# Patient Record
Sex: Male | Born: 1950 | Race: White | Hispanic: No | Marital: Single | Smoking: Never smoker
Health system: Southern US, Community
[De-identification: ages and names within clinical notes are randomized; demographics above are authoritative.]

## PROBLEM LIST (undated history)

## (undated) DIAGNOSIS — I10 Essential (primary) hypertension: Secondary | ICD-10-CM

## (undated) DIAGNOSIS — I739 Peripheral vascular disease, unspecified: Secondary | ICD-10-CM

## (undated) DIAGNOSIS — K219 Gastro-esophageal reflux disease without esophagitis: Secondary | ICD-10-CM

## (undated) DIAGNOSIS — R9430 Abnormal result of cardiovascular function study, unspecified: Secondary | ICD-10-CM

## (undated) DIAGNOSIS — M1990 Unspecified osteoarthritis, unspecified site: Secondary | ICD-10-CM

## (undated) DIAGNOSIS — R42 Dizziness and giddiness: Secondary | ICD-10-CM

## (undated) DIAGNOSIS — T7840XA Allergy, unspecified, initial encounter: Secondary | ICD-10-CM

## (undated) DIAGNOSIS — I493 Ventricular premature depolarization: Secondary | ICD-10-CM

## (undated) DIAGNOSIS — G709 Myoneural disorder, unspecified: Secondary | ICD-10-CM

## (undated) DIAGNOSIS — E785 Hyperlipidemia, unspecified: Secondary | ICD-10-CM

## (undated) DIAGNOSIS — M791 Myalgia, unspecified site: Secondary | ICD-10-CM

## (undated) DIAGNOSIS — IMO0002 Reserved for concepts with insufficient information to code with codable children: Secondary | ICD-10-CM

## (undated) DIAGNOSIS — R21 Rash and other nonspecific skin eruption: Secondary | ICD-10-CM

## (undated) DIAGNOSIS — A059 Bacterial foodborne intoxication, unspecified: Secondary | ICD-10-CM

## (undated) DIAGNOSIS — I358 Other nonrheumatic aortic valve disorders: Secondary | ICD-10-CM

## (undated) DIAGNOSIS — Z951 Presence of aortocoronary bypass graft: Secondary | ICD-10-CM

## (undated) DIAGNOSIS — I779 Disorder of arteries and arterioles, unspecified: Secondary | ICD-10-CM

## (undated) DIAGNOSIS — I2510 Atherosclerotic heart disease of native coronary artery without angina pectoris: Secondary | ICD-10-CM

## (undated) HISTORY — DX: Gastro-esophageal reflux disease without esophagitis: K21.9

## (undated) HISTORY — PX: POLYPECTOMY: SHX149

## (undated) HISTORY — DX: Peripheral vascular disease, unspecified: I73.9

## (undated) HISTORY — DX: Myoneural disorder, unspecified: G70.9

## (undated) HISTORY — PX: BACK SURGERY: SHX140

## (undated) HISTORY — DX: Reserved for concepts with insufficient information to code with codable children: IMO0002

## (undated) HISTORY — PX: COLONOSCOPY: SHX174

## (undated) HISTORY — PX: OTHER SURGICAL HISTORY: SHX169

## (undated) HISTORY — DX: Myalgia, unspecified site: M79.10

## (undated) HISTORY — DX: Disorder of arteries and arterioles, unspecified: I77.9

## (undated) HISTORY — PX: TIBIA FRACTURE SURGERY: SHX806

## (undated) HISTORY — DX: Hyperlipidemia, unspecified: E78.5

## (undated) HISTORY — DX: Essential (primary) hypertension: I10

## (undated) HISTORY — DX: Ventricular premature depolarization: I49.3

## (undated) HISTORY — DX: Allergy, unspecified, initial encounter: T78.40XA

## (undated) HISTORY — DX: Presence of aortocoronary bypass graft: Z95.1

## (undated) HISTORY — DX: Unspecified osteoarthritis, unspecified site: M19.90

## (undated) HISTORY — DX: Abnormal result of cardiovascular function study, unspecified: R94.30

## (undated) HISTORY — DX: Other nonrheumatic aortic valve disorders: I35.8

## (undated) HISTORY — DX: Dizziness and giddiness: R42

## (undated) HISTORY — DX: Bacterial foodborne intoxication, unspecified: A05.9

## (undated) HISTORY — DX: Atherosclerotic heart disease of native coronary artery without angina pectoris: I25.10

## (undated) HISTORY — DX: Rash and other nonspecific skin eruption: R21

---

## 1999-04-01 ENCOUNTER — Ambulatory Visit (HOSPITAL_COMMUNITY): Admission: RE | Admit: 1999-04-01 | Discharge: 1999-04-01 | Payer: Self-pay | Admitting: Internal Medicine

## 1999-04-01 ENCOUNTER — Encounter: Payer: Self-pay | Admitting: Internal Medicine

## 2000-09-07 ENCOUNTER — Encounter: Admission: RE | Admit: 2000-09-07 | Discharge: 2000-12-06 | Payer: Self-pay | Admitting: Anesthesiology

## 2003-01-22 ENCOUNTER — Inpatient Hospital Stay (HOSPITAL_COMMUNITY): Admission: AD | Admit: 2003-01-22 | Discharge: 2003-01-24 | Payer: Self-pay | Admitting: *Deleted

## 2003-01-22 ENCOUNTER — Encounter: Payer: Self-pay | Admitting: *Deleted

## 2004-09-18 ENCOUNTER — Ambulatory Visit: Payer: Self-pay | Admitting: Family Medicine

## 2005-04-09 ENCOUNTER — Ambulatory Visit: Payer: Self-pay | Admitting: Internal Medicine

## 2005-04-23 ENCOUNTER — Ambulatory Visit: Payer: Self-pay | Admitting: Internal Medicine

## 2005-06-04 ENCOUNTER — Ambulatory Visit: Payer: Self-pay | Admitting: Internal Medicine

## 2005-09-15 ENCOUNTER — Ambulatory Visit: Payer: Self-pay | Admitting: Internal Medicine

## 2005-11-04 ENCOUNTER — Ambulatory Visit: Payer: Self-pay | Admitting: Internal Medicine

## 2005-11-12 ENCOUNTER — Ambulatory Visit: Payer: Self-pay | Admitting: Internal Medicine

## 2005-12-04 ENCOUNTER — Ambulatory Visit: Payer: Self-pay | Admitting: Internal Medicine

## 2005-12-07 ENCOUNTER — Ambulatory Visit: Payer: Self-pay | Admitting: *Deleted

## 2005-12-29 ENCOUNTER — Ambulatory Visit: Payer: Self-pay

## 2006-01-20 ENCOUNTER — Ambulatory Visit: Payer: Self-pay | Admitting: Internal Medicine

## 2006-06-04 ENCOUNTER — Ambulatory Visit: Payer: Self-pay | Admitting: Internal Medicine

## 2006-06-04 LAB — CONVERTED CEMR LAB
Basophils Absolute: 0 10*3/uL (ref 0.0–0.1)
Basophils Relative: 0.3 % (ref 0.0–1.0)
Calcium: 9.6 mg/dL (ref 8.4–10.5)
Creatinine,U: 183.8 mg/dL
Eosinophil percent: 2.9 % (ref 0.0–5.0)
Free T4: 0.8 ng/dL — ABNORMAL LOW (ref 0.9–1.8)
HCT: 44.4 % (ref 39.0–52.0)
Hemoglobin: 15.2 g/dL (ref 13.0–17.0)
Hgb A1c MFr Bld: 5.9 % (ref 4.6–6.0)
Lymphocytes Relative: 26 % (ref 12.0–46.0)
MCHC: 34.3 g/dL (ref 30.0–36.0)
MCV: 90.5 fL (ref 78.0–100.0)
Magnesium: 2.1 mg/dL (ref 1.5–2.5)
Microalb Creat Ratio: 5.4 mg/g (ref 0.0–30.0)
Microalb, Ur: 1 mg/dL (ref 0.0–1.9)
Monocytes Absolute: 0.5 10*3/uL (ref 0.2–0.7)
Monocytes Relative: 10.6 % (ref 3.0–11.0)
Neutro Abs: 3.1 10*3/uL (ref 1.4–7.7)
Neutrophils Relative %: 60.2 % (ref 43.0–77.0)
Platelets: 180 10*3/uL (ref 150–400)
Potassium: 4.2 meq/L (ref 3.5–5.1)
RBC: 4.91 M/uL (ref 4.22–5.81)
RDW: 12.5 % (ref 11.5–14.6)
TSH: 1.42 microintl units/mL (ref 0.35–5.50)
WBC: 5 10*3/uL (ref 4.5–10.5)

## 2006-09-15 ENCOUNTER — Ambulatory Visit: Payer: Self-pay | Admitting: *Deleted

## 2006-09-15 LAB — CONVERTED CEMR LAB
ALT: 27 U/L (ref 0–40)
AST: 22 U/L (ref 0–37)
Albumin: 4.1 g/dL (ref 3.5–5.2)
Alkaline Phosphatase: 33 U/L — ABNORMAL LOW (ref 39–117)
BUN: 12 mg/dL (ref 6–23)
Bilirubin, Direct: 0.1 mg/dL (ref 0.0–0.3)
CO2: 27 meq/L (ref 19–32)
Calcium: 9.1 mg/dL (ref 8.4–10.5)
Chloride: 104 meq/L (ref 96–112)
Cholesterol: 227 mg/dL (ref 0–200)
Creatinine, Ser: 0.7 mg/dL (ref 0.4–1.5)
Direct LDL: 158.9 mg/dL
GFR calc Af Amer: 151 mL/min
GFR calc non Af Amer: 125 mL/min
Glucose, Bld: 123 mg/dL — ABNORMAL HIGH (ref 70–99)
HDL: 32.3 mg/dL — ABNORMAL LOW (ref >39.0)
Potassium: 4 meq/L (ref 3.5–5.1)
Sodium: 140 meq/L (ref 135–145)
Total Bilirubin: 1.2 mg/dL (ref 0.3–1.2)
Total CHOL/HDL Ratio: 7
Total Protein: 6.9 g/dL (ref 6.0–8.3)
Triglycerides: 178 mg/dL — ABNORMAL HIGH (ref 0–149)
VLDL: 36 mg/dL (ref 0–40)

## 2006-11-12 ENCOUNTER — Ambulatory Visit: Payer: Self-pay | Admitting: *Deleted

## 2006-11-12 LAB — CONVERTED CEMR LAB
ALT: 31 units/L (ref 0–40)
AST: 23 units/L (ref 0–37)
Albumin: 4.1 g/dL (ref 3.5–5.2)
Alkaline Phosphatase: 33 units/L — ABNORMAL LOW (ref 39–117)
Bilirubin, Direct: 0.1 mg/dL (ref 0.0–0.3)
Cholesterol: 149 mg/dL (ref 0–200)
HDL: 39 mg/dL (ref >39.0)
LDL Cholesterol: 89 mg/dL (ref 0–99)
Total Bilirubin: 1 mg/dL (ref 0.3–1.2)
Total CHOL/HDL Ratio: 3.8
Total Protein: 7.1 g/dL (ref 6.0–8.3)
Triglycerides: 107 mg/dL (ref 0–149)
VLDL: 21 mg/dL (ref 0–40)

## 2007-01-11 ENCOUNTER — Ambulatory Visit: Payer: Self-pay | Admitting: Cardiology

## 2007-08-24 ENCOUNTER — Ambulatory Visit: Payer: Self-pay | Admitting: Internal Medicine

## 2007-11-07 ENCOUNTER — Encounter: Payer: Self-pay | Admitting: Internal Medicine

## 2007-11-28 ENCOUNTER — Encounter: Payer: Self-pay | Admitting: Internal Medicine

## 2007-12-14 ENCOUNTER — Ambulatory Visit: Payer: Self-pay | Admitting: Internal Medicine

## 2007-12-14 LAB — CONVERTED CEMR LAB
ALT: 26 units/L (ref 0–53)
AST: 20 units/L (ref 0–37)
Albumin: 4.1 g/dL (ref 3.5–5.2)
Alkaline Phosphatase: 27 units/L — ABNORMAL LOW (ref 39–117)
BUN: 8 mg/dL (ref 6–23)
Bilirubin, Direct: 0.1 mg/dL (ref 0.0–0.3)
Cholesterol: 152 mg/dL (ref 0–200)
Creatinine, Ser: 0.8 mg/dL (ref 0.4–1.5)
Creatinine,U: 111.5 mg/dL
HDL: 30.3 mg/dL — ABNORMAL LOW (ref >39.0)
Hgb A1c MFr Bld: 6.5 % — ABNORMAL HIGH (ref 4.6–6.0)
LDL Cholesterol: 96 mg/dL (ref 0–99)
Microalb Creat Ratio: 9 mg/g (ref 0.0–30.0)
Microalb, Ur: 1 mg/dL (ref 0.0–1.9)
Potassium: 4.4 meq/L (ref 3.5–5.1)
Total Bilirubin: 0.8 mg/dL (ref 0.3–1.2)
Total CHOL/HDL Ratio: 5
Total Protein: 6.8 g/dL (ref 6.0–8.3)
Triglycerides: 127 mg/dL (ref 0–149)
VLDL: 25 mg/dL (ref 0–40)

## 2007-12-20 ENCOUNTER — Ambulatory Visit: Payer: Self-pay | Admitting: Internal Medicine

## 2007-12-20 LAB — CONVERTED CEMR LAB
Cholesterol, target level: 200 mg/dL
HDL goal, serum: 40 mg/dL
LDL Goal: 130 mg/dL

## 2007-12-22 ENCOUNTER — Encounter (INDEPENDENT_AMBULATORY_CARE_PROVIDER_SITE_OTHER): Payer: Self-pay | Admitting: *Deleted

## 2007-12-22 ENCOUNTER — Encounter: Payer: Self-pay | Admitting: Internal Medicine

## 2007-12-28 ENCOUNTER — Telehealth (INDEPENDENT_AMBULATORY_CARE_PROVIDER_SITE_OTHER): Payer: Self-pay | Admitting: *Deleted

## 2007-12-29 ENCOUNTER — Ambulatory Visit: Payer: Self-pay | Admitting: Cardiology

## 2008-01-24 ENCOUNTER — Ambulatory Visit: Payer: Self-pay

## 2008-01-24 ENCOUNTER — Encounter: Payer: Self-pay | Admitting: Cardiology

## 2008-01-26 ENCOUNTER — Telehealth (INDEPENDENT_AMBULATORY_CARE_PROVIDER_SITE_OTHER): Payer: Self-pay | Admitting: *Deleted

## 2008-01-30 ENCOUNTER — Ambulatory Visit: Payer: Self-pay | Admitting: Cardiology

## 2008-03-27 ENCOUNTER — Ambulatory Visit: Payer: Self-pay | Admitting: Internal Medicine

## 2008-03-27 DIAGNOSIS — E1165 Type 2 diabetes mellitus with hyperglycemia: Secondary | ICD-10-CM | POA: Insufficient documentation

## 2008-03-27 DIAGNOSIS — E1151 Type 2 diabetes mellitus with diabetic peripheral angiopathy without gangrene: Secondary | ICD-10-CM

## 2008-03-27 DIAGNOSIS — I10 Essential (primary) hypertension: Secondary | ICD-10-CM | POA: Insufficient documentation

## 2008-03-28 LAB — CONVERTED CEMR LAB
ALT: 27 units/L (ref 0–53)
AST: 19 units/L (ref 0–37)
Albumin: 4 g/dL (ref 3.5–5.2)
Alkaline Phosphatase: 32 units/L — ABNORMAL LOW (ref 39–117)
Bilirubin, Direct: 0.1 mg/dL (ref 0.0–0.3)
Cholesterol: 153 mg/dL (ref 0–200)
HDL: 27.8 mg/dL — ABNORMAL LOW (ref >39.0)
Hgb A1c MFr Bld: 5.6 % (ref 4.6–6.0)
LDL Cholesterol: 93 mg/dL (ref 0–99)
Total Bilirubin: 0.7 mg/dL (ref 0.3–1.2)
Total CHOL/HDL Ratio: 5.5
Total Protein: 6.9 g/dL (ref 6.0–8.3)
Triglycerides: 161 mg/dL — ABNORMAL HIGH (ref 0–149)
VLDL: 32 mg/dL (ref 0–40)

## 2008-09-12 ENCOUNTER — Ambulatory Visit: Payer: Self-pay | Admitting: Cardiology

## 2008-12-20 ENCOUNTER — Ambulatory Visit: Payer: Self-pay | Admitting: Family Medicine

## 2008-12-21 ENCOUNTER — Telehealth: Payer: Self-pay | Admitting: Internal Medicine

## 2009-02-14 ENCOUNTER — Telehealth: Payer: Self-pay | Admitting: Cardiology

## 2009-02-19 ENCOUNTER — Ambulatory Visit: Payer: Self-pay | Admitting: Internal Medicine

## 2009-04-09 ENCOUNTER — Telehealth (INDEPENDENT_AMBULATORY_CARE_PROVIDER_SITE_OTHER): Payer: Self-pay | Admitting: *Deleted

## 2009-04-15 ENCOUNTER — Encounter (INDEPENDENT_AMBULATORY_CARE_PROVIDER_SITE_OTHER): Payer: Self-pay | Admitting: *Deleted

## 2009-04-30 ENCOUNTER — Telehealth: Payer: Self-pay | Admitting: Cardiology

## 2009-05-01 ENCOUNTER — Ambulatory Visit: Payer: Self-pay | Admitting: Internal Medicine

## 2009-05-05 LAB — CONVERTED CEMR LAB
ALT: 25 units/L (ref 0–53)
AST: 19 units/L (ref 0–37)
Albumin: 4.2 g/dL (ref 3.5–5.2)
Alkaline Phosphatase: 37 units/L — ABNORMAL LOW (ref 39–117)
BUN: 10 mg/dL (ref 6–23)
Bilirubin, Direct: 0 mg/dL (ref 0.0–0.3)
CO2: 28 meq/L (ref 19–32)
Calcium: 9.2 mg/dL (ref 8.4–10.5)
Chloride: 107 meq/L (ref 96–112)
Cholesterol: 207 mg/dL — ABNORMAL HIGH (ref 0–200)
Creatinine, Ser: 0.7 mg/dL (ref 0.4–1.5)
Creatinine,U: 98.6 mg/dL
Direct LDL: 156.1 mg/dL
GFR calc non Af Amer: 123.62 mL/min (ref >60)
Glucose, Bld: 139 mg/dL — ABNORMAL HIGH (ref 70–99)
HDL: 33.1 mg/dL — ABNORMAL LOW (ref >39.00)
Hgb A1c MFr Bld: 5.5 % (ref 4.6–6.5)
Microalb Creat Ratio: 4.1 mg/g (ref 0.0–30.0)
Microalb, Ur: 0.4 mg/dL (ref 0.0–1.9)
Potassium: 4.4 meq/L (ref 3.5–5.1)
Sodium: 140 meq/L (ref 135–145)
Total Bilirubin: 0.8 mg/dL (ref 0.3–1.2)
Total CHOL/HDL Ratio: 6
Total Protein: 6.9 g/dL (ref 6.0–8.3)
Triglycerides: 155 mg/dL — ABNORMAL HIGH (ref 0.0–149.0)
VLDL: 31 mg/dL (ref 0.0–40.0)

## 2009-05-06 ENCOUNTER — Encounter (INDEPENDENT_AMBULATORY_CARE_PROVIDER_SITE_OTHER): Payer: Self-pay | Admitting: *Deleted

## 2009-05-08 ENCOUNTER — Encounter (INDEPENDENT_AMBULATORY_CARE_PROVIDER_SITE_OTHER): Payer: Self-pay | Admitting: *Deleted

## 2009-06-03 ENCOUNTER — Telehealth (INDEPENDENT_AMBULATORY_CARE_PROVIDER_SITE_OTHER): Payer: Self-pay | Admitting: *Deleted

## 2009-06-03 DIAGNOSIS — I493 Ventricular premature depolarization: Secondary | ICD-10-CM | POA: Insufficient documentation

## 2009-06-03 DIAGNOSIS — R0989 Other specified symptoms and signs involving the circulatory and respiratory systems: Secondary | ICD-10-CM | POA: Insufficient documentation

## 2009-06-03 HISTORY — DX: Ventricular premature depolarization: I49.3

## 2009-06-04 ENCOUNTER — Ambulatory Visit: Payer: Self-pay | Admitting: Cardiology

## 2009-06-12 ENCOUNTER — Ambulatory Visit: Payer: Self-pay

## 2009-06-12 ENCOUNTER — Encounter: Payer: Self-pay | Admitting: Cardiology

## 2009-07-30 ENCOUNTER — Ambulatory Visit: Payer: Self-pay | Admitting: Cardiology

## 2009-08-07 LAB — CONVERTED CEMR LAB
Cholesterol: 159 mg/dL (ref 0–200)
HDL: 33.5 mg/dL — ABNORMAL LOW (ref >39.00)
LDL Cholesterol: 104 mg/dL — ABNORMAL HIGH (ref 0–99)
Total CHOL/HDL Ratio: 5
Triglycerides: 107 mg/dL (ref 0.0–149.0)
VLDL: 21.4 mg/dL (ref 0.0–40.0)

## 2009-09-02 ENCOUNTER — Encounter (INDEPENDENT_AMBULATORY_CARE_PROVIDER_SITE_OTHER): Payer: Self-pay | Admitting: *Deleted

## 2009-09-04 ENCOUNTER — Ambulatory Visit: Payer: Self-pay | Admitting: Cardiology

## 2009-09-05 LAB — CONVERTED CEMR LAB
BUN: 11 mg/dL (ref 6–23)
CO2: 27 meq/L (ref 19–32)
Calcium: 9.1 mg/dL (ref 8.4–10.5)
Chloride: 104 meq/L (ref 96–112)
Creatinine, Ser: 0.8 mg/dL (ref 0.4–1.5)
GFR calc non Af Amer: 105.83 mL/min (ref >60)
Glucose, Bld: 136 mg/dL — ABNORMAL HIGH (ref 70–99)
Potassium: 4.1 meq/L (ref 3.5–5.1)
Sodium: 139 meq/L (ref 135–145)
Total CK: 193 units/L (ref 7–232)

## 2009-09-06 ENCOUNTER — Ambulatory Visit: Payer: Self-pay | Admitting: Internal Medicine

## 2009-09-23 ENCOUNTER — Encounter: Payer: Self-pay | Admitting: Cardiology

## 2009-11-07 ENCOUNTER — Encounter: Payer: Self-pay | Admitting: Cardiology

## 2009-11-08 ENCOUNTER — Ambulatory Visit: Payer: Self-pay | Admitting: Cardiology

## 2010-01-17 ENCOUNTER — Encounter: Payer: Self-pay | Admitting: Internal Medicine

## 2010-02-20 ENCOUNTER — Ambulatory Visit: Payer: Self-pay | Admitting: Internal Medicine

## 2010-02-24 ENCOUNTER — Telehealth: Payer: Self-pay | Admitting: Internal Medicine

## 2010-02-28 LAB — CONVERTED CEMR LAB
Hgb A1c MFr Bld: 6.6 % — ABNORMAL HIGH (ref 4.6–6.5)
Microalb Creat Ratio: 1.6 mg/g (ref 0.0–30.0)

## 2010-04-15 ENCOUNTER — Telehealth (INDEPENDENT_AMBULATORY_CARE_PROVIDER_SITE_OTHER): Payer: Self-pay | Admitting: *Deleted

## 2010-04-16 ENCOUNTER — Telehealth (INDEPENDENT_AMBULATORY_CARE_PROVIDER_SITE_OTHER): Payer: Self-pay | Admitting: *Deleted

## 2010-05-21 ENCOUNTER — Ambulatory Visit: Payer: Self-pay | Admitting: Internal Medicine

## 2010-05-21 DIAGNOSIS — M25569 Pain in unspecified knee: Secondary | ICD-10-CM | POA: Insufficient documentation

## 2010-09-02 NOTE — Assessment & Plan Note (Signed)
Summary: discuss med refills//lh   Vital Signs:  Patient profile:   60 year old male Height:      66 inches Weight:      251 pounds Temp:     98.7 degrees F oral Pulse rate:   60 / minute Resp:     16 per minute BP sitting:   122 / 74  (left arm) Cuff size:   large  Vitals Entered By: Shonna Chock (September 06, 2009 4:21 PM) CC: Yearly follow-up on meds Comments REVIEWED MED LIST, PATIENT AGREED DOSE AND INSTRUCTION CORRECT    Primary Care Provider:  Marga Melnick MD  CC:  Yearly follow-up on meds.  History of Present Illness: Dr Henrietta Hoover OV 09/04/2009  reviewed ; the myalgias are better off Crestor. He was experiencing fatigue, pain in toes & R hip, & sensation of skin rash w/o visable change... "my skin was crawling". CPK  was WNL @ 193. Chemistries were WNL except ; non fasting glucose was 136. FBS 120-130; no post meal glucoses. No hypoglycemia. No diet; no significant CVE. Weight  stable.  Allergies: 1)  ! 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  and courses between the pulmonary artery and aorta anterior to the aorta EF   55%.... catheterization.... January 24, 2003. /  60%.. echo... June, 2009 Aortic valve sclerosis.[name]. echo... June, 2009 PVCs   EKG... November, 2010 Carotid bruit... Doppler,  November, 2010...zero-39% bilateral disease GERD  Past Surgical History: coccyx bone removed pilonidal cystectomy back surgery stent 2004  Family History: Family History Hypertension father MI in 88s, bypass, htn pgf MI died age 65 mgf cancer site unknown paternal aunt diabetes mgm heart failure age 65  Review of Systems Eyes:   Denies blurring, double vision, and vision loss-both eyes; Last exam 5 months ago; no retinopathy. CV:  Denies chest pain or discomfort, leg cramps with exertion, lightheadness, near fainting, shortness of breath with exertion, swelling of feet, and swelling of hands. GI:  Denies abdominal pain, bloody stools, dark tarry stools, and indigestion; No colonscopy to date ; [facility] reviewed. Derm:  Denies lesion(s) and poor wound healing. Neuro:  Denies numbness and tingling. Endo:  Denies excessive hunger, excessive thirst, and excessive urination.  Physical Exam  General:  well-nourished,in no acute distress; alert,appropriate and cooperative throughout examination Head:  Normocephalic and atraumatic without obvious abnormalities. Pattern  alopecia . Neck:  No deformities, masses, or tenderness noted. Lungs:  Normal respiratory effort, chest expands symmetrically. Lungs are clear to auscultation, no crackles or wheezes. Heart:  normal rate, regular rhythm, no gallop, no rub, no JVD, no HJR, and grade 1 /6 systolic murmur.   Abdomen:  Bowel sounds positive,abdomen soft and non-tender without masses, organomegaly or hernias noted.  Dullness RUQ Rectal:  No external abnormalities noted. Normal sphincter tone. No rectal masses or tenderness. Genitalia:  Testes bilaterally descended without nodularity, tenderness or masses. No scrotal masses or lesions. No penis lesions or urethral discharge. Prostate:  Prostate gland firm and smooth, no enlargement, nodularity, tenderness, mass, asymmetry or induration. Pulses:  R and L carotid,radial,dorsalis pedis and posterior tibial pulses are full and equal bilaterally Extremities:  No clubbing, cyanosis, edema, or deformity noted. Good nail health Neurologic:  alert & oriented X3 and sensation intact to light touch  over feet.   Skin:  Intact without suspicious lesions or rashes Cervical Nodes:  No lymphadenopathy noted Axillary Nodes:  No palpable  lymphadenopathy Psych:  memory intact for recent and remote, normally interactive, and good eye contact.     Impression & Recommendations:  Problem # 1:  DIABETES MELLITUS, TYPE II (ICD-250.00)  His updated medication list for this problem includes:    Actoplus Met 15-850 Mg Tabs (Pioglitazone hcl-metformin hcl) .[name]... Take one tablet twice daily- appt due now    Ramipril 10 Mg Caps (Ramipril) .[name]... 1 by mouth once daily-appt due now    Aspirin 81 Mg Tbec (Aspirin) .[name]... Take one tablet by mouth daily    Glimepiride 1 Mg Tabs (Glimepiride) .[name]... 1 q am-appt due now  Problem # 2:  HYPERTENSION (ICD-401.9)  His updated medication list for this problem includes:    Ramipril 10 Mg Caps (Ramipril) .[name]... 1 by mouth once daily-appt due now  Problem # 3:  MUSCLE PAIN (ICD-729.1)  normal CPK   His updated medication list for this problem includes:    Aspirin 81 Mg Tbec (Aspirin) .[name]... Take one tablet by mouth daily  Problem # 4:  HYPERLIPIDEMIA (ICD-272.4) now off statins  Complete Medication List: 1)  Actoplus Met 15-850 Mg Tabs (Pioglitazone hcl-metformin hcl) .... Take one tablet twice daily- appt due now 2)  Plavix 75 Mg Tabs (Clopidogrel bisulfate) .[name].. 1 by mouth qd 3)  Ramipril 10 Mg Caps (Ramipril) .[name].. 1 by mouth once daily-appt due now 4)  Aspirin 81 Mg Tbec (Aspirin) .... Take one tablet by mouth daily 5)  Glimepiride 1 Mg Tabs (Glimepiride) .[name].. 1 q am-appt due now 6)  Freestyle Lite Test Strp (Glucose blood) .... Use as directed 7)  Fish Oil 1000 Mg Caps (Omega-3 fatty acids) .[name].. 1 cap once daily  Patient Instructions: 1)  Eat BROWN CARBS rather white carbs. Consume LESS THAN 40 grams of sugar/ day from foods & drinks with High Fructose Corn Syrup as #1,2 ,or # 3 on label. 2)  Please schedule a follow-up appointment in 4 months. 3)  HbgA1C prior to visit, ICD-9: 4)  Urine Microalbumin prior to visit, ICD-9: 5)  Schedule a colonoscopy  to help detect colon cancer if OK  with Dr Myrtis Ser. Prescriptions: ACTOPLUS MET 15-850 MG TABS (PIOGLITAZONE HCL-METFORMIN HCL) take one tablet twice daily- APPT DUE NOW  #60 Tablet x 3   Entered and Authorized by:   Marga Melnick MD   Signed by:   Marga Melnick MD on 09/06/2009   Method used:   [facility] to ...       [facility]  [address] [id number]       [address]       [place], [place]  [address]       Ph: [phone] or [phone]       Fax: [phone]   RxID:   [id number]

## 2010-09-02 NOTE — Progress Notes (Signed)
Summary: Refill Request  Phone Note Refill Request Message from:  Pharmacy  Refills Requested: Medication #1:  ACTOPLUS MET 15-850 MG TABS take one tablet twice daily [facility]    Method Requested: Fax to Mail Away Pharmacy Initial call taken by: Shonna Chock CMA,  April 15, 2010 8:52 AM    Prescriptions: ACTOPLUS MET 15-850 MG TABS (PIOGLITAZONE HCL-METFORMIN HCL) take one tablet twice daily  #180 x 1   Entered by:   Shonna Chock CMA   Authorized by:   Marga Melnick MD   Signed by:   Shonna Chock CMA on 04/15/2010   Method used:   Faxed to ...       [facility] (mail-order)             , [place]         Ph: [phone]       Fax: [phone]   RxID:   [id number]

## 2010-09-02 NOTE — Progress Notes (Signed)
Summary: Medicine  Phone Note Call from Patient Call back at Work Phone [phone]   Caller: Patient Summary of Call: Patient called and said that he talked with his insurance and they said it was ok to call in a 7 day supply [facility] the [facility] [address] and they will cover that while he waits for his mail order. He lives 45 minutes away from [place] and this would be eaiser then coming to get the samples.  Initial call taken by: Bodi Barban,  April 16, 2010 2:14 PM  Follow-up for Phone Call        Called in a 7 days supply to the pharmacy Follow-up by: Shonna Chock CMA,  April 16, 2010 2:27 PM

## 2010-09-02 NOTE — Assessment & Plan Note (Signed)
Summary: LEFT KNEE IS SORE X6 MONTHS/KN   Vital Signs:  Patient profile:   60 year old male Weight:      244 pounds BMI:     39.52 Temp:     98.7 degrees F oral Pulse rate:   72 / minute Resp:     15 per minute BP sitting:   132 / 80  (left arm) Cuff size:   regular  Vitals Entered By: Shonna Chock CMA (May 21, 2010 8:10 AM) CC: 1.) Left knee concerns   2.) Sinus Issues, Lower Extremity Joint pain, URI symptoms   Primary Care Provider:  Marga Melnick MD  CC:  1.) Left knee concerns   2.) Sinus Issues, Lower Extremity Joint pain, and URI symptoms.  History of Present Illness:    He has had lower Extremity discomfort &  weakness, present months - a year, progressively worse over several months. The patient reports giving away, occasional  popping, and weakness, but denies swelling, redness, locking, stiffness for >1 hr, and decreased ROM.  The pain is located in the left knee in the popliteal space.  This  began gradually and with no injury.  It  is described as constant and activity related, worse getting up from seated position.It resolves within 2-3 steps. He walked 3.5  miles last week w/o issues.There has been no evaluation to date.  The patient denies the following symptoms: fever, rash, photosensitivity, eye symptoms, diarrhea, and dysuria. Rx: none. PMH of fracture LLE; it is 3/8" shorter than R. URI Symptoms      The patient also presents with URI symptoms X 3 days, onset as ST.  The patient reports nasal congestion,  scant purulent nasal discharge,  and productive cough from PNDrainage (  light green-grey), but denies earache ,fever, headache or bilateral facial pain, tooth pain, and tender adenopathy.  Rx: none.  Current Medications (verified): 1)  Actoplus Met 15-850 Mg Tabs (Pioglitazone Hcl-Metformin Hcl) .... Take One Tablet Twice Daily 2)  Plavix 75 Mg  Tabs (Clopidogrel Bisulfate) .[name].. 1 By Mouth Qd 3)  Ramipril 10 Mg Caps (Ramipril) .[name].. 1 By Mouth Once Daily 4)   Aspirin 81 Mg Tbec (Aspirin) .... Take One Tablet By Mouth Daily 5)  Glimepiride 1 Mg  Tabs (Glimepiride) .[name].. 1 By Mouth Once Daily 6)  Freestyle Lite Test   Strp (Glucose Blood) .... Use As Directed 7)  Fish Oil 1000 Mg Caps (Omega-3 Fatty Acids) .[name].. 1 Cap Once Daily 8)  Simvastatin 20 Mg Tabs (Simvastatin) .... Take 1 Tablet At Bedtime  Allergies: 1)  ! Crestor (Rosuvastatin Calcium)  Physical Exam  General:  well-nourished,in no acute distress; alert,appropriate and cooperative throughout examination Ears:  External ear exam shows no significant lesions or deformities.  Otoscopic examination reveals clear canals, tympanic membranes are intact bilaterally without bulging, retraction, inflammation or discharge. Hearing is grossly normal bilaterally. Nose:  External nasal examination shows no deformity or inflammation. Nasal mucosa are pink and moist without lesions or exudates.Slight septal dislocation Mouth:  Oral mucosa and oropharynx without lesions or exudates.  Teeth in good repair. Lungs:  Normal respiratory effort, chest expands symmetrically. Lungs are clear to auscultation, no crackles or wheezes.Dry cough Heart:  normal rate, regular rhythm, no gallop, no rub, and grade 1 /6 systolic murmur.   Extremities:  No clubbing, cyanosis, edema, or deformity noted with normal full range of motion of  knees. Crepitus L knee w/o effusion Neurologic:  strength normal in all extremities and DTRs symmetrical  and normal.   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 R/O cartilage "feather"  being wedge His updated medication list for this problem includes:    Aspirin 81 Mg Tbec (Aspirin) .[name]... Take one tablet by mouth daily    Tramadol Hcl 50 Mg Tabs (Tramadol hcl) .[name]... 1 every 6 hrs as needed for pain  Orders: Orthopedic  Referral (Ortho)  Problem # 2:  URI (ICD-465.9)  His updated medication list for this problem includes:    Aspirin 81 Mg Tbec (Aspirin) .[name]... Take one tablet by mouth daily  Complete Medication List: 1)  Actoplus Met 15-850 Mg Tabs (Pioglitazone hcl-metformin hcl) .... Take one tablet twice daily 2)  Plavix 75 Mg Tabs (Clopidogrel bisulfate) .[name].. 1 by mouth qd 3)  Ramipril 10 Mg Caps (Ramipril) .[name].. 1 by mouth once daily 4)  Aspirin 81 Mg Tbec (Aspirin) .... Take one tablet by mouth daily 5)  Glimepiride 1 Mg Tabs (Glimepiride) .[name].. 1 by mouth once daily 6)  Freestyle Lite Test Strp (Glucose blood) .... Use as directed 7)  Fish Oil 1000 Mg Caps (Omega-3 fatty acids) .[name].. 1 cap once daily 8)  Simvastatin 20 Mg Tabs (Simvastatin) .... Take 1 tablet at bedtime 9)  Amoxicillin 500 Mg Caps (Amoxicillin) .[name].. 1 three times a day 10)  Tramadol Hcl 50 Mg Tabs (Tramadol hcl) .[name].. 1 every 6 hrs as needed for pain  Patient Instructions: 1)  Neti pot once daily until head congestion  resolved.  2)  Drink as much  NON dairy fluid as you can tolerate for the next few days. Prescriptions: TRAMADOL HCL 50 MG TABS (TRAMADOL HCL) 1 every 6 hrs as needed for pain  #30 x 0   Entered and Authorized by:   Marga Melnick MD   Signed by:   Marga Melnick MD on 05/21/2010   Method used:   [facility]ed to ...       [facility]  [address] [address] [id number]       [address]       [place], [place]  [address]       Ph: [phone] or [phone]       Fax: [phone]   RxID:   [id number] AMOXICILLIN 500 MG CAPS (AMOXICILLIN) 1 three times a day  #30 x 0   Entered and Authorized by:   Marga Melnick MD   Signed by:   Marga Melnick MD on 05/21/2010   Method used:   Faxed to ...       [facility]  [address] [id number]       [address]       [place], [place]  [address]       Ph: [phone] or [phone]       Fax: [phone]   RxID:    [id number]    Orders Added: 1)  Est. Patient Level IV [[address]] 2)  Orthopedic Referral [Ortho]

## 2010-09-02 NOTE — Progress Notes (Signed)
Summary: pt out of med - do we have samples  Phone Note Call from Patient Call back at Work Phone [phone] Call back at [phone]   Caller: Patient Summary of Call: patient needs to use mail order - he is out of med 2 days - do we have samples -ACTOPLUS MET 15-850 MG TABS - told patient we can call 30 day supply in to local pharmacy - he is concerned if they will approve - he is contacting ins  Initial call taken by: Okey Regal [place],  April 16, 2010 9:48 AM  Follow-up for Phone Call        Patient aware 2 weeks worth of samples placed at the front, patient awaiting mail order Follow-up by: Shonna Chock CMA,  April 16, 2010 12:58 PM

## 2010-09-02 NOTE — Progress Notes (Signed)
Summary: Request to speak with Dr  Phone Note Call from Patient Call back at Work Phone [phone]   Caller: Patient Summary of Call: Message left on VM: Patient would like for Dr.Hopper to call him, this is a personal call. Nothing relating to [place].   Shonna Chock CMA  February 24, 2010 1:19 PM   Follow-up for Phone Call        answered ; he was relaying information about an associate Follow-up by: Marga Melnick MD,  February 25, 2010 3:57 PM

## 2010-09-02 NOTE — Letter (Signed)
Summary: Generic Letter  [facility], Main Office  [address]   [place], [place] [address]   Phone: [phone]  Fax: [phone]        September 23, 2009 MRN: [id number]    [facility] [address] [place], [place]  [address]    Dear [name] [name],  Our records indicate it is time to check your cholesterol, since you changed medications in December.  Please call our office to schedule an appt for labwork.  Please remember it is a fasting lab.     Sincerely,  Meredith Staggers, RN Willa Rough, MD  This letter has been electronically signed by your physician.

## 2010-09-02 NOTE — Assessment & Plan Note (Signed)
Summary: 3 month/dmp  Medications Added FISH OIL 1000 MG CAPS (OMEGA-3 FATTY ACIDS) 1 cap once daily      Allergies Added: ! CRESTOR (ROSUVASTATIN CALCIUM)  Visit Type:  3 mo f/u Primary Provider:  Marga Melnick MD  CC:  CAD.  History of Present Illness: The patient is seen for follow up of coronary artery disease.  Is not having chest pain.  We had him on low dose of simvastatin and his LDL remained 100.  I suggested Crestor and he started.  In the past few days he's had diffuse muscle aches and pains.  Last night he stopped the Crestor.  Is not having any classic chest pain.  There's been no syncope or presyncope.  Problems Prior to Update: 1)  Muscle Aches and Pains  () 2)  Anomalous Right Coronary Artery  () 3)  Aortic Valve Sclerosis  (ICD-424.1) 4)  Premature Ventricular Contractions  (ICD-427.69) 5)  Carotid Bruit  (ICD-785.9) 6)  Rash-nonvesicular  (ICD-782.1) 7)  Hyperplasia Prostate Uns w/o Ur Obst & Oth Luts  (ICD-600.90) 8)  Hypertension  (ICD-401.9) 9)  Hyperlipidemia  (ICD-272.4) 10)  Diabetes Mellitus, Type II  (ICD-250.00) 11)  Neoplasm, Skin, Uncertain Behavior  (ICD-238.2) 12)  Unspecified Essential Hypertension  (ICD-401.9) 13)  Other and Unspecified Hyperlipidemia  (ICD-272.4) 14)  Cad  (ICD-414.00) 15)  Diab w/o Mention Comp Type Ii/uns Type Uncntrl  (ICD-250.02) 16)  Fh of Failure, Heart Nos  (ICD-428.9) 17)  Fh of Diabetes Mellitus, Family Hx  (ICD-V18.0) 18)  Fh of AMI Nos, Unspecified  (ICD-410.90)  Current Medications (verified): 1)  Actoplus Met 15-850 Mg Tabs (Pioglitazone Hcl-Metformin Hcl) .... Take One Tablet Twice Daily- Appt Due Now 2)  Plavix 75 Mg  Tabs (Clopidogrel Bisulfate) .[name].. 1 By Mouth Qd 3)  Ramipril 10 Mg Caps (Ramipril) .[name].. 1 By Mouth Once Daily-Appt Due Now 4)  Aspirin 81 Mg Tbec (Aspirin) .... Take One Tablet By Mouth Daily 5)  Glimepiride 1 Mg  Tabs (Glimepiride) .[name].. 1 Q Am-Appt Due Now 6)  Freestyle Lite Test   Strp  (Glucose Blood) .... Use As Directed 7)  Fish Oil 1000 Mg Caps (Omega-3 Fatty Acids) .[name].. 1 Cap Once Daily  Allergies (verified): 1)  ! 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  and courses between the pulmonary artery and aorta anterior to the aorta EF   55%.... catheterization.... January 24, 2003. /  60%.. echo... June, 2009 Aortic valve sclerosis.[name]. echo... June, 2009 PVCs   EKG... November, 2010 Carotid bruit... Doppler,  November, 2010...zero 39% bilateral disease DIAB W/O MENTION COMP TYPE II/UNS TYPE UNCNTRL (ICD-250.02) Family Hx of FAILURE, HEART NOS (ICD-428.9) Family Hx of DIABETES MELLITUS, FAMILY HX (ICD-V18.0) Family Hx of AMI NOS, UNSPECIFIED (ICD-410.90) GERD  Review of Systems       Patient denies fever, chills, headache, sweats, rash, change in vision, change in hearing, chest pain, cough, nausea vomiting, urinary symptoms.  All of the systems are reviewed and are negative.  Vital Signs:  Patient profile:   60 year old male Height:      66 inches Weight:      249 pounds BMI:  40.33 Pulse rate:   72 / minute Pulse rhythm:   regular BP sitting:   132 / 70  (left arm) Cuff size:   large  Vitals Entered By: Danielle Rankin, CMA (September 04, 2009 4:14 PM)  Physical Exam  General:  patient is overweight but stable in general. Eyes:  no xanthelasma. Neck:  no jugular venous distention. Lungs:  lungs are clear.  Respiratory effort is nonlabored. Heart:  cardiac exam reveals S1-S2.  No clicks or  significant murmurs. Abdomen:  abdomen is obese but soft. Extremities:  no peripheral edema. Psych:  patient is oriented to person time and place.  Affect is normal.   Impression & Recommendations:  Problem # 1:  HYPERTENSION (ICD-401.9)  His updated medication list for this problem includes:    Ramipril 10 Mg Caps (Ramipril) .[name]... 1 by mouth once daily-appt due now    Aspirin 81 Mg Tbec (Aspirin) .[name]... Take one tablet by mouth daily Blood pressure is controlled today.  No change in therapy.  Problem # 2:  CAD (ICD-414.00)  His updated medication list for this problem includes:    Plavix 75 Mg Tabs (Clopidogrel bisulfate) .[name]... 1 by mouth qd    Ramipril 10 Mg Caps (Ramipril) .[name]... 1 by mouth once daily-appt due now    Aspirin 81 Mg Tbec (Aspirin) .[name]... Take one tablet by mouth daily Coronary disease is stable.  No change in therapy.  Problem # 3:  * MUSCLE ACHES AND PAINS  This point we will do so this is from Crestor.  He took his last dose last night.  Chemistry and CPK will be checked today.we will then arrange to call the patient back to see how he is doing over the next few days.  In addition, we will renew his prescriptions for most of his medicines.  We will make it clear that he needs to see his primary physician also.  Patient Instructions: 1)  Your physician recommends that you schedule a follow-up appointment in: 2 months 2)  Your MD recomends for you to make an appintment to see your primary Physician  in the next 6 weeks 3)  Your physician recommends that you return for lab work GM:WNUUV BMRT, CPK.

## 2010-09-02 NOTE — Miscellaneous (Signed)
  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  and courses between the pulmonary artery and aorta anterior to the aorta EF   55%.... catheterization.... January 24, 2003. /  60%.. echo... June, 2009 Aortic valve sclerosis.[name]. echo... June, 2009 PVCs   EKG... November, 2010 Carotid bruit... Doppler,  November, 2010...zero 39% bilateral disease DIAB W/O MENTION COMP TYPE II/UNS TYPE UNCNTRL (ICD-250.02) Family Hx of FAILURE, HEART NOS (ICD-428.9) Family Hx of DIABETES MELLITUS, FAMILY HX (ICD-V18.0) Family Hx of AMI NOS, UNSPECIFIED (ICD-410.90) GERD (09/04/2009 12:30) Added new observation of PRIMARY MD: Marga Melnick MD (09/04/2009 12:30)       Past History:  Past Medical History:  food poisoning age 71 RASH-NONVESICULAR (ICD-782.1) HYPERPLASIA PROSTATE UNS W/O UR OBST & OTH LUTS (ICD-600.90) HYPERTENSION (ICD-401.9) HYPERLIPIDEMIA (ICD-272.4) DIABETES MELLITUS, TYPE II (ICD-250.00) NEOPLASM, SKIN, UNCERTAIN BEHAVIOR (ICD-238.2) CAD (ICD-414.00)...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  and courses between the pulmonary artery and aorta anterior to the aorta EF   55%.... catheterization.... January 24, 2003. /  60%.. echo... June, 2009 Aortic valve sclerosis.[name]. echo... June, 2009 PVCs   EKG... November, 2010 Carotid bruit... Doppler,  November, 2010...zero 39% bilateral disease DIAB W/O MENTION COMP TYPE II/UNS TYPE UNCNTRL  (ICD-250.02) Family Hx of FAILURE, HEART NOS (ICD-428.9) Family Hx of DIABETES MELLITUS, FAMILY HX (ICD-V18.0) Family Hx of AMI NOS, UNSPECIFIED (ICD-410.90) GERD

## 2010-09-02 NOTE — Miscellaneous (Signed)
  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  and courses between the pulmonary artery and aorta anterior to the aorta EF   55%.... catheterization.... January 24, 2003. /  60%.. echo... June, 2009 Aortic valve sclerosis.[name]. echo... June, 2009 PVCs   EKG... November, 2010 Carotid bruit... 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  and courses between the pulmonary artery and aorta anterior to the aorta EF   55%.... catheterization.... January 24, 2003. /  60%.. echo... June, 2009 Aortic valve sclerosis.[name]. echo... June, 2009 PVCs   EKG... November, 2010 Carotid bruit... Doppler,  November, 2010...zero-39% bilateral disease GERD Muscle aches...on crestor..09/04/2009.Marland KitchenMarland KitchenCPK 193  (7-232)

## 2010-09-02 NOTE — Medication Information (Signed)
Summary: Nonadherence with Statin/[employer]  Nonadherence with Statin/[employer]   Imported By: Lanelle Bal 02/28/2010 10:50:39  _____________________________________________________________________  External Attachment:    Type:   Image     Comment:   External Document

## 2010-09-02 NOTE — Letter (Signed)
Summary: Primary Care Appointment Letter  [place] [facility]  [address], [place] [address]   Phone: [phone]  Fax: [phone]    09/02/2009 MRN: [id number]  [facility] [address] [place], [place]  [address]  Dear Mr. Glynda Jaeger,   Your Primary Care Physician Marga Melnick MD has indicated that:    ___X___it is time to schedule an appointment as indicated in lab done in 04/2009 by Dr. Alwyn Ren to discuss diabetes medication please contact office to schedule appointment for office visit and fasting labs before further refills will be given.    _______you need to have lab work done.    _______you need to schedule an appointment discuss lab or test results.    _______you need to call to reschedule your appointment that is                       scheduled on _________.     Please call our office as soon as possible. Our phone number is [phone]          ___[phone]___. Our office is open 8a-5p, Monday through Friday.     Thank you,    [facility]

## 2010-09-02 NOTE — Assessment & Plan Note (Signed)
Summary: per check out/sf  Medications Added SIMVASTATIN 20 MG TABS (SIMVASTATIN) Take 1 tablet at bedtime      Allergies Added:   Visit Type:  Follow-up Primary Provider:  Marga Melnick MD  CC:  hypertension / hyperlipidemia.  History of Present Illness: The patient is seen for followup of hypertension and hyperlipidemia.  His blood pressure is controlled today.  He felt poorly on Crestor and was stopped.  He felt well after this.  CPK checked at that time was not abnormal.  Therefore he may have muscle discomfort from Crestor but was not causing any significant muscle abnormalities.  He tolerated simvastatin the past.  With known coronary disease I would like for him to be on a statin he agrees.  Current Medications (verified): 1)  Actoplus Met 15-850 Mg Tabs (Pioglitazone Hcl-Metformin Hcl) .... Take One Tablet Twice Daily- Appt Due Now 2)  Plavix 75 Mg  Tabs (Clopidogrel Bisulfate) .[name].. 1 By Mouth Qd 3)  Ramipril 10 Mg Caps (Ramipril) .[name].. 1 By Mouth Once Daily 4)  Aspirin 81 Mg Tbec (Aspirin) .... Take One Tablet By Mouth Daily 5)  Glimepiride 1 Mg  Tabs (Glimepiride) .[name].. 1 Q Am-Appt Due Now 6)  Freestyle Lite Test   Strp (Glucose Blood) .... Use As Directed 7)  Fish Oil 1000 Mg Caps (Omega-3 Fatty Acids) .[name].. 1 Cap Once Daily  Allergies (verified): 1)  ! 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  and courses between the pulmonary artery and aorta anterior to the aorta EF   55%.... catheterization.... January 24, 2003. /  60%.. echo... June, 2009 Aortic valve sclerosis.[name]. echo... June, 2009 PVCs   EKG... November, 2010 Carotid bruit...  Doppler,  November, 2010...zero-39% bilateral disease GERD Muscle aches...on crestor..09/04/2009.Marland KitchenMarland KitchenCPK 193  (7-232)  ... tolerated simvastatin in the past  Review of Systems       Patient denies fever, chills, headache, sweats, rash, change in vision, change in hearing, chest pain, cough, nausea vomiting, urinary symptoms.  All of the systems are reviewed and are negative.  Vital Signs:  Patient profile:   60 year old male Height:      66 inches Weight:      248 pounds BMI:     40.17 Pulse rate:   60 / minute BP sitting:   124 / 76  (left arm) Cuff size:   regular  Vitals Entered By: Hardin Negus, RMA (November 08, 2009 1:59 PM)  Physical Exam  General:  patient is stable. Head:  it is atraumatic. Ears:  no xanthelasma. Neck:  no jugular venous distention. Lungs:  lungs are clear respiratory effort is nonlabored. Heart:  cardiac exam reveals S1-S2.  No clicks or significant murmurs. Abdomen:  abdomen is soft Extremities:  no peripheral edema. Psych:  patient is oriented to person time and place.  Affect is normal.   Impression & Recommendations:  Problem # 1:  MUSCLE PAIN (ICD-729.1) Muscle pain is improved.  We will assume it was from Crestor but we are not sure.  Problem # 2:  HYPERTENSION (ICD-401.9)  His updated medication list for this problem includes:    Ramipril 10 Mg Caps (Ramipril) .[name]... 1 by mouth once daily    Aspirin 81 Mg Tbec (Aspirin) .[name]... Take one tablet by mouth  daily Blood pressure is well-controlled.  No change in therapy.  Problem # 3:  CAD (ICD-414.00)  His updated medication list for this problem includes:    Plavix 75 Mg Tabs (Clopidogrel bisulfate) .[name]... 1 by mouth qd    Ramipril 10 Mg Caps (Ramipril) .[name]... 1 by mouth once daily    Aspirin 81 Mg Tbec (Aspirin) .[name]... Take one tablet by mouth daily Coronary disease is stable.  We will put him back on simvastatin.  Patient Instructions: 1)  Your physician recommends that you schedule a  follow-up appointment in: 6 months 2)  Your physician recommends that you return for a FASTING lipid profile: in 2 months 3)  Your physician has recommended you make the following change in your medication:  Simvastatin 20 mg by mouth at bedtime Prescriptions: SIMVASTATIN 20 MG TABS (SIMVASTATIN) Take 1 tablet at bedtime  #30 x 6   Entered by:   Lisabeth Devoid RN   Authorized by:   Talitha Givens, MD, [facility]   Signed by:   Lisabeth Devoid RN on 11/08/2009   Method used:   Electronically to        [facility]  [address] [id number] (retail)       [address]       [place], [place]  [address]       Ph: [phone] or [phone]       Fax: [phone]   RxID:   [id number]

## 2010-09-23 ENCOUNTER — Encounter: Payer: Self-pay | Admitting: Internal Medicine

## 2010-09-23 ENCOUNTER — Other Ambulatory Visit: Payer: Self-pay | Admitting: Internal Medicine

## 2010-09-23 ENCOUNTER — Ambulatory Visit (INDEPENDENT_AMBULATORY_CARE_PROVIDER_SITE_OTHER): Payer: 59 | Admitting: Internal Medicine

## 2010-09-23 DIAGNOSIS — E119 Type 2 diabetes mellitus without complications: Secondary | ICD-10-CM

## 2010-09-23 DIAGNOSIS — I10 Essential (primary) hypertension: Secondary | ICD-10-CM

## 2010-09-24 LAB — MICROALBUMIN / CREATININE URINE RATIO: Microalb Creat Ratio: 1.7 mg/g (ref 0.0–30.0)

## 2010-09-24 LAB — BUN: BUN: 13 mg/dL (ref 6–23)

## 2010-09-30 NOTE — Assessment & Plan Note (Signed)
Summary: UNCONTROLLED BLD SUGAR/RH....   Vital Signs:  Patient profile:   60 year old male Weight:      249 pounds BMI:     40.33 Temp:     98.8 degrees F oral Pulse rate:   72 / minute Resp:     14 per minute BP sitting:   130 / 70  (left arm) Cuff size:   large  Vitals Entered By: Shonna Chock CMA (September 23, 2010 1:54 PM) CC: Uncontrolled Bloodsugars , Type 2 diabetes mellitus follow-up   Primary Care Provider:  Marga Melnick MD  CC:  Uncontrolled Bloodsugars  and Type 2 diabetes mellitus follow-up.  History of Present Illness:    FBS  was 227 last week ; he checked it because of weight loss of 1/2 # / day. Since then he has aggressively changed  diet & been using treadmil 35-60 min daily. He  reports polyuria,polyphagia,  polydipsia, and weight loss, but denies blurred vision, self managed hypoglycemia, and numbness of extremities.  The patient denies the following symptoms: neuropathic pain, chest pain, vomiting, orthostatic symptoms, poor wound healing, intermittent claudication, vision loss, and foot ulcer.  Since the last visit, the patient reports having had no eye care.  Potential risks of Actos in reference to bladder cancer.   Current Medications (verified): 1)  Actoplus Met 15-850 Mg Tabs (Pioglitazone Hcl-Metformin Hcl) .... Take One Tablet Twice Daily 2)  Plavix 75 Mg  Tabs (Clopidogrel Bisulfate) .[name].. 1 By Mouth Qd 3)  Ramipril 10 Mg Caps (Ramipril) .[name].. 1 By Mouth Once Daily 4)  Aspirin 81 Mg Tbec (Aspirin) .... Take One Tablet By Mouth Daily 5)  Glimepiride 1 Mg  Tabs (Glimepiride) .[name].. 1 By Mouth Once Daily **labs Due** 6)  Freestyle Lite Test   Strp (Glucose Blood) .... Use As Directed 7)  Fish Oil 1000 Mg Caps (Omega-3 Fatty Acids) .[name].. 1 Cap Once Daily 8)  Simvastatin 20 Mg Tabs (Simvastatin) .... Take 1 Tablet At Bedtime  Allergies: 1)  ! Crestor (Rosuvastatin Calcium)  Physical Exam  General:  well-nourished,in no acute distress; alert,appropriate  and cooperative throughout examination Lungs:  Normal respiratory effort, chest expands symmetrically. Lungs are clear to auscultation, no crackles or wheezes. Heart:  Normal rate and regular rhythm. S1 and S2 normal without gallop, murmur, click, rub .S4 with slurring Pulses:  R and L carotid,radial,dorsalis pedis and posterior tibial pulses are full and equal bilaterally Extremities:  No clubbing, cyanosis, edema. Good nail health Neurologic:  alert & oriented X3 and sensation intact to light touch over feet.   Skin:  Intact without suspicious lesions or rashes Psych:  memory intact for recent and remote, normally interactive, and good eye contact.     Impression & Recommendations:  Problem # 1:  DIABETES MELLITUS, TYPE II (ICD-250.00)  uncontrolled as per home readings; A1c to verify The following medications were removed from the medication list:    Actoplus Met 15-850 Mg Tabs (Pioglitazone hcl-metformin hcl) .[name]... Take one tablet twice daily His updated medication list for this problem includes:    Ramipril 10 Mg Caps (Ramipril) .[name]... 1 by mouth once daily    Aspirin 81 Mg Tbec (Aspirin) .[name]... Take one tablet by mouth daily    Glimepiride 1 Mg Tabs (Glimepiride) .[name]... 1 by mouth once daily    Janumet 50-1000 Mg Tabs (Sitagliptin-metformin hcl) .[name]... 1 two times a day with meals  Orders: Venipuncture ([address]) TLB-A1C / Hgb A1C (Glycohemoglobin) (83036-A1C) TLB-Microalbumin/Creat Ratio, Urine (82043-MALB)  Problem #  2:  HYPERTENSION (ICD-401.9) well controlled His updated medication list for this problem includes:    Ramipril 10 Mg Caps (Ramipril) .[name]... 1 by mouth once daily  Orders: TLB-Creatinine, Blood (82565-CREA) TLB-Potassium (K+) (84132-K) TLB-BUN (Urea Nitrogen) (84520-BUN)  Complete Medication List: 1)  Plavix 75 Mg Tabs (Clopidogrel bisulfate) .[name].. 1 by mouth qd 2)  Ramipril 10 Mg Caps (Ramipril) .[name].. 1 by mouth once daily 3)  Aspirin 81 Mg Tbec (Aspirin) ....  Take one tablet by mouth daily 4)  Glimepiride 1 Mg Tabs (Glimepiride) .[name].. 1 by mouth once daily **labs due** 5)  Freestyle Lite Test Strp (Glucose blood) .... Use as directed 6)  Fish Oil 1000 Mg Caps (Omega-3 fatty acids) .[name].. 1 cap once daily 7)  Simvastatin 20 Mg Tabs (Simvastatin) .... Take 1 tablet at bedtime 8)  Janumet 50-1000 Mg Tabs (Sitagliptin-metformin hcl) .[name].. 1 two times a day with meals  Patient Instructions: 1)  Check your blood sugars regularly. If your readings are usually above : 150 or below 90 you should contact our office. 2)  See your eye doctor yearly to check for diabetic eye damage. 3)  Check your feet each night for sore areas, calluses or signs of infection. Prescriptions: JANUMET 50-1000 MG TABS (SITAGLIPTIN-METFORMIN HCL) 1 two times a day with meals  #60 x 5   Entered and Authorized by:   Marga Melnick MD   Signed by:   Marga Melnick MD on 09/23/2010   Method used:   Print then Give to Patient   RxID:   [id number]    Orders Added: 1)  Est. Patient Level III [27253] 2)  Venipuncture [66440] 3)  TLB-Creatinine, Blood [82565-CREA] 4)  TLB-Potassium (K+) [84132-K] 5)  TLB-BUN (Urea Nitrogen) [84520-BUN] 6)  TLB-A1C / Hgb A1C (Glycohemoglobin) [83036-A1C] 7)  TLB-Microalbumin/Creat Ratio, Urine [82043-MALB]  Appended Document: UNCONTROLLED BLD SUGAR/RH....

## 2010-11-07 ENCOUNTER — Other Ambulatory Visit: Payer: Self-pay | Admitting: Internal Medicine

## 2010-12-16 NOTE — Assessment & Plan Note (Signed)
[facility]                            CARDIOLOGY OFFICE NOTE   NAME:[name] [name], [name] [name]                     MRN:          [id number]  DATE:09/12/2008                            DOB:          03/13/1951    [name] [name] is here for followup.  He has good LV function.  He does  have coronary artery disease.  He is not having any significant chest  pain.  He has a recent upper respiratory infection.  We will give him  some medications for this.  He is not allergic to any medication.  He is  going about full activities.   PAST MEDICAL HISTORY:   ALLERGIES:  No known drug allergies.   MEDICATIONS:  1. Aspirin.  2. Plavix.  3. Actoplus Met.  4. Altace.  5. Glimepiride.  6. Simvastatin 40.   OTHER MEDICAL PROBLEMS:  See the list on my note of Dec 29, 2007.   REVIEW OF SYSTEMS:  He has no GI or GU symptoms.  He has had no fevers  or chills.  There are no skin rashes.  No headaches, eye problems, or  dental problems.  Otherwise, his review of system is negative.   PHYSICAL EXAMINATION:  VITAL SIGNS:  Weight is up to 250.  Blood  pressure is 150/60 with a pulse of 84.  GENERAL:  The patient is oriented to person, time, and place.  Affect is  normal.  HEENT:  No xanthelasma.  He has normal extraocular motion.  NECK:  There are no carotid bruits.  There is no jugular venous  distention.  LUNGS:  Clear.  Respiratory effort is not labored.  He does have a  cough.  CARDIAC:  Stable.  ABDOMEN:  Obese, but soft.  EXTREMITIES:  He has no peripheral edema.   EKG shows normal sinus rhythm.  There is no QRS change.  He does have  PVCs.  He has seen these before.   Problems are listed on the note of Dec 29, 2007.  #6.  Elevated cholesterol.  He is on Zocor and needs follow up with Dr.  Alwyn Ren.  #7.  Coronary artery disease, status post Taxus stent in the past.  #8.  Obesity.  Weight loss and exercise clearly what he needs.     [name] Abed, MD,  [facility]  Electronically Signed    JDK/MedQ  DD: 09/12/2008  DT: 09/12/2008  Job #: [id number]   cc:   Titus Dubin. Alwyn Ren, MD,FACP,FCCP

## 2010-12-16 NOTE — Assessment & Plan Note (Signed)
[facility]                            CARDIOLOGY OFFICE NOTE   NAME:[name], [name] MECCA                     MRN:          [id number]  DATE:01/11/2007                            DOB:          06-26-1951    [name] [name] is 60. He is a [employer]. He has coronary disease and  he is the son of [name] [name], whom I also take care of. The  patient has a history of a right coronary arising from the left cusp. It  appeared to traverse between the aorta and the pulmonary artery with  high-grade stenosis and this was treated with a TAXUS stent  approximately three years ago. He has had no recurrent symptoms. He has  been quite stable. He is overweight and we talked about this at length  and I strongly urged him to begin Weight Watchers. He has a history of  hypertension, hyperlipidemia. He agreed to start simvastatin. In fact,  he started it and he was taking it at 40 every other day. We have talked  about this. He is not having any significant side effects and I have  strongly encouraged him to take it at 40 every day.   PAST MEDICAL HISTORY:   ALLERGIES:  No known drug allergies.   CURRENT MEDICATIONS:  1. Aspirin 81.  2. Plavix 75.  3. Actoplusmet 15/850 b.i.d.  4. Simvastatin 40 and he will now start to take this daily and will      check labs in six weeks.  5. Altace 2.5.   OTHER MEDICAL PROBLEMS:  See the list below.   REVIEW OF SYSTEMS:  He is feeling well and would like to lose weight.   PHYSICAL EXAMINATION:  Weight is 243 pounds. Blood pressure is 143/78  with a pulse of 61. The patient is oriented to person, time and place.  Affect is normal.  He has no xanthelasma. He has normal extraocular motion. There are no  carotid bruits. There is no jugular venous distention.  LUNGS:  Are clear.  CARDIAC: Reveals an S1, with an S2. There is a soft systolic murmur.  ABDOMEN: Soft, but obese.  He has no significant peripheral edema.   No  labs are done today. When he did start Zocor his LDL dropped from 158  to 89 on 40 every other day and his HDL went to 32 up to 39. He will  take 40 every day and will check his labs in six weeks.   PROBLEMS:  1. History of gastroesophageal reflux disease.  2. History of ejection fraction that in 2004, thought to have been      normalized. We will reassess this over time.  3. Mild aortic sclerosis historically and will consider a followup 2D      echo at a later date.  4. Elevated cholesterol. I feel strongly that he should take Zocor      every day. He was actually seen [facility] in the past.  5. Coronary artery disease, status post TAXUS stent to his right      coronary artery that  courses from his left cusp. He is not having      any significant chest pain.   He will take simvastatin 40 mg daily. Will recheck his labs with liver  in six weeks and I will see him back in six months.     [name] Abed, MD, [facility]  Electronically Signed    JDK/MedQ  DD: 01/11/2007  DT: 01/11/2007  Job #: [id number]

## 2010-12-16 NOTE — Assessment & Plan Note (Signed)
[facility]                            CARDIOLOGY OFFICE NOTE   NAME:[name] [name], [name] [name]                     MRN:          [id number]  DATE:01/30/2008                            DOB:          [birth year]    I saw [name] [name] on Dec 29, 2007, and we did a 2-D echo.  The study  shows that he has good LV function.  He does have aortic valve sclerosis  with very mild aortic insufficiency.  This is stable.  He does not need  further evaluations.  He needs to lose weight.  We have recommended  dietary counseling for him.  He was referred to [facility], they called him multiple times but this was not able to be  scheduled.  Today, I encouraged him to follow through on this.   He is not having any chest pain.   ALLERGIES:  No known drug allergies.   MEDICATIONS:  1. Aspirin.  2. Plavix.  3. Altace.  4. Glimepiride.  5. Simvastatin.   OTHER MEDICAL PROBLEMS:  See the prior note.   REVIEW OF SYSTEMS:  His review of systems is negative.   PHYSICAL EXAMINATION:  Blood pressure is 140/78 and pulse 65.  The patient is oriented to person, time, and place.  Affect is normal.  HEENT:  No xanthelasma.  He has normal extraocular motion.  There are no carotid bruits.  No jugular venous distention.  LUNGS:  Clear.  Respiratory effort is not labored.  CARDIAC:  S1 and S2.  There is a 2/6 systolic murmur.  ABDOMEN:  Soft.  He has no peripheral edema.   [name] [name] is stable.  There will be no change in his meds at this  time.  I have strongly encouraged him to follow up with nutritional  evaluation.  I will see him back in 6 months.     [name] Abed, MD, [facility]  Electronically Signed    JDK/MedQ  DD: 01/30/2008  DT: 01/31/2008  Job #: [id number]

## 2010-12-16 NOTE — Assessment & Plan Note (Signed)
[facility]                            CARDIOLOGY OFFICE NOTE   NAME:[name], [name] LEEDY                     MRN:          [id number]  DATE:12/29/2007                            DOB:          September 12, 1950    Mr. [name] [name] is here for followup.  He is a [employer] manager.  He has  history of right coronary arising from the left cusp.  There was a high-  grade stenosis, and it was treated with Taxus stent.  He has had no  recurrent symptoms.  He is overweight.  We have talked about this.  We  will get him a nutrition consult.  He has hypertension, hyperlipidemia.  He also has now been started on medications for potential diabetes by  Dr. Alwyn [name].  He clearly needs to lose weight.  I talked with him about  this.  He is hesitant to exercise excessively.  I told him that it was  safe for him to exercise with moderate exercise level, and he can  increase his heart rate for many hours a week on a regular basis.  He is  not having any chest pain.   PAST MEDICAL HISTORY:  Allergies:  No known drug allergies.   MEDICATIONS:  1. Aspirin 81.  2. Plavix 75.  3. Actoplus Met.  4. Touma.  5. Altace 10.  6. Glimepiride.  7. Simvastatin 40 daily.   OTHER MEDICAL PROBLEMS:  See the list below.   REVIEW OF SYSTEMS:  Other than the HPI, his review of systems is  negative.   PHYSICAL EXAM:  Weight is 246 pounds, up 3 pounds since last year.  Blood pressure is 164/90 with a pulse of 82.  His Altace dose was just  increased.  The patient is oriented to person, time and place.  Affect is normal.  HEENT:  Reveals no xanthelasma.  He has normal extraocular motion.  There are no carotid bruits.  There is no jugular venous distention.  LUNGS:  Clear.  Respiratory effort is not labored.  CARDIAC:  S1-S2.  There are no clicks.  He does have a 2/6 murmur.  ABDOMEN:  Obese.  He has no significant peripheral edema.  There are good distal pulses.  He has no major musculoskeletal  deformities.   EKG is done and reveals some PVCs.  There is no significant change in  his QRS.  He has brought me a copy of labs from Dr. Alwyn [name].  His LDL is  96.  He needs tighter control in this regard.  His HDL is 30.  Using  Niaspan would be appropriate.   PROBLEMS INCLUDE:  1. Gastroesophageal reflux disease.  2. History of ejection fraction that normalized over time.  3. History of mild aortic valve sclerosis.  4. History of mild aortic sclerosis.  5. It is time for a 2-D echocardiogram over the next few months.  6. Elevated cholesterol.  He finally is on Zocor daily.  7. Coronary disease post Taxus stent to the right coronary artery that      comes from his left cusp.  He is not having  any significant      symptoms.  The patient has a multitude of high risk factors.  It is      extremely important for him to lose weight.  This can help with his      blood pressure and his lipids and his diabetes.  I have discussed      this with him at length.  We will refer him for nutritional      counseling.  I will also plan to see him back in 6 weeks to check      his progress.  We will arrange for a 2-D echocardiogram to be done      before that visit.     [name] Abed, MD, [facility]  Electronically Signed    JDK/MedQ  DD: 12/29/2007  DT: 12/29/2007  Job #: [id number]

## 2010-12-19 NOTE — Cardiovascular Report (Signed)
NAME:  [name] [name], [name] [name]                        ACCOUNT NO.:  [id number]   MEDICAL RECORD NO.:  [id number]                   PATIENT TYPE:  INP   LOCATION:  6523                                 FACILITY:  [facility]   PHYSICIAN:  Learta Codding, M.D.                 DATE OF BIRTH:  Oct 12, 1950   DATE OF PROCEDURE:  DATE OF DISCHARGE:                              CARDIAC CATHETERIZATION   PROCEDURE PERFORMED:  1. Left heart catheterization.  2. Selective coronary angiography.  3. Ventriculography.   DIAGNOSES:  1. Single vessel coronary artery disease with high grade stenosis of distal     right coronary artery.  2. 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   INDICATIONS:  The patient is a 60 year old male with no prior history of  coronary artery disease.  The patient presented for routine physical  examination, reported chest pain to Titus Dubin. Alwyn Ren, M.D. [facility].  Subsequently, was referred for a Cardiolite stress study which demonstrated  inferior ischemia.  Now being referred for a diagnostic catheterization to  assess his coronary anatomy.   DESCRIPTION OF PROCEDURE:  After informed consent was obtained the patient  was brought to the catheterization laboratory.  The right groin was  sterilely prepped and draped.  A 6-French arterial sheath was inserted using  the modified Seldinger technique in the right femoral artery.  There was  significant tortuosity in the distal iliac arterial system.  All catheters  were exchanged over a guidewire.  Left coronary system was injected with a 6-  [place] JL4 catheter.  The right coronary artery appeared to be aberrant and  was taking off from the left coronary cusp and required an Amplatz I  catheter to engage.  Coronary angiography was then performed in various  projections using manual injection of contrast.  Ventriculography was also  performed with a 6-French angled pigtail catheter as well as an aortic root  injection.  At termination of procedure all catheters were removed.  The  sheath was left in place in anticipation for percutaneous coronary  intervention to the distal right coronary artery.  No complications were  encountered and adequate hemostasis was provided throughout the entire  procedure.   FINDINGS:  HEMODYNAMICS:  1. Left ventricular pressure 150/12 mmHg.  2. Aortic pressure 150/90 mmHg.   VENTRICULOGRAPHY  Ejection fraction approximately 55%.  No segmental wall motion  abnormalities.  No mitral regurgitation.   SELECTIVE CORONARY ANGIOGRAPHY  1. The left main coronary artery was a large caliber vessel with no evidence     of flow limiting disease.  2. Left anterior descending artery was a large caliber vessel coursing     across the apex.  There was no flow limiting disease.  Diagonal branches     were also free of flow limiting disease.  3. Circumflex coronary artery was, again, large caliber vessel without     evidence  for flow-limiting lesions.  4. The right coronary artery again was an aberrantly arising vessel.  Ostium     was at the level of the left coronary cusp.  In the RAO projection it was     clear that the right coronary artery was traversing anterior to the     aorta.  The right coronary artery and distal segment had a 99% stenosis.     The PDA and posterolateral branch were free of flow-limiting disease.   RECOMMENDATIONS:  The results were reviewed with Charlies Constable, M.D.  The  plan is to proceed with percutaneous coronary intervention of the right  coronary artery which is likely the culprit lesion.  However, note that also  the patient's aberrant coronary anatomy with the RCA traversing likely  between the pulmonary artery and aorta anterior to the aorta.  A repeat  exercise Cardiolite study should be obtained four to six weeks after  coronary intervention to make sure that the patient is not ischemic based on  the anomalous location of the right  coronary artery.                                               Learta Codding, M.D.    GED/MEDQ  D:  01/23/2003  T:  01/24/2003  Job:  [id number]  Titus Dubin. Alwyn Ren, M.D. [facility]   Bea Laura Graceann Congress, M.D.   cc:   Titus Dubin. Alwyn Ren, M.D. [facility]   Bea Laura Graceann Congress, M.D.

## 2010-12-19 NOTE — Procedures (Signed)
[facility]  Patient:    [name] [name], [name] [name]                     MRN: [id number] Proc. Date: 09/08/00 Adm. Date:  [id number] Attending:  Thyra Breed CC:         Lenard Galloway. Chaney Malling, M.D.  Titus Dubin. Alwyn Ren, M.D. [facility]   Procedure Report  DATE OF BIRTH:  04-13-1951  PROCEDURE:  Lumbar epidural steroid injection.  DIAGNOSIS:  Lumbar spondylosis with some bilateral lateral recess stenosis and a central canal stenosis at L5-S1 with small herniated disk.  HISTORY OF PRESENT ILLNESS:  [name] [name] is a very pleasant 60 year old who is sent to [place] by Dr. Chaney Malling for a series of lumbar epidural steroid injections. The patient stated that he was in chronic pain for many years. He was noted to have a coccyx problem when he was 20 and underwent a coccygeal procedure. Following this, he has had intermittent low back discomfort which has gotten progressively worse occurring more frequently at shorter intervals. Beginning about two years ago, it was affecting his golf game and he began to seek medical attention. By October/November, 2001, there was severe discomfort and chiropractic treatments which had helped previously were no longer of any benefit. He went to Dr. Alwyn Ren, his primary care physician, who advised him he had arthritis. He did not improve and he eventually was seen by Dr. Chaney Malling who treated him initially with Vioxx. The patient requested an MRI which was performed on August 17, 2000 which showed degenerative disk disease, facet joint arthropathy, and what was described as marked central canal stenosis at L5-S1 and a small herniated nucleus pulposus at L5-S1. He is sent for epidurals on this. He described his pain as a discomfort that radiates from the lumbosacral junction out into the hip regions bilaterally that is knife-like and improved by remaining in a stationary position but increased by changes after he has been in that position for a  while. He denied numbness or tingling, bowel or bladder incontinence, or weakness. He does have prominent gelling phenomenon. He has been treated with Vioxx which upsets his stomach, Flexeril which sedates him and hydrocodone which is inconsistent in hits affects.  CURRENT MEDICATIONS:  Vioxx, cyclobenzaprine, and hydrocodone.  ALLERGIES:  No known drug allergies.  FAMILY HISTORY:  Positive for coronary artery disease, diabetes, hypertension, osteoarthritis.  PAST SURGICAL HISTORY:  Significant for the coccyx surgery described above.  ACTIVE MEDICAL PROBLEMS:  None.  SOCIAL HISTORY:  The patient is a nonsmoker, nondrinker. 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  REVIEW OF SYSTEMS:  GENERAL:  Negative. HEAD:  Negative.  EYES:  Negative except for glasses. NOSE/MOUTH/THROAT:  Negative. EARS:  Negative. PULMONARY: Negative. CARDIOVASCULAR:  Negative. GI: Significant for dyspepsia as described above.  GU:  Negative.  MUSCULOSKELETAL:  See HPI. NEUROLOGIC: The patient had two episodes of what sounds like transient amnesia for which he has been worked up by Dr. Alwyn Ren.  HEMATOLOGIC:  Negative. ENDOCRINE: Negative. CUTANEOUS:  Negative. PSYCHIATRIC:  Negative. ALLERGY/IMMUNOLOGIC: He does have some recurrent sinus problems at times.  PHYSICAL EXAMINATION:  VITAL SIGNS:  Blood pressure 135/81, heart rate 65, respiratory rate 18, O2 saturations 96%, pain level is 4/10.  GENERAL:  This is a pleasant male in no acute distress.  HEENT:  Head was normocephalic, atraumatic. Eyes, extraocular movements intact. Conjunctivae and sclerae clear. Nose patent nares without discharge. Oropharynx was free of lesions.  NECK:  Supple without  lymphadenopathy. Carotids are 2+ and symmetric without bruits.  LUNGS:  Clear to auscultation and percussion.  HEART:  Regular rate and rhythm without murmur, gallop or rub.  ABDOMINAL/GENITALIA/RECTAL:  Not performed.  BACK:  Revealed  increased pain on hyperextension of the back with forward flexion. Straight leg raise signs were negative.  EXTREMITIES:  No cyanosis, clubbing or edema. Radial pulses were 2+ and symmetric as well as dorsalis pedis pulses.  NEUROLOGIC:  The patient was oriented x 4. Cranial nerves II-XII are grossly intact. Deep tendon reflexes were symmetric in the upper and lower extremity with downgoing toes. Motor was 5/5 with symmetric bulk and tone. Sensory was intact to pinprick, light touch, and vibratory sense. Coordination was grossly intact.  IMPRESSION: 1. Low back pain which is multifactorial on the basis of his facet joint    arthropathy, degenerative disk disease with underlying lumbar spinal    stenosis which is fairly asymptomatic. He also has a herniated disk.    I suspect that most of this discomfort is on the basis of his facet    joint arthropathy. 2. 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 Alwyn Ren.  DISPOSITION:  I discussed the potential risks, benefits and limitations of a lumbar epidural steroid injection in detail with the patient as well as reviewed the side effects of corticosteroids. The patient wishes to proceed.  DESCRIPTION OF PROCEDURE:  After informed consent was obtained, the patient was placed in the sitting position and monitored. The patients back was prepped with Betadine x 3. A skin wheal was raised at the L4-5 interspace with 1 percent lidocaine. A 20 gauge Tuohy needle was introduced to the lumbar epidural space to loss of resistance to preservative free normal saline. The depth was 7.5 cm. 80 mg of Medrol and 8 ml of preservative free normal saline was gently injected. The needle was flushed with preservative free normal saline and removed intact.  CONDITION POST PROCEDURE:  Stable.  DISCHARGE INSTRUCTIONS:  Resume previous diet. Limitations in activities per instruction sheet. Continue on current medications. Follow-up with me in  1  week for repeat injection. The patient was advised that if he did not respond to the first two injections, he may be a candidate for facet joint injections and/or the use of opiates on a time contingent basis. DD:  09/08/00 TD:  09/09/00 Job: [id number]

## 2010-12-19 NOTE — Assessment & Plan Note (Signed)
[facility]                            CARDIOLOGY OFFICE NOTE   NAME:[name], [name] TRAHAN                     MRN:          [id number]  DATE:09/15/2006                            DOB:          October 06, 1950    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  This  was stented with a TAXUS stent approximately 3 years ago.  Patient has  had no recurrent symptoms.  He has been unable to tolerate Toprol.  He  notes that he was being started back on a statin, Caduet 540, however,  he states he has never taken this.  He is having no cardiac symptoms.  He is overweight at 241.  He works full time.  History of hypertension,  hyperlipidemia.  The patient states his blood pressure is always in the  120s systolic taken at home.   Blood pressure 152/78, initially a systolic of 140 on palpation by me.  Pulse 94.  Normal sinus rhythm, although an EKG revealed PVCs with runs  of trigemini.  GENERAL APPEARANCE:  Obese.  JVP is not elevated.  Carotid pulses bilaterally equal without bruits.  LUNGS:  Clear.  CARDIAC EXAM:  Reveals no murmur, gallop or rub.  ABDOMINAL EXAM:  Liver, spleen and kidneys not palpable.  No masses.  EXTREMITIES:  Normal.  EKG reveals normal sinus rhythm with  PVCs and runs of trigemini.   IMPRESSION:  Diagnosis above.  He would like not to increase his  medication at this time.  I have suggested to him that he should take a  statin, although, he apparently did not tolerate the Lipitor in the  past.   We are going to check a lipid, LFT and BMP today, then we will try  simvastatin.   I should note that he had a negative stress Myoview 9 months ago, mild  hypokinesis of the apex.   We will have him return in 4  months for continued Cardiology followup.   He very much wants to see Dr. Myrtis Ser, who also cares for his father,  [name], [name]     Cecil Cranker, MD, [facility]  Electronically Signed    EJL/MedQ  DD: 09/15/2006  DT: 09/15/2006  Job #: [id number]

## 2010-12-19 NOTE — Cardiovascular Report (Signed)
NAME:  [name] [name], [name] [name]                        ACCOUNT NO.:  [id number]   MEDICAL RECORD NO.:  [id number]                   PATIENT TYPE:  INP   LOCATION:  6523                                 FACILITY:  [facility]   PHYSICIAN:  Charlies Constable, M.D.                  DATE OF BIRTH:  22-Mar-1951   DATE OF PROCEDURE:  01/23/2003  DATE OF DISCHARGE:                              CARDIAC CATHETERIZATION   CLINICAL HISTORY:  Mr. Macdonnell is 60 years old and was admitted to the  hospital yesterday with symptoms of exertional chest pain and abnormal  Cardiolite.  He underwent diagnostic angiography today by Dr. Andee Lineman, who  found a tight lesion in the distal right coronary artery.  The right  coronary artery arose anomalously from the left coronary cusp in the  anterior aorta.  We made a decision to proceed with coronary intervention on  the lesion in the distal right coronary artery.   PROCEDURE:  The procedure was performed via the right femoral artery.  We  tried multiple guiding catheters, but were finally able to engage the right  coronary artery with its anomalous origin with an AL2 6-French guiding  catheter with side holes.  With the help of the wire we were able to seat  the catheter better.  We advanced first a Luge wire and then a PT2 extra  support wire down the right coronary artery across the lesion.  We pre  dilated with a 3.25 x 20 mm Maverick performing several inflations to 12  atmospheres for 30 seconds.  We then deployed a 3.0 x 24 mm TAXUS stent.  We  had difficulty navigating the stent out of the guiding catheter and down the  vessel, but were finally able to accomplish this.  We deployed this with one  inflation of 15 atmospheres for 30 seconds.  The procedure was a long  procedure because of difficulty with guiding catheter seating but the  patient tolerated the procedure well and left the laboratory in satisfactory  condition.   RESULTS:  Initially, the stenosis in the  distal right coronary artery was  estimated at 95%.  Following stenting this improved to less than 10%.   CONCLUSIONS:  Successful percutaneous coronary intervention (PCI)  of the  lesion in the distal right coronary artery with a TAXUS stent with  improvement in the __________ narrowing from 95% to less than 10%.   DISPOSITION:  The patient returned to the PACU for further observation.                                               Charlies Constable, M.D.    BB/MEDQ  D:  01/23/2003  T:  01/24/2003  Job:  [id number]  Titus Dubin. Alwyn Ren, M.D. [facility]   E.  Graceann Congress, M.D.   cc:   Titus Dubin. Alwyn Ren, M.D. [facility]   Bea Laura Graceann Congress, M.D.

## 2010-12-19 NOTE — Discharge Summary (Signed)
NAME:  [name] [name], [name] [name]                        ACCOUNT NO.:  [id number]   MEDICAL RECORD NO.:  [id number]                   [name] TYPE:  INP   LOCATION:  6523                                 FACILITY:  [facility]   PHYSICIAN:  Cecil Cranker, M.D.             DATE OF BIRTH:  November 08, 1950   DATE OF ADMISSION:  01/22/2003  DATE OF DISCHARGE:  01/24/2003                           DISCHARGE SUMMARY - REFERRING   HISTORY:  [name] [name] is a 60 year old white male who was initially referred  by Dr. Alwyn Ren for a stress Cardiolite for evaluation of intermittent chest  discomfort over [name] proceeding three months. His Cardiolite was positive for  inferior ischemia with an EF 39%. Thus he was admitted for cardiac  catheterization.   PAST MEDICAL HISTORY:  Notable for back surgery x2 and family history.   LABORATORY DATA:  Admission sodium 139, potassium 38, BUN 9, creatinine 0.8,  glucose 178. Normal LFTs. PT 13.7, INR 1.0, PTT 31. H&H 14.3 and 40.0,  normal indices. Platelets 158, WBCs 7.4. TSH 1.442. CKs and troponins were  negative for myocardial infarction. Fasting cholesterol showed a total  cholesterol of 216, triglycerides 274, HDL 35, LDL 126, VLDL 55. Chest x-ray  showed low volumes, right basilar atelectasis. EKG showed sinus bradycardia,  nonspecific ST-T wave changes.   HOSPITAL COURSE:  [name] [name] was admitted to [facility]. He is  placed on IV Heparin per pharmacy protocol. Cardiac catheterization was  performed on June 22 to evaluate his symptoms and abnormal Cardiolite. Dr.  Andee Lineman performed [name] procedure without difficulty. This revealed an EF of  55% and a 99% distal RCA lesion. It was noted that he had an abhorrent RCA  on [name] left coronary cuff. After review with Dr. Juanda Chance, Dr. Juanda Chance  performed stenting reducing [name] distal 95% lesion in [name] RCA to less than  10% utilizing a Taxus stent. She is to remain on bedrest. [name] [name] was  doing well. [name]  catheterization site was intact. For his hyperlipidemia he  was placed on a statin and recommended Plavix for at least six months. On  June 23 Dr. Corinda Gubler reviewed [name] [name] and stated that he could be  discharged home and he asked GI to evaluate because of dysphagia and Nexium  was added to his medical regimen. Dr. Yetta Barre suggested adding an ACE  inhibitor. GI saw him in consultation on June 23. [name] [name] was seen by  Brett Canales and Dr. Lina Sar. After being evaluated by GI Dr. Lina Sar felt that [name] [name] could be discharged home with outpatient follow-  up.   DISCHARGE DIAGNOSES:  1. Unstable angina status post angioplasty and stenting.  2. Distal RCA as previously described.  3. Hyperlipidemia.  4. Dysphagia/esophagitis.   DISPOSITION:  [name] [name] is discharged home. He received new prescriptions  for Nexium 40 mg daily, Plavix 75 mg daily for six months, coated aspirin  225  mg daily, Zocor 20 mg q.h.s., nitroglycerin as needed, Altace 5 mg  daily. He is advised no lifting, driving, sexual activity or heavy exertion  for two days. Maintain low-salt, fat, cholesterol diet. If he has any  problems with his catheterization site he was asked to call. He will need  fasting lipids and LFTs in approximately 6-8 weeks. He will see Dr. Glennon Hamilton on July 14 at 3 p.m. He will see Dr. Lina Sar on July 4, at 8:45  a.m.  Phone numbers were provided. At follow-up appointment with Dr. Corinda Gubler  arrangements  for his fasting lipids and LFTs should be made. If [name] [name] has not been  contacted by cardiac rehab arrangements for this should be made as [name]  [name] did not see cardiac rehab prior to discharge. It is also noted that  [name] [name] is from [place], however, he wishes to follow up in  [place].     Joellyn Rued, P.A. [facility]                    E. Graceann Congress, M.D.    EW/MEDQ  D:  01/24/2003  T:  01/25/2003  Job:  [id number]   cc:   Lina Sar, M.D. [name]   Bea Laura  Graceann Congress, M.D.   Titus Dubin. Alwyn Ren, M.D. [facility]    cc:   Lina Sar, M.D. [facility]   Bea Laura Graceann Congress, M.D.   Titus Dubin. Alwyn Ren, M.D. [facility]

## 2010-12-19 NOTE — Procedures (Signed)
[facility]  Patient:    [name] [name], [name] [name]                     MRN: [id number] Proc. Date: 09/15/00 Adm. Date:  [id number] Attending:  Thyra Breed CC:         Lenard Galloway. Chaney Malling, M.D.   Procedure Report  PROCEDURE:  Lumbar epidural steroid injection.  DIAGNOSIS:  Lumbar spondylosis with bilateral recessed stenosis and central canal stenosis at L5-S1 with underlying small herniated disk.  INTERVAL HISTORY:  The patients noted some overall improvement with the first injection. He is taking much less medication and he rates his pain at 3/10.  PHYSICAL EXAMINATION:  Blood pressure 141/73, heart rate 74, respiratory rate 20, O2 saturations 97% and pain level is 3/10. His back shows good healing from his previous injection site.  DESCRIPTION OF PROCEDURE:  After informed consent was obtained, the patient was placed in the sitting position and monitored. The patients back was prepped with Betadine x 3. A skin wheal was raised at the L4-5 interspace with 1 percent lidocaine. A 20 gauge Tuohy needle was introduced to the lumbar epidural space to loss of resistance to preservative free normal saline. There was no cerebrospinal fluid nor blood. 40 mg of Medrol and 6 ml of preservative free normal saline was gently injected. The needle was flushed with preservative free normal saline and removed intact.  CONDITION POST PROCEDURE:  Stable.  DISCHARGE INSTRUCTIONS:  Resume previous diet. Limitations in activities per instruction sheet as outlined by my assistant today. Continue on current medications. I gave him a prescription for hydrocodone 5/500 one p.o. q. 6h p.r.n. #25 with no refill. Follow-up with me in one week for a third injection. DD:  09/15/00 TD:  09/15/00 Job: [id number]

## 2010-12-19 NOTE — Letter (Signed)
June 11, 2006    E. Graceann Congress, MD, [facility]  [address]  [address]  [place], [place] [address]   RE:  [name] [name], [name] [name]  MRN:  [id number]  /  DOB:  07-Oct-1950   Dear [name],   I saw [name] [name] on November 2 with headache and jaw pain. This was also  associated with some purulent sputum production. His otolaryngologic exam  was unremarkable and he had no lymphadenopathy. I did prescribe amoxicillin  and Mucinex. The differential included a dental source of his pain versus  zoster without rash.Dental followup was recommended also. By the time I had  seen him, his headache had resolved and his neurologic exam was normal.   His EKG did reveal bigeminy without ischemic change. He  has had unifocal  PVCs in the past.   Normal and negative studies included CBC and differential, potassium,  calcium, magnesium, and thyroid function tests. His hemoglobin A1c is at 5.9  indicating an average sugar of 131 and 18% risk. This is adequate diabetic  control but diet restriction would be encouraged.   I asked him to repeat the A1c with fasting lipids, GOT and GPT in March.   At this time, no other changes made in his 81 mg aspirin, Plavix, Actos plus  met 15/850 twice a day and Altace 5 mg daily. Please continue your cardiac  evaluations on the frequency you feel is clinically indicated; thank you for  your role in the care of this gentleman.    Sincerely,      Titus Dubin. Alwyn Ren, MD,FACP,FCCP  Electronically Signed    [facility]/MedQ  DD: 06/11/2006  DT: 06/11/2006  Job #: [id number]

## 2010-12-19 NOTE — H&P (Signed)
NAME:  [name] [name], [name] [name]                        ACCOUNT NO.:  [id number]   MEDICAL RECORD NO.:  [id number]                   PATIENT TYPE:  INP   LOCATION:  6523                                 FACILITY:  [facility]   PHYSICIAN:  Tereso Newcomer, P.A.                  DATE OF BIRTH:  10/04/1950   DATE OF ADMISSION:  01/22/2003  DATE OF DISCHARGE:                                HISTORY & PHYSICAL   CHIEF COMPLAINT:  Chest pain.   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  His stress Cardiolite is positive for inferior  ischemia and his EF is 39% by gated images.  He is referred to be seen for  further evaluation.   The patient reports a three-month history of chest discomfort.  Three months  ago, he felt substernal chest heaviness with walking that lasted for  approximately 30 minutes.  Since then, he has had substernal chest heaviness  off and on.  Sometimes it is worse with walking, causing him to stop.  Sometimes he feels it at rest.  He denies any dyspnea, dyspnea on exertion,  nausea, diaphoresis, radiation to his arm or jaw.  He denies any PND,  orthopnea, or syncope.  He denies any fatigue.  The initial episode of chest  pain was accompanied by a blood pressure of 200/100.  He constantly feels  like he needs to belch.  Sometimes his pain is worse with meals.   PAST MEDICAL HISTORY:  Status post back surgery x2.  There is no history of  hypertension, diabetes, hyperlipidemia or thyroid disease.   MEDICATIONS:  None.   ALLERGIES:  No known drug allergies.   SOCIAL HISTORY:  The patient denies any cigarette use.  He denies any  alcohol abuse.  He is a Emergency planning/management officer.  He is divorced with three adult  children and three grandchildren.   FAMILY HISTORY:  Positive for CAD.  His father had an MI in his mid 60s and  is status post CABG, and his paternal  grandfather had an MI at age 42.   REVIEW OF SYSTEMS:  Please see the HPI.  He has had a recent cough with  green sputum that lasted for about a week and has resolved.  He denies any  melena, hematochezia, dysuria, hematuria, rashes, arthralgias, myalgias.  He  denies any history of peptic ulcer disease or bleeding ulcers.   PHYSICAL EXAM:  He is a well-nourished, well-developed male in no acute  distress.  Blood pressure is 136/70, pulse 78, weight 244 pounds.  HEENT:  Unremarkable.  NECK:  Positive for a right carotid bruit.  LUNGS:  Clear to auscultation bilaterally.  CARDIAC:  Normal S1, S2, regular rate and rhythm.  ABDOMEN:  Soft, nontender.  No hepatosplenomegaly noted.  No  bruits.  EXTREMITIES:  Without edema.  VASCULAR:  With 2+ femoral, dorsalis pedis, and posterior tibialis pulses  bilaterally.  No femoral artery bruits noted.  NEUROLOGIC:  Grossly intact.   Electrocardiogram from his stress test today reveals normal sinus rhythm,  heart rate of 79, frequent PVCs, no acute changes.  During exercise, he did  have ST depression in the inferior leads and lead V5 and 6.   IMPRESSION:  1. Chest pain.     a. Abnormal Cardiolite.     b. Ejection fraction of 39%.  2. Family history of coronary artery disease.  3. Status post back surgery x2.  4. Right carotid bruit.   PLAN:  The patient will be admitted today directly to [facility].  We will plan on cardiac catheterization tomorrow with Dr. Andee Lineman.  We will  check a fasting lipid panel to further stratify his risk.  He will be placed  on low-dose beta-blocker, heparin, and aspirin.  We will check a carotid  ultrasound to evaluate his right carotid bruit.                                                Tereso Newcomer, P.A.    SW/MEDQ  D:  01/22/2003  T:  01/23/2003  Job:  [id number]   cc:   Dr. Clearance Coots    cc:   Dr. Clearance Coots

## 2011-01-05 ENCOUNTER — Telehealth: Payer: Self-pay | Admitting: Internal Medicine

## 2011-01-05 MED ORDER — GLIMEPIRIDE 1 MG PO TABS: ORAL_TABLET | ORAL | Status: DC

## 2011-01-05 NOTE — Telephone Encounter (Signed)
meds sent in

## 2011-01-06 ENCOUNTER — Other Ambulatory Visit (INDEPENDENT_AMBULATORY_CARE_PROVIDER_SITE_OTHER): Payer: 59

## 2011-01-06 DIAGNOSIS — IMO0001 Reserved for inherently not codable concepts without codable children: Secondary | ICD-10-CM

## 2011-01-06 NOTE — Progress Notes (Signed)
Labs only

## 2011-01-07 ENCOUNTER — Ambulatory Visit (INDEPENDENT_AMBULATORY_CARE_PROVIDER_SITE_OTHER): Payer: 59 | Admitting: Internal Medicine

## 2011-01-07 ENCOUNTER — Encounter: Payer: Self-pay | Admitting: Internal Medicine

## 2011-01-07 DIAGNOSIS — IMO0001 Reserved for inherently not codable concepts without codable children: Secondary | ICD-10-CM

## 2011-01-07 DIAGNOSIS — E785 Hyperlipidemia, unspecified: Secondary | ICD-10-CM

## 2011-01-07 DIAGNOSIS — E119 Type 2 diabetes mellitus without complications: Secondary | ICD-10-CM

## 2011-01-07 NOTE — Progress Notes (Signed)
Subjective:    Patient ID: [name] [name], male    DOB: June 16, 1951, 60 y.o.   MRN: [id number]  HPI Diabetes status assessment: Fasting or morning glucose range:  138-190 or average :  160  . Highest glucose 2 hours after any meal:  Not checked. Hypoglycemia :  no .                                                     Excess thirst :no;  Excess hunger:  no ;  Excess urination:  no.                                  Lightheadedness with standing:  no. Chest pain:  no ; Palpitations :no ;  Pain in  calves with walking:  No.                                                                                                                                Non healing skin  ulcers or sores,especially over the feet:  no. Numbness or tingling or burning in feet : no .                                                                                                                                              Significant change in  Weight : loss 20# with TLC. Vision changes : no  .                                                                    Exercise : walking 3X/ week . Nutrition/diet:  Low carb. Medication compliance : missing Simvastatin , concerned about muscle injury. Medication adverse  Effects:  See above . Eye exam : overdue. Foot care : no.  A1c/ urine microalbumin monitor:  6.9% , down from 8.1%; risks reviewed  Review of Systems Note : ROS reveals he is describing L popliteal area, R shoulder      Objective:   Physical Exam Gen.: Healthy and well-nourished in appearance. Alert, appropriate and cooperative throughout exam. Eyes: No corneal or conjunctival inflammation noted. Vision grossly normal. Ears: External  ear exam reveals no significant lesions or deformities. Canals clear .TMs normal. Hearing is grossly normal bilaterally.  Lungs: Normal respiratory effort; chest expands symmetrically. Lungs are clear to auscultation without rales, wheezes, or increased work of  breathing. Heart: Normal rate and rhythm. Normal S1 and S2. No gallop, click, or rub. Grade 1/6 systolic  murmur. Abdomen: Bowel sounds normal; abdomen soft and nontender. No masses, organomegaly or hernias noted. No clubbing, cyanosis, edema, or deformity noted.  Nail health  good. Vascular: Carotid, radial artery, dorsalis pedis and dorsalis posterior tibial pulses are full and equal. No bruits present. Neurologic: Alert and oriented x3. Light touch over feet Skin: Intact without suspicious lesions or rashes. Psych: Mood and affect are normal. Normally interactive. Focused & motivated                                                                                        Assessment & Plan:  #1DM , significant improvement #2 myalgias #3 dyslipidemia, Statin taken "1/2 time" Plan: Ophth referral No change in meds

## 2011-01-07 NOTE — Patient Instructions (Signed)
Please  schedule fasting Labs :CK, BMET,Lipids, hepatic panel, A1c, urine microalbumin in 4 months.  Diabetes Monitor    The A1c test is used primarily to monitor the glucose control of diabetics over time. The goal of those with diabetes is to keep their blood glucose levels as close to normal as possible. This helps to minimize the complications caused by chronically elevated glucose levels, such as progressive damage to body organs like the kidneys, eyes, cardiovascular system, and nerves. The A1c test gives a picture of the average amount of glucose in the blood over the last few months ( 6-12 weeks). It can help a patient and his doctor know if the measures they are taking to control the patient's diabetes are successful or need to be adjusted.    Depending on the type of diabetes that you have, how well your diabetes is controlled, your A1c may be measured 2 to 4 times each year. When someone is first diagnosed with diabetes or if control is not good, A1c may be ordered more frequently.   NORMAL VALUES  Non diabetic adults: 5 %-6.1%  Good diabetic control: 6.2-6.4 %  Fair diabetic control: 6.5-7%  Poor diabetic control: greater than 7 % ( except with additional factors such as  advanced age; significant coronary or neurologic disease,etc). Check the A1c every 6 months if it is < 6.5%; every 4 months if  6.5% or higher. Goals for home glucose monitoring are : fasting  or morning glucose goal of  90-150. Two hours after any meal , goal = < 180, preferably < 150.       [name]

## 2011-01-13 ENCOUNTER — Other Ambulatory Visit: Payer: Self-pay | Admitting: Internal Medicine

## 2011-01-14 ENCOUNTER — Other Ambulatory Visit: Payer: Self-pay | Admitting: Internal Medicine

## 2011-01-14 ENCOUNTER — Telehealth: Payer: Self-pay | Admitting: Internal Medicine

## 2011-01-14 DIAGNOSIS — M25519 Pain in unspecified shoulder: Secondary | ICD-10-CM

## 2011-01-15 NOTE — Telephone Encounter (Signed)
See under orders 6/13

## 2011-01-19 ENCOUNTER — Telehealth: Payer: Self-pay | Admitting: Cardiology

## 2011-01-19 DIAGNOSIS — E7800 Pure hypercholesterolemia, unspecified: Secondary | ICD-10-CM

## 2011-01-19 DIAGNOSIS — I2510 Atherosclerotic heart disease of native coronary artery without angina pectoris: Secondary | ICD-10-CM

## 2011-01-19 NOTE — Telephone Encounter (Signed)
Pt having mri for his knee, is there a health concern due to his stents

## 2011-01-20 NOTE — Telephone Encounter (Signed)
Spoke w/pt he is aware ok for MRI he is overdue to see Dr Myrtis Ser, appt sch for 8/14, he ask about lipids, sch for labs on 8/10

## 2011-01-31 ENCOUNTER — Other Ambulatory Visit: Payer: Self-pay | Admitting: Cardiology

## 2011-02-06 ENCOUNTER — Other Ambulatory Visit: Payer: Self-pay | Admitting: Cardiology

## 2011-02-16 ENCOUNTER — Encounter: Payer: Self-pay | Admitting: Cardiology

## 2011-03-13 ENCOUNTER — Other Ambulatory Visit: Payer: Self-pay | Admitting: Cardiology

## 2011-03-13 ENCOUNTER — Other Ambulatory Visit (INDEPENDENT_AMBULATORY_CARE_PROVIDER_SITE_OTHER): Payer: 59 | Admitting: *Deleted

## 2011-03-13 DIAGNOSIS — I2510 Atherosclerotic heart disease of native coronary artery without angina pectoris: Secondary | ICD-10-CM

## 2011-03-13 DIAGNOSIS — E7800 Pure hypercholesterolemia, unspecified: Secondary | ICD-10-CM

## 2011-03-13 LAB — LIPID PANEL
Cholesterol: 188 mg/dL (ref 0–200)
HDL: 35.4 mg/dL — ABNORMAL LOW (ref >39.00)
Total CHOL/HDL Ratio: 5
VLDL: 45.6 mg/dL — ABNORMAL HIGH (ref 0.0–40.0)

## 2011-03-13 LAB — HEPATIC FUNCTION PANEL
Albumin: 4.5 g/dL (ref 3.5–5.2)
Total Bilirubin: 1 mg/dL (ref 0.3–1.2)

## 2011-03-16 ENCOUNTER — Encounter: Payer: Self-pay | Admitting: Cardiology

## 2011-03-16 ENCOUNTER — Other Ambulatory Visit: Payer: Self-pay | Admitting: Internal Medicine

## 2011-03-16 DIAGNOSIS — E785 Hyperlipidemia, unspecified: Secondary | ICD-10-CM | POA: Insufficient documentation

## 2011-03-16 DIAGNOSIS — I2510 Atherosclerotic heart disease of native coronary artery without angina pectoris: Secondary | ICD-10-CM | POA: Insufficient documentation

## 2011-03-16 DIAGNOSIS — I358 Other nonrheumatic aortic valve disorders: Secondary | ICD-10-CM | POA: Insufficient documentation

## 2011-03-16 DIAGNOSIS — M791 Myalgia, unspecified site: Secondary | ICD-10-CM | POA: Insufficient documentation

## 2011-03-16 DIAGNOSIS — R9430 Abnormal result of cardiovascular function study, unspecified: Secondary | ICD-10-CM | POA: Insufficient documentation

## 2011-03-16 DIAGNOSIS — IMO0002 Reserved for concepts with insufficient information to code with codable children: Secondary | ICD-10-CM | POA: Insufficient documentation

## 2011-03-16 DIAGNOSIS — K219 Gastro-esophageal reflux disease without esophagitis: Secondary | ICD-10-CM | POA: Insufficient documentation

## 2011-03-16 MED ORDER — GLIMEPIRIDE 1 MG PO TABS: ORAL_TABLET | ORAL | Status: DC

## 2011-03-16 NOTE — Telephone Encounter (Signed)
RX sent to pharmacy  

## 2011-03-17 ENCOUNTER — Ambulatory Visit (INDEPENDENT_AMBULATORY_CARE_PROVIDER_SITE_OTHER): Payer: 59 | Admitting: Cardiology

## 2011-03-17 ENCOUNTER — Encounter: Payer: Self-pay | Admitting: Cardiology

## 2011-03-17 DIAGNOSIS — I2510 Atherosclerotic heart disease of native coronary artery without angina pectoris: Secondary | ICD-10-CM

## 2011-03-17 DIAGNOSIS — E785 Hyperlipidemia, unspecified: Secondary | ICD-10-CM

## 2011-03-17 DIAGNOSIS — R0989 Other specified symptoms and signs involving the circulatory and respiratory systems: Secondary | ICD-10-CM

## 2011-03-17 DIAGNOSIS — I10 Essential (primary) hypertension: Secondary | ICD-10-CM

## 2011-03-17 MED ORDER — SIMVASTATIN 40 MG PO TABS: 40.0000 mg | ORAL_TABLET | Freq: Every evening | ORAL | Status: DC

## 2011-03-17 NOTE — Assessment & Plan Note (Signed)
Coronary disease is stable.  The patient underwent cardiac catheterization in 2004.  It is time to proceed now with a followup stress study.  We know his LV function was good by echo in June, 2009.  I will arrange the study and be in touch with him.

## 2011-03-17 NOTE — Patient Instructions (Signed)
Your physician recommends that you schedule a follow-up appointment in: 1 year  Your physician has recommended you make the following change in your medication: INCREASE Simvastatin to 40 mg daily  Your physician recommends that you return for lab work in: 8 weeks  Your physician has requested that you have en exercise stress myoview. For further information please visit [email or web address] Please follow instruction sheet, as given.  Your physician has requested that you have a carotid duplex. This test is an ultrasound of the carotid arteries in your neck. It looks at blood flow through these arteries that supply the brain with blood. Allow one hour for this exam. There are no restrictions or special instructions. THIS SHOULD BE SCHEDULED IN [place]

## 2011-03-17 NOTE — Assessment & Plan Note (Signed)
Patient does have mild carotid disease for the past.  It has now been 2 years.  We will plan a followup Doppler in November of 2012.

## 2011-03-17 NOTE — Assessment & Plan Note (Signed)
Blood pressure is controlled. No change in therapy. 

## 2011-03-17 NOTE — Progress Notes (Signed)
HPI Patient is seen for followup of coronary artery disease.  He is actually doing well.  He is not having any chest pain.  He has lost approximately 24 pounds.  This has been done by eating smaller portions and by increasing his walking.  He just had a minor procedure done to his knee.  Hopefully this will improve rapidly and he will get back to walking.  The patient does have dyslipidemia.  He had problems with Crestor in the past.  I cannot document any problems with Lipitor.  He is on simvastatin 20 mg.  As part of today's evaluation I have reviewed the old records completely and updated current electrolytes medical record. Allergies  Allergen Reactions  . Rosuvastatin     REACTION: myalgias    Current Outpatient Prescriptions  Medication Sig Dispense Refill  . aspirin 81 MG tablet Take 81 mg by mouth daily.        . fish oil-omega-3 fatty acids 1000 MG capsule Take 2 g by mouth daily.        [name] glimepiride (AMARYL) 1 MG tablet TAKE 1 TABLET BY MOUTH DAILY.LABS ARE DUE PER MD  30 tablet  1  . PLAVIX 75 MG tablet TAKE 1 TABLET BY MOUTH EVERY DAY  90 tablet  0  . ramipril (ALTACE) 10 MG capsule TAKE ONE CAPSULE BY MOUTH EVERY DAY  30 capsule  11  . simvastatin (ZOCOR) 20 MG tablet TAKE 1 TABLET AT BEDTIME  30 tablet  2  . sitaGLIPtan-metformin (JANUMET) 50-1000 MG per tablet Take 1 tablet by mouth 2 (two) times daily with a meal.        . ramipril (ALTACE) 10 MG tablet Take 10 mg by mouth daily.          History   Social History  . Marital Status: Single    Spouse Name: N/A    Number of Children: N/A  . Years of Education: N/A   Occupational History  . Emergency planning/management officer     Social History Main Topics  . Smoking status: Never Smoker   . Smokeless tobacco: Not on file  . Alcohol Use: No  . Drug Use: No  . Sexually Active: Not on file   Other Topics Concern  . Not on file   Social History Narrative  . No narrative on file    Family History  Problem Relation Age of Onset    . Hypertension Father   . Heart attack Father     bypass  . Heart attack Paternal Grandfather   . Cancer Maternal Grandfather     site unknown  . Diabetes Paternal Aunt   . Heart failure Maternal Grandmother     Past Medical History  Diagnosis Date  . Food poisoning     age 47  . Rash   . Hypertension   . Hyperlipidemia   . Diabetes mellitus   . 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 and coursed between the pulmonary artery and aorta anterior to the aorta. EF 55% cath-01/24/03 60% echo June 2009  . Aortic valve sclerosis     echo june 2009  . PVC (premature ventricular contraction) 06/2009    November, 2010  . Carotid bruit 06/2009    Doppler, November, 2010, 0-39% bilateral  . GERD (gastroesophageal reflux disease)   . Muscle ache     on crestor  09/04/09 CPK  409(8-119) tolerated simvastin in the past  . Ejection fraction     Ejection fraction 60%, echo, June, 2009    Past Surgical History  Procedure Date  . Coccyx bone removed   . Pilonidal cystectomy   . Back surgery   . Carotid stent 2004    ROS  Patient denies fever, chills, headache, sweats, rash, change in vision, change in hearing, chest pain, cough, nausea vomiting, urinary symptoms.  He has some shoulder discomfort.  He had some problems with the mild abnormality of his left knee which has been improved.  All of the systems are reviewed and are negative  PHYSICAL EXAM Patient is overweight but definitely looks better than the past.  Head is atraumatic.  There is no xanthelasma.  Jugular venous distention.  Lungs are clear.  Respiratory effort is unlabored.  Cardiac exam reveals S1 and S2.  There are no clicks or significant murmurs.  The abdomen is soft.  There is no peripheral edema.  There are no musculoskeletal deformities.  There are no skin rashes. Filed Vitals:    03/17/11 1149  BP: 137/81  Pulse: 79  Height: 5\' 8"  (1.727 m)  Weight: 231 lb (104.781 kg)    EKG EKG is done today and reviewed by me.  EKG is normal.  ASSESSMENT & PLAN

## 2011-03-19 ENCOUNTER — Encounter: Payer: Self-pay | Admitting: *Deleted

## 2011-03-23 ENCOUNTER — Other Ambulatory Visit: Payer: Self-pay | Admitting: Internal Medicine

## 2011-04-01 ENCOUNTER — Ambulatory Visit (INDEPENDENT_AMBULATORY_CARE_PROVIDER_SITE_OTHER): Payer: 59 | Admitting: Cardiology

## 2011-04-01 ENCOUNTER — Encounter: Payer: Self-pay | Admitting: *Deleted

## 2011-04-01 ENCOUNTER — Ambulatory Visit (HOSPITAL_COMMUNITY): Payer: 59 | Attending: Cardiology | Admitting: Radiology

## 2011-04-01 VITALS — Ht 69.0 in | Wt 227.0 lb

## 2011-04-01 VITALS — BP 148/80 | HR 92 | Ht 69.0 in | Wt 227.4 lb

## 2011-04-01 DIAGNOSIS — E119 Type 2 diabetes mellitus without complications: Secondary | ICD-10-CM

## 2011-04-01 DIAGNOSIS — I739 Peripheral vascular disease, unspecified: Secondary | ICD-10-CM

## 2011-04-01 DIAGNOSIS — I2510 Atherosclerotic heart disease of native coronary artery without angina pectoris: Secondary | ICD-10-CM | POA: Insufficient documentation

## 2011-04-01 DIAGNOSIS — E785 Hyperlipidemia, unspecified: Secondary | ICD-10-CM

## 2011-04-01 DIAGNOSIS — Z01810 Encounter for preprocedural cardiovascular examination: Secondary | ICD-10-CM

## 2011-04-01 LAB — CBC WITH DIFFERENTIAL/PLATELET
Basophils Absolute: 0 10*3/uL (ref 0.0–0.1)
Basophils Relative: 0.3 % (ref 0.0–3.0)
Eosinophils Absolute: 0 10*3/uL (ref 0.0–0.7)
Eosinophils Relative: 0.5 % (ref 0.0–5.0)
HCT: 40.6 % (ref 39.0–52.0)
Hemoglobin: 13.9 g/dL (ref 13.0–17.0)
Lymphocytes Relative: 10.4 % — ABNORMAL LOW (ref 12.0–46.0)
Lymphs Abs: 0.7 10*3/uL (ref 0.7–4.0)
MCHC: 34.2 g/dL (ref 30.0–36.0)
MCV: 92.3 fl (ref 78.0–100.0)
Monocytes Absolute: 0.4 10*3/uL (ref 0.1–1.0)
Monocytes Relative: 5.9 % (ref 3.0–12.0)
Neutro Abs: 5.9 10*3/uL (ref 1.4–7.7)
Neutrophils Relative %: 82.9 % — ABNORMAL HIGH (ref 43.0–77.0)
Platelets: 162 10*3/uL (ref 150.0–400.0)
RBC: 4.4 Mil/uL (ref 4.22–5.81)
RDW: 13.7 % (ref 11.5–14.6)
WBC: 7.1 10*3/uL (ref 4.5–10.5)

## 2011-04-01 LAB — BASIC METABOLIC PANEL
BUN: 13 mg/dL (ref 6–23)
Chloride: 102 mEq/L (ref 96–112)
Glucose, Bld: 264 mg/dL — ABNORMAL HIGH (ref 70–99)
Potassium: 3.9 mEq/L (ref 3.5–5.1)

## 2011-04-01 LAB — APTT: aPTT: 27.7 s (ref 21.7–28.8)

## 2011-04-01 MED ORDER — TECHNETIUM TC 99M TETROFOSMIN IV KIT
33.0000 | PACK | Freq: Once | INTRAVENOUS | Status: AC | PRN
  Administered 2011-04-01: 33 via INTRAVENOUS

## 2011-04-01 MED ORDER — TECHNETIUM TC 99M TETROFOSMIN IV KIT
11.0000 | PACK | Freq: Once | INTRAVENOUS | Status: AC | PRN
  Administered 2011-04-01: 11 via INTRAVENOUS

## 2011-04-01 NOTE — Progress Notes (Signed)
[facility] SITE 3 NUCLEAR MED [address] [place] [address] [phone]  Cardiology Nuclear Med Study  [name] [name] is a 60 y.o. male [id number] [birth year]   Nuclear Med Background Indication for Stress Test:  Evaluation for Ischemia and Stent Patency History:  '04 Stent-RCA; '07 [facility]:No definite ischemia, EF=52%; '09 Echo:EF=55-60%, mild AR Cardiac Risk Factors: Carotid Disease, Family History - CAD, Hypertension, Lipids, NIDDM and Obesity  Symptoms:  Palpitations   Nuclear Pre-Procedure Caffeine/Decaff Intake:  None NPO After: 9:00pm   Lungs:  Clear. IV 0.9% NS with Angio Cath:  20g  IV Site: R Antecubital x 1, tolerated well IV Started by:  Irean Hong, RN  Chest Size (in):  44 Cup Size: n/a  Height: 5\' 9"  (1.753 m)  Weight:  227 lb (102.967 kg)  BMI:  Body mass index is 33.52 kg/(m^2). Tech Comments:  Held diabetic po medication today    Nuclear Med Study 1 or 2 day study: 1 day  Stress Test Type:  Stress  Reading MD: Charlton Haws, MD  Order Authorizing Raidon Swanner:  Willa Rough, MD  Resting Radionuclide: Technetium 3m Tetrofosmin  Resting Radionuclide Dose: 11.0 mCi   Stress Radionuclide:  Technetium 6m Tetrofosmin  Stress Radionuclide Dose: 33.0 mCi           Stress Protocol Rest HR: 68 Stress HR: 153  Rest BP: 176/87 Stress BP: 225/97  Exercise Time (min): 7:45 METS: 9.9   Predicted Max HR: 162 bpm % Max HR: 94.44 bpm Rate Pressure Product: 29528   Dose of Adenosine (mg):  n/a Dose of Lexiscan: n/a mg  Dose of Atropine (mg): n/a Dose of Dobutamine: n/a mcg/kg/min (at max HR)  Stress Test Technologist: Smiley Houseman, CMA-N  Nuclear Technologist:  Doyne Keel, CNMT     Rest Procedure:  Myocardial perfusion imaging was performed at rest 45 minutes following the intravenous administration of Technetium 34m Tetrofosmin.  Rest ECG: Minor nonspecific ST-T wave change in lead III.  Stress Procedure:  The patient exercised for  7:45 on the treadmill utilizing the Bruce protocol.  The patient stopped due to fatigue and marked ST-T wave changes, but he denied any chest pain.  There were occasional PVC's and PAC's.  He had a hypertensive response to exercise, >213/75 and then dropped immediately post exercise to 116/62 and then returned to a hypertensive response of >225/97.  Technetium 68m Tetrofosmin was injected at peak exercise and myocardial perfusion imaging was performed after a brief delay.  Stress ECG: Marked 2 mm of ST segment depression in inferolateral leads  QPS Raw Data Images:  Normal; no motion artifact; normal heart/lung ratio. Stress Images:  There is decreased uptake in the inferior wall. Rest Images:  Normal homogeneous uptake in all areas of the myocardium. Subtraction (SDS):  These findings are consistent with ischemia. Transient Ischemic Dilatation (Normal <1.22):  1.04 Lung/Heart Ratio (Normal <0.45):  0.33  Quantitative Gated Spect Images QGS EDV:  145 ml QGS ESV:  67 ml QGS cine images:  NL LV Function; NL Wall Motion QGS EF: 54%  Impression Exercise Capacity:  Fair exercise capacity. BP Response:  Hypertensive blood pressure response. Clinical Symptoms:  There is dyspnea. ECG Impression:  Significant ST abnormalities consistent with ischemia. Comparison with Prior Nuclear Study: No images to compare  Overall Impression:  Nuclear images suggest mild inferior wall ischemia but ECG response is strongly positive       Charlton Haws

## 2011-04-01 NOTE — Patient Instructions (Signed)
Your physician recommends that you schedule a follow-up appointment in: AFTER CATH WITH DR Myrtis Ser Your physician recommends that you continue on your current medications as directed. Please refer to the Current Medication list given to you todaY Your physician recommends that you return for lab work in: TODAY BMET CBC PT PTT DX V72.81  Your physician has requested that you have a cardiac catheterization. Cardiac catheterization is used to diagnose and/or treat various heart conditions. Doctors may recommend this procedure for a number of different reasons. The most common reason is to evaluate chest pain. Chest pain can be a symptom of coronary artery disease (CAD), and cardiac catheterization can show whether plaque is narrowing or blocking your heart's arteries. This procedure is also used to evaluate the valves, as well as measure the blood flow and oxygen levels in different parts of your heart. For further information please visit [email or web address] Please follow instruction sheet, as given. 04-07-11 WITH DR Riley Kill

## 2011-04-02 NOTE — Progress Notes (Signed)
HPI:  Mr. Rodeheaver is seen as an add on from the nuclear lab.  The study was done by Dr. Myrtis Ser, although the patient was doing well.  However, he had marked exercise induced ST segment depression, and even at three minutes there was downsloping inferolateral ST depression.  This is new from th prior tracing, and the patient and I compared the comparable exercise tracings from 2007.  There were no ST segment changes.  The current nuclear scan also suggested inferior ischemia.  He clearly states he is not having any symptoms per se.  He does not, in reviewing his medications, that he has not been able to take metoprolol because of mood changes.  He said it was miserable.  Based on the above findings, cath has been recommended.    Current Outpatient Prescriptions  Medication Sig Dispense Refill  . aspirin 81 MG tablet Take 81 mg by mouth daily.        . fish oil-omega-3 fatty acids 1000 MG capsule Take 2 g by mouth daily.        [name] glimepiride (AMARYL) 1 MG tablet TAKE 1 TABLET BY MOUTH DAILY.LABS ARE DUE PER MD  30 tablet  1  . JANUMET 50-1000 MG per tablet TAKE 1 TABLET BY MOUTH TWICE A DAY WITH MEALS  60 tablet  5  . PLAVIX 75 MG tablet TAKE 1 TABLET BY MOUTH EVERY DAY  90 tablet  0  . ramipril (ALTACE) 10 MG capsule TAKE ONE CAPSULE BY MOUTH EVERY DAY  30 capsule  11  . simvastatin (ZOCOR) 40 MG tablet Take 1 tablet (40 mg total) by mouth every evening.  30 tablet  11    Allergies  Allergen Reactions  . Rosuvastatin     REACTION: myalgias    Past Medical History  Diagnosis Date  . Food poisoning     age 60  . Rash   . Hypertension   . Hyperlipidemia   . Diabetes mellitus   . 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 and coursed between the pulmonary artery and aorta anterior to the aorta. EF 55% cath-01/24/03 60% echo June 2009  . Aortic valve sclerosis    echo june 2009  . PVC (premature ventricular contraction) 06/2009    November, 2010  . Carotid bruit 06/2009    Doppler, November, 2010, 0-39% bilateral  . GERD (gastroesophageal reflux disease)   . Muscle ache     on crestor  09/04/09 CPK 193(7-232) tolerated simvastin in the past  . Ejection fraction     Ejection fraction 60%, echo, June, 2009    Past Surgical History  Procedure Date  . Coccyx bone removed   . Pilonidal cystectomy   . Back surgery   . Carotid stent 2004    Family History  Problem Relation Age of Onset  . Hypertension Father   . Heart attack Father     bypass  . Heart attack Paternal Grandfather   . Cancer Maternal Grandfather     site unknown  . Diabetes Paternal Aunt   . Heart failure Maternal Grandmother     History   Social History  . Marital Status: Single    Spouse Name: N/A    Number of Children: N/A  . Years of Education: N/A   Occupational History  . Emergency planning/management officer     Social History Main Topics  . Smoking status:  Never Smoker   . Smokeless tobacco: Not on file  . Alcohol Use: No  . Drug Use: No  . Sexually Active: Not on file   Other Topics Concern  . Not on file   Social History Narrative  . No narrative on file    ROS: Please see the HPI.  All other systems reviewed and negative.  PHYSICAL EXAM:  BP 148/80  Pulse 92  Ht 5\' 9"  (1.753 m)  Wt 227 lb 6.4 oz (103.148 kg)  BMI 33.58 kg/m2  General: Well developed, well nourished, in no acute distress. Head:  Normocephalic and atraumatic. Neck: no JVD Lungs: Clear to auscultation and percussion. Heart: Normal S1 and S2.  No murmur, rubs or gallops.  Abdomen:  Normal bowel sounds; soft; non tender; no organomegaly Pulses: Pulses normal in all 4 extremities. Extremities: No clubbing or cyanosis. No edema. Neurologic: Alert and oriented x 3.  EKG:  Nuclear 03/17/2011: Rest Procedure: Myocardial perfusion imaging was performed at rest 45 minutes following the intravenous  administration of Technetium 53m Tetrofosmin.  Rest ECG: Minor nonspecific ST-T wave change in lead III.  Stress Procedure: The patient exercised for 7:45 on the treadmill utilizing the Bruce protocol. The patient stopped due to fatigue and marked ST-T wave changes, but he denied any chest pain. There were occasional PVC's and PAC's. He had a hypertensive response to exercise, >213/75 and then dropped immediately post exercise to 116/62 and then returned to a hypertensive response of >225/97. Technetium 73m Tetrofosmin was injected at peak exercise and myocardial perfusion imaging was performed after a brief delay.  Stress ECG: Marked 2 mm of ST segment depression in inferolateral leads  QPS  Raw Data Images: Normal; no motion artifact; normal heart/lung ratio.  Stress Images: There is decreased uptake in the inferior wall.  Rest Images: Normal homogeneous uptake in all areas of the myocardium.  Subtraction (SDS): These findings are consistent with ischemia.  Transient Ischemic Dilatation (Normal <1.22): 1.04  Lung/Heart Ratio (Normal <0.45): 0.33  Quantitative Gated Spect Images  QGS EDV: 145 ml  QGS ESV: 67 ml  QGS cine images: NL LV Function; NL Wall Motion  QGS EF: 54%  Impression  Exercise Capacity: Fair exercise capacity.  BP Response: Hypertensive blood pressure response.  Clinical Symptoms: There is dyspnea.  ECG Impression: Significant ST abnormalities consistent with ischemia.  Comparison with Prior Nuclear Study: No images to compare  Overall Impression: Nuclear images suggest mild inferior wall ischemia but ECG response is strongly positive  Cath:  01/23/2003  SELECTIVE CORONARY ANGIOGRAPHY  1. The left main coronary artery was a large caliber vessel with no evidence  of flow limiting disease.  2. Left anterior descending artery was a large caliber vessel coursing  across the apex. There was no flow limiting disease. Diagonal branches  were also free of flow limiting  disease.  3. Circumflex coronary artery was, again, large caliber vessel without  evidence for flow-limiting lesions.  4. The right coronary artery again was an aberrantly arising vessel. Ostium  was at the level of the left coronary cusp. In the RAO projection it was  clear that the right coronary artery was traversing anterior to the  aorta. The right coronary artery and distal segment had a 99% stenosis.  The PDA and posterolateral branch were free of flow-limiting disease.  RECOMMENDATIONS: The results were reviewed with Charlies Constable, M.D. The  plan is to proceed with percutaneous coronary intervention of the right  coronary artery which is likely  the culprit lesion. However, note that also  the patient's aberrant coronary anatomy with the RCA traversing likely  between the pulmonary artery and aorta anterior to the aorta. A repeat  exercise Cardiolite study should be obtained four to six weeks after  coronary intervention to make sure that the patient is not ischemic based on  the anomalous location of the right coronary artery.   PCI report:  01/23/2003 PROCEDURE: The procedure was performed via the right femoral artery. We  tried multiple guiding catheters, but were finally able to engage the right  coronary artery with its anomalous origin with an AL2 6-French guiding  catheter with side holes. With the help of the wire we were able to seat  the catheter better. We advanced first a Luge wire and then a PT2 extra  support wire down the right coronary artery across the lesion. We pre  dilated with a 3.25 x 20 mm Maverick performing several inflations to 12  atmospheres for 30 seconds. We then deployed a 3.0 x 24 mm TAXUS stent. We  had difficulty navigating the stent out of the guiding catheter and down the  vessel, but were finally able to accomplish this. We deployed this with one  inflation of 15 atmospheres for 30 seconds. The procedure was a long  procedure because of  difficulty with guiding catheter seating but the  patient tolerated the procedure well and left the laboratory in satisfactory  condition.  RESULTS: Initially, the stenosis in the distal right coronary artery was  estimated at 95%. Following stenting this improved to less than 10%.  CONCLUSIONS: Successful percutaneous coronary intervention (PCI) of the  lesion in the distal right coronary artery with a TAXUS stent with  improvement in the __________ narrowing from 95% to less than 10%.  DISPOSITION: The patient returned to the PACU for further observation.             ASSESSMENT AND PLAN:

## 2011-04-07 ENCOUNTER — Inpatient Hospital Stay (HOSPITAL_BASED_OUTPATIENT_CLINIC_OR_DEPARTMENT_OTHER)
Admission: RE | Admit: 2011-04-07 | Discharge: 2011-04-07 | Disposition: A | Discharge status: Home / Self Care | Payer: 59 | Source: Ambulatory Visit | Attending: Cardiology | Admitting: Cardiology

## 2011-04-07 DIAGNOSIS — I2510 Atherosclerotic heart disease of native coronary artery without angina pectoris: Secondary | ICD-10-CM | POA: Insufficient documentation

## 2011-04-07 DIAGNOSIS — R9439 Abnormal result of other cardiovascular function study: Secondary | ICD-10-CM | POA: Insufficient documentation

## 2011-04-07 DIAGNOSIS — E119 Type 2 diabetes mellitus without complications: Secondary | ICD-10-CM | POA: Insufficient documentation

## 2011-04-07 DIAGNOSIS — Z9861 Coronary angioplasty status: Secondary | ICD-10-CM | POA: Insufficient documentation

## 2011-04-07 NOTE — Assessment & Plan Note (Signed)
Lipids at best under modest control.  Will review with Dr. Myrtis Ser.

## 2011-04-07 NOTE — Assessment & Plan Note (Signed)
Importantly, patient has been asymptomatic.  However, he has a high risk stress test recommended by Dr. Myrtis Ser.  He had early onset of marked ST depression which was new from the last exercise study.  He had BP drop.  Nuclear scan suggest an inferior defect.  We have talked about all of the options and reviewed them in detail.  Would favor diagnostic cath since he is diabetic.  He could be treated medically.  From a technical standpoint, the RCA had a first generation DES with long Taxus that was difficult to deliver.  An AL2 guide was required in part due to the anterior takeoff of the RCA.  That would impact the approach to this.  An AL1 was used for the diagnostic study.  Given his asymptomatic status, the best option would be to do a diagnostic study, then consider the options.

## 2011-04-08 ENCOUNTER — Encounter: Payer: Self-pay | Admitting: *Deleted

## 2011-04-14 ENCOUNTER — Other Ambulatory Visit: Payer: Self-pay | Admitting: Cardiology

## 2011-04-15 NOTE — Cardiovascular Report (Signed)
NAMEDEVERICK, PRUSS NO.:  [id number]  MEDICAL RECORD NO.:  [id number]  LOCATION:  [id number]                     FACILITY:  [facility]  PHYSICIAN:  Arturo Morton. Riley Kill, MD, FACCDATE OF BIRTH:  04-18-1951  DATE OF PROCEDURE:  04/07/2011 DATE OF DISCHARGE:  04/01/2011                           CARDIAC CATHETERIZATION   INDICATIONS:  Mr. Coggeshall is a very delightful 60 year old gentleman who has previously undergone stenting of his distal right coronary artery. He now presents with no symptoms.  He did however have a stress test ordered by Dr. Myrtis Ser, the patient is a diabetic.  The study demonstrated a very high-risk stress test with marked ST elevation which was new from a previous study.  There was a small inferior defect.  The current study was done to assess coronary anatomy.  PROCEDURES: 1. Left heart catheterization. 2. Selective coronary arteriography. 3. Selective left ventriculography. 4. Femoral closure.  DESCRIPTION OF PROCEDURE:  The procedure was performed from the right femoral artery.  There was a mild degree of iliac tortuosity as previously noted.  We were able to cannulate the left coronary artery fairly easily.  However, the right proved to be a challenge.  We started out the catheters that were previously used.  An AL-1 and AL-2 were utilized.  Our best image was actually with the left bypass although it would not reach the origin of the right coronary takeoff which is in left coronary cusp.  Following this, we upgraded to a 6-French sheath. We used a longer 23-cm sheath, and gave him 1000 units of heparin.  We then used an AL-1, AL-2, as well as a left bypass guide.  The left bypass guide also again demonstrated the best visualization although we were able to nearly get the diagnostic AL-2 to the origin.  Following this, ventriculography was performed in the RAO projection.  There were no major complications.  We reprepped the groin.  I exchanged  the 23-cm sheath for a shorter femoral sheath and an angiogram was performed in the femoral artery.  When then used an Angio-Seal closure device to close the femoral artery.  There was good hemostasis noted.  He was taken to the holding area in satisfactory clinical condition and Dr. Myrtis Ser and I carefully reviewed the films.  HEMODYNAMIC DATA:  The central aortic pressure was 158/84, LV pressure was 154/12.  There was no gradient on pullback across the aortic valve.  ANGIOGRAPHIC DATA: 1. The left main is free of critical disease. 2. The LAD courses to the apex.  There is a major diagonal that comes     off and bifurcates distally, has a typical diabetic appearance and     has about an 80% stenosis.  The LAD proper is small in caliber     compatible with diffuse disease changed from the previous study.     There is 70-80 and 60% lesions noted focally, but is noted the     vessel was smaller in caliber and somewhat diffusely diseased.  The     apical portion of the vessel, however, was somewhat larger in     caliber similar to its original caliber. 3. The circumflex provides a 30% proximal area of disease,  then a new     80% lesion distally, it bifurcates. 4. The right coronary artery is somewhat under opacified, but there is     probably an eccentric stenosis of 60% prior to the stent.  The     stent is widely patent and there is a 70-80% lesion in the proximal     PDA.  Both PDA and PLA appear to be graftable.  CONCLUSIONS: 1. Preserved left ventricular function. 2. Compared to the previous study, the right coronary stent continued     to remain patent. 3. Since the last study, there is progression of disease involving the     posterior descending branch, a large obtuse marginal, the diagonal     and the LAD.  DISPOSITION:  The patient is virtually asymptomatic.  However, he has a very high-risk stress test and is diabetic.  Dr. Myrtis Ser and I have reviewed the films.  We will likely  recommend revascularization surgery or referral to a surgeon for discussion.     Arturo Morton. Riley Kill, MD, [facility]     TDS/MEDQ  D:  04/07/2011  T:  04/07/2011  Job:  [id number]  cc:   Luis Abed, MD, [facility]  Electronically Signed by Shawnie Pons MD [facility] on 04/15/2011 06:09:20 PM

## 2011-04-20 ENCOUNTER — Encounter: Payer: Self-pay | Admitting: Thoracic Surgery (Cardiothoracic Vascular Surgery)

## 2011-04-20 ENCOUNTER — Ambulatory Visit: Payer: 59 | Admitting: Cardiology

## 2011-04-20 ENCOUNTER — Encounter: Payer: 59 | Admitting: Thoracic Surgery (Cardiothoracic Vascular Surgery)

## 2011-04-20 ENCOUNTER — Institutional Professional Consult (permissible substitution) (INDEPENDENT_AMBULATORY_CARE_PROVIDER_SITE_OTHER): Payer: 59 | Admitting: Thoracic Surgery (Cardiothoracic Vascular Surgery)

## 2011-04-20 DIAGNOSIS — I2510 Atherosclerotic heart disease of native coronary artery without angina pectoris: Secondary | ICD-10-CM

## 2011-04-20 NOTE — Patient Instructions (Signed)
Stopped taking Plavix in anticipation of surgery. The patient will present to the hospital on Monday, September 24 for preoperative screening.

## 2011-04-20 NOTE — Progress Notes (Signed)
PCP is Marga Melnick, MD, MD Referring Provider is Pecola Lawless, MD  Chief Complaint  Patient presents with  . Coronary Artery Disease    consultation for poss CABG, Abnormal Cath eterization 9/4/212    HPI:  Patient with known history of coronary artery disease, hypertension, hyperlipidemia, and type 2 diabetes mellitus who had routine followup nuclear stress test performed on 04/01/2011. Stress test was notable for the development of ST segment depression in the inferior and lateral leads during stress. Nuclear images also confirmed findings consistent with inferior wall ischemia. This led to elective cardiac catheterization which was performed 04/07/2011. The patient was found to have severe three-vessel coronary artery disease with normal left ventricular function. He has been referred for elective surgical revascularization.  The patient denies any history of chest pain chest pressure chest tightness either with activity or at rest. Since his heart catheterization he has had some vague transient episodes of twinges of chest discomfort, but he thinks this may be related to anxiety. He denies exertional shortness of breath, resting shortness of breath, PND, orthopnea, or lower extremity edema. He has not had palpitations no syncope.  Past Medical History  Diagnosis Date  . Food poisoning     age 20  . Rash   . Hypertension   . Hyperlipidemia   . Diabetes mellitus   . 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 and coursed between the pulmonary artery and aorta anterior to the aorta. EF 55% cath-01/24/03 60% echo June 2009  . Aortic valve sclerosis     echo june 2009  . PVC (premature ventricular contraction) 06/2009    November, 2010  . Carotid bruit 06/2009    Doppler, November, 2010, 0-39% bilateral  . GERD (gastroesophageal reflux disease)   .  Muscle ache     on crestor  09/04/09 CPK 193(7-232) tolerated simvastin in the past  . Ejection fraction     Ejection fraction 60%, echo, June, 2009    Past Surgical History  Procedure Date  . Coccyx bone removed   . Pilonidal cystectomy   . Back surgery     Family History  Problem Relation Age of Onset  . Hypertension Father   . Heart attack Father     bypass  . Heart disease Father   . Coronary artery disease Father   . Heart attack Paternal Grandfather   . Cancer Maternal Grandfather     site unknown  . Diabetes Paternal Aunt   . Heart failure Maternal Grandmother     Social History History  Substance Use Topics  . Smoking status: Never Smoker   . Smokeless tobacco: Not on file  . Alcohol Use: No    Current Outpatient Prescriptions  Medication Sig Dispense Refill  . aspirin 81 MG tablet Take 81 mg by mouth daily.        . clopidogrel (PLAVIX) 75 MG tablet TAKE 1 TABLET BY MOUTH EVERY DAY  90 tablet  3  . fish oil-omega-3 fatty acids 1000 MG capsule Take 2 g by mouth daily.        [name] glimepiride (AMARYL) 1 MG tablet TAKE 1 TABLET BY MOUTH DAILY.LABS ARE DUE PER MD  30 tablet  1  . HYDROcodone-acetaminophen (NORCO) 5-325 MG per tablet 1 tablet every 6 (six) hours as needed.       [name] JANUMET 50-1000 MG per tablet TAKE 1 TABLET  BY MOUTH TWICE A DAY WITH MEALS  60 tablet  5  . ramipril (ALTACE) 10 MG capsule TAKE ONE CAPSULE BY MOUTH EVERY DAY  30 capsule  11  . simvastatin (ZOCOR) 20 MG tablet Take 20 mg by mouth at bedtime.          Allergies  Allergen Reactions  . Rosuvastatin     REACTION: myalgias    Review of Systems  Constitutional: Negative.        He has intentionally lost 32 pounds over the past year on a diet and exercise program.  HENT: Negative.   Eyes: Negative.   Respiratory: Negative.   Cardiovascular: Negative.   Gastrointestinal: Negative.        Brief episode of diarrhea last week which has resolved  Genitourinary: Negative.     Musculoskeletal: Positive for arthralgias.       Left knee, improved after drainage of Baker's cyst  Neurological: Negative.   Hematological: Negative.   Psychiatric/Behavioral: Negative.     BP 144/82  Pulse 96  Resp 18  Ht 5\' 8"  (1.727 m)  Wt 218 lb (98.884 kg)  BMI 33.15 kg/m2  SpO2 98% Physical Exam  Nursing note and vitals reviewed. Constitutional: He is oriented to person, place, and time. He appears well-developed and well-nourished.  HENT:  Head: Normocephalic.  Eyes: Conjunctivae and EOM are normal. Pupils are equal, round, and reactive to light.  Neck: Normal range of motion. Neck supple. No thyromegaly present.  Cardiovascular: Normal rate, regular rhythm, normal heart sounds and intact distal pulses.        Normal Allen's test on left side  Pulmonary/Chest: Effort normal and breath sounds normal. He has no wheezes. He has no rales.  Abdominal: Soft. Bowel sounds are normal. He exhibits no distension and no mass. There is no tenderness.  Musculoskeletal: Normal range of motion.  Lymphadenopathy:    He has no cervical adenopathy.  Neurological: He is oriented to person, place, and time.  Skin: Skin is warm and dry.  Psychiatric:       Tendency to get emotional about his recent diagnosis of CAD     Diagnostic Tests: Cardiac catheterization performed 04/07/2011 was reviewed. His demonstrates severe three-vessel coronary artery disease with normal left ventricular function. Specifically, there are 70 and 80% lesions in the mid and distal left anterior descending coronary artery. This vessel was somewhat small and appears diffusely diseased. There is a large diagonal branch has 80% proximal stenosis. The left circumflex coronary artery is large and gives rise to a single dominant bifurcating obtuse marginal branch. There is 80-90% discrete stenosis in this vessel. There is right dominant coronary circulation. The previously placed stent is widely patent. There is at least  60% stenosis proximal to the stent. There was 70-80% stenosis in the proximal posterior ascending coronary artery. Left ventricular function appears normal.  Impression: Severe three-vessel coronary artery disease with normal left ventricular function. The patient is essentially asymptomatic, but he had a recent stress test that was positive with high risk for inferior wall ischemia. The patient is diabetic and mildly overweight. He otherwise appears appears to be in fairly good health, and overall his risks of surgery will be low. I agree that coronary artery bypass grafting is probably the best course of treatment  Plan: I've discussed the indications, risks, and potential benefits of coronary artery bypass grafting at length with the patient and his son here in the office today. Alternative treatment strategies have been discussed. All  of their questions have been addressed. They understand and accept all potential associated risks of surgery including but not limited to risk of death, stroke, myocardial infarction, congestive heart failure, respiratory failure, pneumonia, bleeding requiring blood transfusion, arrhythmia, infection, and recurrent coronary artery disease. All of their questions been addressed. We tentatively plan to proceed with surgery on Wednesday, September 26. The patient has been instructed to discontinue taking Plavix in anticipation of surgery at that time.

## 2011-04-23 ENCOUNTER — Telehealth: Payer: Self-pay | Admitting: Internal Medicine

## 2011-04-23 NOTE — Telephone Encounter (Signed)
Dr.Hopper please advise if you would like for patient to switch, if not forward to [facility] (Triage for Prior Auth)

## 2011-04-24 ENCOUNTER — Encounter: Payer: Self-pay | Admitting: Cardiology

## 2011-04-24 ENCOUNTER — Telehealth: Payer: Self-pay | Admitting: Cardiology

## 2011-04-24 NOTE — Telephone Encounter (Signed)
DR Myrtis Ser CALLED PT./CY

## 2011-04-24 NOTE — Telephone Encounter (Signed)
Pt needs to know if he needs to keep Monday's appt

## 2011-04-27 ENCOUNTER — Ambulatory Visit (HOSPITAL_COMMUNITY)
Admission: RE | Admit: 2011-04-27 | Discharge: 2011-04-27 | Disposition: A | Discharge status: Home / Self Care | Payer: 59 | Source: Ambulatory Visit | Attending: Thoracic Surgery (Cardiothoracic Vascular Surgery) | Admitting: Thoracic Surgery (Cardiothoracic Vascular Surgery)

## 2011-04-27 ENCOUNTER — Encounter (HOSPITAL_COMMUNITY)
Admission: RE | Admit: 2011-04-27 | Discharge: 2011-04-27 | Disposition: A | Discharge status: Home / Self Care | Payer: 59 | Source: Ambulatory Visit | Attending: Thoracic Surgery (Cardiothoracic Vascular Surgery) | Admitting: Thoracic Surgery (Cardiothoracic Vascular Surgery)

## 2011-04-27 ENCOUNTER — Other Ambulatory Visit (HOSPITAL_COMMUNITY): Payer: 59

## 2011-04-27 ENCOUNTER — Encounter: Payer: 59 | Admitting: Cardiology

## 2011-04-27 ENCOUNTER — Other Ambulatory Visit: Payer: Self-pay | Admitting: Thoracic Surgery (Cardiothoracic Vascular Surgery)

## 2011-04-27 DIAGNOSIS — Z01812 Encounter for preprocedural laboratory examination: Secondary | ICD-10-CM | POA: Insufficient documentation

## 2011-04-27 DIAGNOSIS — I2510 Atherosclerotic heart disease of native coronary artery without angina pectoris: Secondary | ICD-10-CM

## 2011-04-27 DIAGNOSIS — Z01810 Encounter for preprocedural cardiovascular examination: Secondary | ICD-10-CM

## 2011-04-27 DIAGNOSIS — Z01818 Encounter for other preprocedural examination: Secondary | ICD-10-CM | POA: Insufficient documentation

## 2011-04-27 LAB — COMPREHENSIVE METABOLIC PANEL
Albumin: 4.1 g/dL (ref 3.5–5.2)
BUN: 10 mg/dL (ref 6–23)
Calcium: 9.1 mg/dL (ref 8.4–10.5)
GFR calc Af Amer: >60 mL/min (ref >60)
Glucose, Bld: 105 mg/dL — ABNORMAL HIGH (ref 70–99)
Sodium: 142 mEq/L (ref 135–145)
Total Protein: 6.7 g/dL (ref 6.0–8.3)

## 2011-04-27 LAB — BLOOD GAS, ARTERIAL
Drawn by: 344381
FIO2: 0.21 %
Patient temperature: 98.6
TCO2: 27 mmol/L (ref 0–100)
pCO2 arterial: 42.8 mmHg (ref 35.0–45.0)
pH, Arterial: 7.396 (ref 7.350–7.450)

## 2011-04-27 LAB — ABO/RH: ABO/RH(D): A POS

## 2011-04-27 LAB — PULMONARY FUNCTION TEST

## 2011-04-27 LAB — APTT: aPTT: 29 seconds (ref 24–37)

## 2011-04-27 LAB — CBC
HCT: 35.5 % — ABNORMAL LOW (ref 39.0–52.0)
MCHC: 35.5 g/dL (ref 30.0–36.0)
Platelets: 166 10*3/uL (ref 150–400)
RDW: 13.1 % (ref 11.5–15.5)
WBC: 6.9 10*3/uL (ref 4.0–10.5)

## 2011-04-27 LAB — URINALYSIS, ROUTINE W REFLEX MICROSCOPIC
Bilirubin Urine: NEGATIVE
Hgb urine dipstick: NEGATIVE
Nitrite: NEGATIVE
Protein, ur: NEGATIVE mg/dL
Urobilinogen, UA: 0.2 mg/dL (ref 0.0–1.0)

## 2011-04-27 LAB — PROTIME-INR: Prothrombin Time: 13.4 seconds (ref 11.6–15.2)

## 2011-04-28 ENCOUNTER — Encounter: Payer: Self-pay | Admitting: Thoracic Surgery (Cardiothoracic Vascular Surgery)

## 2011-04-28 ENCOUNTER — Other Ambulatory Visit: Payer: Self-pay | Admitting: Internal Medicine

## 2011-04-28 DIAGNOSIS — E119 Type 2 diabetes mellitus without complications: Secondary | ICD-10-CM

## 2011-04-28 MED ORDER — SAXAGLIPTIN-METFORMIN ER 5-1000 MG PO TB24: ORAL_TABLET | ORAL | Status: DC

## 2011-05-01 ENCOUNTER — Inpatient Hospital Stay (HOSPITAL_COMMUNITY)
Admission: RE | Admit: 2011-05-01 | Discharge: 2011-05-05 | DRG: 236 | Disposition: A | Discharge status: Home / Self Care | Payer: 59 | Source: Ambulatory Visit | Attending: Thoracic Surgery (Cardiothoracic Vascular Surgery) | Admitting: Thoracic Surgery (Cardiothoracic Vascular Surgery)

## 2011-05-01 ENCOUNTER — Inpatient Hospital Stay (HOSPITAL_COMMUNITY): Payer: 59

## 2011-05-01 DIAGNOSIS — E8779 Other fluid overload: Secondary | ICD-10-CM | POA: Diagnosis not present

## 2011-05-01 DIAGNOSIS — D62 Acute posthemorrhagic anemia: Secondary | ICD-10-CM | POA: Diagnosis not present

## 2011-05-01 DIAGNOSIS — Z01810 Encounter for preprocedural cardiovascular examination: Secondary | ICD-10-CM

## 2011-05-01 DIAGNOSIS — E785 Hyperlipidemia, unspecified: Secondary | ICD-10-CM | POA: Diagnosis present

## 2011-05-01 DIAGNOSIS — I2510 Atherosclerotic heart disease of native coronary artery without angina pectoris: Secondary | ICD-10-CM

## 2011-05-01 DIAGNOSIS — Z9861 Coronary angioplasty status: Secondary | ICD-10-CM

## 2011-05-01 DIAGNOSIS — K219 Gastro-esophageal reflux disease without esophagitis: Secondary | ICD-10-CM | POA: Diagnosis present

## 2011-05-01 DIAGNOSIS — Z87891 Personal history of nicotine dependence: Secondary | ICD-10-CM

## 2011-05-01 DIAGNOSIS — Z01812 Encounter for preprocedural laboratory examination: Secondary | ICD-10-CM

## 2011-05-01 DIAGNOSIS — Z01818 Encounter for other preprocedural examination: Secondary | ICD-10-CM

## 2011-05-01 DIAGNOSIS — E119 Type 2 diabetes mellitus without complications: Secondary | ICD-10-CM | POA: Diagnosis present

## 2011-05-01 DIAGNOSIS — Z79899 Other long term (current) drug therapy: Secondary | ICD-10-CM

## 2011-05-01 DIAGNOSIS — Z833 Family history of diabetes mellitus: Secondary | ICD-10-CM

## 2011-05-01 DIAGNOSIS — Z7982 Long term (current) use of aspirin: Secondary | ICD-10-CM

## 2011-05-01 DIAGNOSIS — I10 Essential (primary) hypertension: Secondary | ICD-10-CM | POA: Diagnosis present

## 2011-05-01 DIAGNOSIS — Z8249 Family history of ischemic heart disease and other diseases of the circulatory system: Secondary | ICD-10-CM

## 2011-05-01 HISTORY — PX: OTHER SURGICAL HISTORY: SHX169

## 2011-05-01 HISTORY — PX: CORONARY ARTERY BYPASS GRAFT: SHX141

## 2011-05-01 LAB — POCT I-STAT 4, (NA,K, GLUC, HGB,HCT)
Glucose, Bld: 164 mg/dL — ABNORMAL HIGH (ref 70–99)
Glucose, Bld: 158 mg/dL — ABNORMAL HIGH (ref 70–99)
Glucose, Bld: 190 mg/dL — ABNORMAL HIGH (ref 70–99)
HCT: 26 % — ABNORMAL LOW (ref 39.0–52.0)
HCT: 26 % — ABNORMAL LOW (ref 39.0–52.0)
Hemoglobin: 10.5 g/dL — ABNORMAL LOW (ref 13.0–17.0)
Hemoglobin: 8.8 g/dL — ABNORMAL LOW (ref 13.0–17.0)
Hemoglobin: 9.5 g/dL — ABNORMAL LOW (ref 13.0–17.0)
Potassium: 3.9 mEq/L (ref 3.5–5.1)
Potassium: 4.7 mEq/L (ref 3.5–5.1)
Potassium: 3.6 mEq/L (ref 3.5–5.1)
Sodium: 136 mEq/L (ref 135–145)
Sodium: 137 mEq/L (ref 135–145)
Sodium: 142 mEq/L (ref 135–145)

## 2011-05-01 LAB — POCT I-STAT 3, ART BLOOD GAS (G3+)
Acid-Base Excess: 2 mmol/L (ref 0.0–2.0)
Acid-base deficit: 1 mmol/L (ref 0.0–2.0)
Acid-base deficit: 1 mmol/L (ref 0.0–2.0)
Bicarbonate: 28.6 mEq/L — ABNORMAL HIGH (ref 20.0–24.0)
Bicarbonate: 24.2 mEq/L — ABNORMAL HIGH (ref 20.0–24.0)
Bicarbonate: 25.6 mEq/L — ABNORMAL HIGH (ref 20.0–24.0)
O2 Saturation: 100 %
O2 Saturation: 100 %
O2 Saturation: 97 %
Patient temperature: 35.7
Patient temperature: 36.6
TCO2: 30 mmol/L (ref 0–100)
TCO2: 27 mmol/L (ref 0–100)
pCO2 arterial: 49.6 mmHg — ABNORMAL HIGH (ref 35.0–45.0)
pO2, Arterial: 317 mmHg — ABNORMAL HIGH (ref 80.0–100.0)

## 2011-05-01 LAB — POCT I-STAT, CHEM 8
BUN: 9 mg/dL (ref 6–23)
Calcium, Ion: 1.16 mmol/L (ref 1.12–1.32)
Creatinine, Ser: 0.6 mg/dL (ref 0.50–1.35)
TCO2: 23 mmol/L (ref 0–100)

## 2011-05-01 LAB — PLATELET COUNT: Platelets: 149 10*3/uL — ABNORMAL LOW (ref 150–400)

## 2011-05-01 LAB — BLOOD GAS, ARTERIAL
TCO2: 26.6 mmol/L (ref 0–100)
pCO2 arterial: 37.9 mmHg (ref 35.0–45.0)
pH, Arterial: 7.441 (ref 7.350–7.450)

## 2011-05-01 LAB — APTT: aPTT: 34 seconds (ref 24–37)

## 2011-05-01 LAB — HEMOGLOBIN AND HEMATOCRIT, BLOOD: HCT: 26.4 % — ABNORMAL LOW (ref 39.0–52.0)

## 2011-05-01 LAB — CBC
Hemoglobin: 9.4 g/dL — ABNORMAL LOW (ref 13.0–17.0)
MCH: 31.2 pg (ref 26.0–34.0)
MCHC: 35.1 g/dL (ref 30.0–36.0)
MCHC: 36 g/dL (ref 30.0–36.0)
MCV: 86.6 fL (ref 78.0–100.0)
Platelets: 104 10*3/uL — ABNORMAL LOW (ref 150–400)
Platelets: 146 10*3/uL — ABNORMAL LOW (ref 150–400)
RBC: 3.06 MIL/uL — ABNORMAL LOW (ref 4.22–5.81)
RDW: 13.4 % (ref 11.5–15.5)

## 2011-05-02 ENCOUNTER — Inpatient Hospital Stay (HOSPITAL_COMMUNITY): Payer: 59

## 2011-05-02 LAB — BASIC METABOLIC PANEL
CO2: 26 mEq/L (ref 19–32)
CO2: 27 mEq/L (ref 19–32)
Calcium: 7.9 mg/dL — ABNORMAL LOW (ref 8.4–10.5)
Calcium: 8.5 mg/dL (ref 8.4–10.5)
Creatinine, Ser: 0.58 mg/dL (ref 0.50–1.35)
GFR calc Af Amer: >60 mL/min (ref >60)
GFR calc non Af Amer: >60 mL/min (ref >60)
Glucose, Bld: 110 mg/dL — ABNORMAL HIGH (ref 70–99)
Sodium: 139 mEq/L (ref 135–145)
Sodium: 135 mEq/L (ref 135–145)

## 2011-05-02 LAB — CBC
Hemoglobin: 9.9 g/dL — ABNORMAL LOW (ref 13.0–17.0)
MCH: 30.8 pg (ref 26.0–34.0)
MCH: 30.7 pg (ref 26.0–34.0)
MCHC: 34.3 g/dL (ref 30.0–36.0)
MCV: 88.5 fL (ref 78.0–100.0)
Platelets: 122 10*3/uL — ABNORMAL LOW (ref 150–400)
RBC: 3.21 MIL/uL — ABNORMAL LOW (ref 4.22–5.81)
RBC: 3.35 MIL/uL — ABNORMAL LOW (ref 4.22–5.81)
RDW: 14 % (ref 11.5–15.5)

## 2011-05-02 LAB — GLUCOSE, CAPILLARY
Glucose-Capillary: 113 mg/dL — ABNORMAL HIGH (ref 70–99)
Glucose-Capillary: 125 mg/dL — ABNORMAL HIGH (ref 70–99)
Glucose-Capillary: 119 mg/dL — ABNORMAL HIGH (ref 70–99)
Glucose-Capillary: 116 mg/dL — ABNORMAL HIGH (ref 70–99)
Glucose-Capillary: 123 mg/dL — ABNORMAL HIGH (ref 70–99)
Glucose-Capillary: 174 mg/dL — ABNORMAL HIGH (ref 70–99)
Glucose-Capillary: 160 mg/dL — ABNORMAL HIGH (ref 70–99)
Glucose-Capillary: 169 mg/dL — ABNORMAL HIGH (ref 70–99)

## 2011-05-02 LAB — MAGNESIUM: Magnesium: 2.2 mg/dL (ref 1.5–2.5)

## 2011-05-02 NOTE — Op Note (Signed)
[name] [name], [name] [name] NO.:  [id number]  MEDICAL RECORD NO.:  [id number]  LOCATION:  2301                         FACILITY:  [facility]  PHYSICIAN:  Salvatore Decent. [name], M.D. DATE OF BIRTH:  26-Jun-1951  DATE OF PROCEDURE:  05/01/2011 DATE OF DISCHARGE:                              OPERATIVE REPORT   PREOPERATIVE DIAGNOSIS:  Severe three-vessel coronary artery disease.  POSTOPERATIVE DIAGNOSIS:  Severe three-vessel coronary artery disease.  PROCEDURE:  Median sternotomy for coronary artery bypass grafting x4 using left internal mammary artery to distal left anterior descending coronary artery, left radial artery to obtuse marginal branch of the left circumflex coronary artery, saphenous vein graft to first diagonal branch of the left anterior descending coronary artery, saphenous vein graft to posterior descending coronary artery, endoscopic saphenous vein harvest from left thigh.  SURGEON:  Salvatore Decent. [name], MD  ASSISTANT:  Doree Fudge, PA  SECOND ASSISTANT:  Sol Blazing, PA  ANESTHESIA:  Burna Forts, MD  BRIEF CLINICAL NOTE:  The patient is a 60 year old male with history of coronary artery disease, hypertension, hyperlipidemia, and type 2 diabetes mellitus.  The patient underwent routine followup stress test in late August that was abnormal, notable for development of ST-segment depression in the inferior and lateral leads.  Nuclear images confirmed evidence suggesting ischemia.  The elective catheterization was performed demonstrating severe three-vessel coronary artery disease with normal left ventricular function.  A full consultation note has been dictated previously.  The patient has been counseled regarding the indications, risks, and potential benefits of surgery.  Alternative treatment strategies have been discussed.  The patient understands and accepts all associated risks of surgery and desires to proceed  as described.  OPERATIVE FINDINGS: 1. Normal left ventricular systolic function. 2. Mild aortic insufficiency. 3. Good-quality left internal mammary artery and left radial artery     conduit for grafting. 4. Good-quality saphenous vein conduit for grafting. 5. Good-quality target vessels for grafting with the exception of the     posterior descending coronary artery that was somewhat diffusely     diseased.  OPERATIVE PROCEDURE IN DETAIL:  The patient was brought to the operating room on the above-mentioned date and central monitoring was established by the Anesthesia team under the care and direction of Dr. Sharee Holster.  Specifically, a Swan-Ganz catheter was placed through the right internal jugular approach.  A radial arterial line was placed. Intravenous antibiotics were administered.  The patient was placed in the supine position on the operating table.  General endotracheal anesthesia was induced uneventfully.  The patient's chest, left upper extremity, abdomen, both groins, and both lower extremities were prepared and draped in a sterile manner.  Baseline transesophageal echocardiogram was performed by Dr. Jacklynn Bue.  This demonstrates normal left ventricular function.  There was mild aortic insufficiency.  The jet of aortic insufficiency was eccentric.  The aortic valve is tricuspid.  No other significant abnormalities are noted.  Left radial artery is removed from the volar aspect of the left forearm using direct open harvest technique and the harmonic scalpel for division of all side branches of the artery and its associated veins. Simultaneously, saphenous vein was obtained from the patient's left  thigh using endoscopic vein harvest technique through a small incision made just of the left knee.  An incision was made initially on the right side, but the vein on that side was felt to be somewhat small and was not removed.  The saphenous vein that was removed from the  left thigh was notably good-quality conduit.  The left radial artery was notably good-quality conduit.  Simultaneously, a median sternotomy incision was performed and the left internal mammary artery was dissected from the chest wall and prepared for bypass grafting.  The left internal mammary artery was also good-quality conduit.  The left forearm incision was closed with running absorbable suture in routine fashion.  Both small incisions and both lower extremities were closed with absorbable suture. Following systemic heparinization, the left internal mammary artery was transected distally and noted to have excellent flow.  Pericardium was opened.  The ascending aorta was normal in appearance. The ascending aorta and the right atrium were cannulated for cardiopulmonary bypass.  Cardiopulmonary bypass was begun and the surface of heart was inspected.  Distal target vessels were selected for coronary artery bypass grafting.  A temperature probe was placed in the left ventricular septum and a cardioplegic cannula was placed directly in the ascending aorta.  The patient was allowed to cool passively to 32 degrees systemic temperature.  The aortic crossclamp was applied and cold blood cardioplegia was delivered in an antegrade fashion through the aortic root.  Iced saline slush was applied for topical hypothermia. The initial cardioplegic arrest was rapid with early diastolic arrest. Repeat doses of cardioplegia were administered throughout the crossclamp portion of the operation.  Through the aortic root and down the subsequently, we placed vein grafts to maintain completely flat electrocardiogram and the left ventricular septal myocardial temperature below 15 degrees centigrade.  The following distal coronary anastomoses were performed: 1. The posterior descending coronary artery was grafted with a     saphenous vein graft in an end-to-side fashion.  This vessel was     somewhat  diffusely diseased.  It measured 1.5 mm at the site of     distal grafting. 2. The diagonal branch of the left anterior descending coronary artery     was grafted with saphenous vein graft in an end-to-side fashion.     This vessel measured 1.5 mm at the site of distal grafting as a     fair-to-good quality target vessel.  It was diffusely diseased     proximally. 3. The obtuse marginal branch off the left circumflex coronary artery     was grafted with left radial artery in an end-to-side fashion.     This vessel measured 2.0 mm in diameter and was a good-quality     target vessel for grafting. 4. The distal left anterior descending coronary artery was grafted     with left internal mammary artery in an end-to-side fashion.  This     vessel measured 1.8 mm in diameter and was a good-quality target     vessel for grafting.  All 3 proximal anastomoses were performed directly to the ascending aorta prior to removal of the aortic crossclamp.  The left ventricular septal temperature rises rapidly with reperfusion of the left internal mammary artery graft.  The aortic crossclamp was removed after a total crossclamp time of 74 minutes.  The heart began to beat spontaneously without need for cardioversion.  Epicardial pacing wires were fixed to the right ventricular free wall and to the right atrial appendage.  All proximal and distal coronary anastomoses were inspected for hemostasis and appropriate graft orientation.  The patient was rewarmed to 37 degrees centigrade temperature.  The patient was weaned from cardiopulmonary bypass without difficulty.  The patient's rhythm at separation from bypass was sinus bradycardia.  Atrial pacing was employed to increase the heart rate.  No inotropic support was required.  The venous and arterial cannulae were both removed uneventfully. Protamine was administered to reverse the anticoagulation.  The mediastinum in the left pleural space were irrigated  with saline solution.  Meticulous surgical hemostasis was ascertained.  The mediastinum and left pleural space were drained with 3 chest tubes placed through separate stab incisions inferiorly.  The pericardium and soft tissues anterior to the aorta were reapproximated loosely.  The sternum was closed using double-strength sternal wire.  The soft tissues anterior to the sternum were closed in multiple layers and the skin is closed using a running subcuticular skin closure.  The patient tolerated the procedure well and was transported to surgical intensive care unit in stable condition.  There were no intraoperative complications.  All sponge, instrument, and needle counts were verified correct at the completion of the operation.  No blood products were administered.     Salvatore Decent. [name], M.D.     CHO/MEDQ  D:  05/01/2011  T:  05/01/2011  Job:  [id number]  cc:   Luis Abed, MD, [facility] Arturo Morton. Riley Kill, MD, [facility] Titus Dubin. Alwyn Ren, MD,FACP,FCCP  Electronically Signed by Tressie Stalker M.D. on 05/02/2011 04:54:09 PM

## 2011-05-03 ENCOUNTER — Inpatient Hospital Stay (HOSPITAL_COMMUNITY): Payer: 59

## 2011-05-03 LAB — CBC
MCH: 31 pg (ref 26.0–34.0)
MCHC: 34.4 g/dL (ref 30.0–36.0)
MCV: 90.1 fL (ref 78.0–100.0)
Platelets: 138 10*3/uL — ABNORMAL LOW (ref 150–400)
RBC: 3.23 MIL/uL — ABNORMAL LOW (ref 4.22–5.81)

## 2011-05-03 LAB — BASIC METABOLIC PANEL
BUN: 12 mg/dL (ref 6–23)
CO2: 30 mEq/L (ref 19–32)
Calcium: 8.5 mg/dL (ref 8.4–10.5)
GFR calc non Af Amer: >60 mL/min (ref >60)
Glucose, Bld: 164 mg/dL — ABNORMAL HIGH (ref 70–99)
Sodium: 135 mEq/L (ref 135–145)

## 2011-05-03 LAB — GLUCOSE, CAPILLARY
Glucose-Capillary: 168 mg/dL — ABNORMAL HIGH (ref 70–99)
Glucose-Capillary: 175 mg/dL — ABNORMAL HIGH (ref 70–99)
Glucose-Capillary: 204 mg/dL — ABNORMAL HIGH (ref 70–99)

## 2011-05-04 ENCOUNTER — Inpatient Hospital Stay (HOSPITAL_COMMUNITY): Payer: 59

## 2011-05-04 LAB — BASIC METABOLIC PANEL
CO2: 29 mEq/L (ref 19–32)
Chloride: 96 mEq/L (ref 96–112)
GFR calc Af Amer: >90 mL/min (ref >90)
Potassium: 4.2 mEq/L (ref 3.5–5.1)

## 2011-05-04 LAB — CBC
HCT: 29.7 % — ABNORMAL LOW (ref 39.0–52.0)
Platelets: 151 10*3/uL (ref 150–400)
RBC: 3.31 MIL/uL — ABNORMAL LOW (ref 4.22–5.81)
RDW: 13.8 % (ref 11.5–15.5)
WBC: 10 10*3/uL (ref 4.0–10.5)

## 2011-05-04 LAB — GLUCOSE, CAPILLARY
Glucose-Capillary: 145 mg/dL — ABNORMAL HIGH (ref 70–99)
Glucose-Capillary: 220 mg/dL — ABNORMAL HIGH (ref 70–99)
Glucose-Capillary: 190 mg/dL — ABNORMAL HIGH (ref 70–99)
Glucose-Capillary: 288 mg/dL — ABNORMAL HIGH (ref 70–99)
Glucose-Capillary: 155 mg/dL — ABNORMAL HIGH (ref 70–99)

## 2011-05-05 ENCOUNTER — Inpatient Hospital Stay (HOSPITAL_COMMUNITY): Payer: 59

## 2011-05-05 LAB — BASIC METABOLIC PANEL
CO2: 29 mEq/L (ref 19–32)
Chloride: 97 mEq/L (ref 96–112)
GFR calc non Af Amer: >90 mL/min (ref >90)
Glucose, Bld: 147 mg/dL — ABNORMAL HIGH (ref 70–99)
Potassium: 3.6 mEq/L (ref 3.5–5.1)
Sodium: 136 mEq/L (ref 135–145)

## 2011-05-05 LAB — CBC
HCT: 28.5 % — ABNORMAL LOW (ref 39.0–52.0)
Hemoglobin: 9.9 g/dL — ABNORMAL LOW (ref 13.0–17.0)
RBC: 3.18 MIL/uL — ABNORMAL LOW (ref 4.22–5.81)
RDW: 13.8 % (ref 11.5–15.5)
WBC: 7.6 10*3/uL (ref 4.0–10.5)

## 2011-05-05 LAB — GLUCOSE, CAPILLARY
Glucose-Capillary: 117 mg/dL — ABNORMAL HIGH (ref 70–99)
Glucose-Capillary: 115 mg/dL — ABNORMAL HIGH (ref 70–99)
Glucose-Capillary: 123 mg/dL — ABNORMAL HIGH (ref 70–99)
Glucose-Capillary: 104 mg/dL — ABNORMAL HIGH (ref 70–99)

## 2011-05-07 LAB — CROSSMATCH
ABO/RH(D): A POS
Unit division: 0
Unit division: 0

## 2011-05-08 NOTE — Discharge Summary (Signed)
[name] [name], LOCKLIN NO.:  [id number]  MEDICAL RECORD NO.:  [id number]  LOCATION:  2013                         FACILITY:  [facility]  PHYSICIAN:  Salvatore Decent. [name], M.D. DATE OF BIRTH:  01/26/1951  DATE OF ADMISSION:  05/01/2011 DATE OF DISCHARGE:  05/05/2011                              DISCHARGE SUMMARY   HISTORY:  The patient is a 60 year old gentleman with history of coronary artery disease, hypertension, hyperlipidemia, and type 2 diabetes mellitus who had routine followup with nuclear stress test performed on April 01, 2011.  Stress test was notable for the development of ST-segment depression in the inferior and lateral leads during the stress portion.  Nuclear images confirmed findings consistent with inferior wall ischemia.  This then led to elective cardiac catheterization which was performed on April 07, 2011.  The patient was found to have severe 3-vessel coronary artery disease with normal left ventricular function.  He was referred to Tressie Stalker, MD for surgical consideration of coronary revascularization.  The patient denies any history of chest pain, chest pressure, chest tightness either with activity or at rest.  Since his catheterization, he has had some vague transient episodes of twinges in his chest of discomfort but he feels as though those may be related to anxiety.  He denied exertional shortness of breath, resting shortness of breath, paroxysmal nocturnal dyspnea, orthopnea, or lower extremity edema.  He has not had palpitations or syncope.  Dr. [name] has reviewed the patient's studies and evaluated the patient agreeing with recommendations for surgical coronary artery revascularization.  He was admitted this hospitalization for the procedure.  PAST MEDICAL HISTORY: 1. Episode of food poisoning, age 80. 2. Hypertension. 3. Hyperlipidemia. 4. Diabetes mellitus. 5. Coronary artery disease with previous drug-eluting stent of the    distal right coronary artery with history of anomalous right     coronary artery. 6. Aortic valve sclerosis by echocardiogram done in 2009. 7. History of premature ventricular contractions first diagnosed     November 2010. 8. History of carotid bruits with a 0-39% carotid artery stenosis by     Doppler on November 2010. 9. History of gastroesophageal reflux disease. 10.History of muscle aches related to Crestor with normal CPK.  PAST SURGICAL HISTORY: 1. Removal of the coccyx bone. 2. History of pilonidal cystectomy. 3. History of back surgery.  FAMILY HISTORY:  Coronary artery disease, hypertension, cancer in his maternal grandfather, site unknown, diabetes in his paternal aunt.  SOCIAL HISTORY:  He has never smoked.  He has never used smokeless tobacco, alcohol use is none.  MEDICATIONS PRIOR TO ADMISSION: 1. Aspirin 81 mg tablet daily. 2. Plavix 75 mg daily. 3. Omega-3 fish oil 1000 mg, 2 grams daily. 4. Amaryl 1 mg daily. 5. Hydrocodone/acetaminophen 5/325 mg, 1 tablet every 6 hours p.r.n. 6. Janumet 50/1000 mg 1 tablet twice daily with meals. 7. Altace 10 mg every day. 8. Zocor 20 mg at bedtime.  ALLERGIES:  CRESTOR which causes myalgias.  REVIEW OF SYMPTOMS AND PHYSICAL EXAMINATION:  Please see the previously dictated history and physical done prior to admission.  HOSPITAL COURSE:  The patient was admitted electively and on May 01, 2011, he was taken to  the operating room where he underwent the following procedure:  Coronary artery bypass grafting x4.  The following grafts were placed. 1. Left internal mammary artery to the distal left anterior descending     coronary artery. 2. Left radial artery to the obtuse marginal branch of the left     circumflex coronary artery. 3. Saphenous vein graft to the first diagonal branch of the left     anterior descending coronary artery. 4. Fourth graft was a saphenous vein graft to the posterior descending      coronary artery.  OPERATIVE FINDINGS: 1. Normal left ventricular systolic function. 2. Mild aortic insufficiency. 3. Good quality left internal mammary artery and left radial artery     conduit for grafting. 4. Good quality saphenous vein conduit for grafting. 5. Good quality target vessels for grafting with the exception of the     posterior descending coronary artery that was somewhat diffusely     diseased.  He tolerated the procedure well, was taken to the     Surgical Intensive Care Unit in stable condition.  POSTOPERATIVE HOSPITAL COURSE:  The patient has done quite well.  He has remained neurologically intact.  He was extubated uneventfully.  All inotropic support was discontinued in the routine manner without difficulty.  All routine lines, monitors, and drainage devices have been discontinued in the standard fashion.  He does have an acute expected blood loss anemia.  His values have stabilized.  His most recent hemoglobin and hematocrit dated May 05, 2011 are 9.9 and 28.5 respectively.  Electrolytes, BUN, and creatinine are within normal limits, although he has required some potassium supplementation.  He has a moderate volume overload but has responded well to diuretics.  His diabetes has been under good control using initially intensive IV therapy with transition to his oral home regimen.  Incisions are healing well without evidence of infection.  He is tolerating routine cardiac rehabilitation phase I modalities using standard protocols.  Oxygen has been weaned and he maintains good saturations on room air.  He is tolerating routine pulmonary toilet.  His overall status is felt to be stable for discharge on today's date.  CONDITION ON DISCHARGE:  Stable and improving.  MEDICATIONS AT DISCHARGE: 1. Lasix 40 mg daily for 4 additional days. 2. Mucinex 600 mg b.i.d. p.r.n. for cough. 3. Lopressor 50 mg twice daily. 4. Oxycodone 5 mg 1-2 every 4-6 hours p.r.n. for  pain. 5. Potassium chloride 20 mEq daily for 4 days. 6. Enteric-coated aspirin 325 mg daily. 7. Fish oil 1200 mg tablet daily. 8. Amaryl 1 mg daily. 9. Janumet 12/998 mg tablet twice daily. 10.Simvastatin 20 mg daily at bedtime.  INSTRUCTIONS:  The patient will receive written instructions regarding medications, activity, diet, wound care, and followup to include Dr. [name] on May 25, 2011 at 12:00 p.m.  Additionally, he is instructed to follow up with Dr. Myrtis Ser in 2 weeks post discharge.  FINAL DIAGNOSES:  Severe multivessel coronary artery disease as described above, now status post surgical revascularization.  OTHER DIAGNOSES:  Postoperative acute blood loss anemia, postoperative volume overload, history of hypertension, history of hyperlipidemia, history of type 2 diabetes mellitus, history of aortic valve sclerosis with mild aortic insufficiency, history of premature ventricular contraction, history of extracranial cerebrovascular occlusive disease with 0-39% carotid stenoses in November 2010, history of gastroesophageal reflux disease, history of Crestor intolerance.     Rowe Clack, P.A.-C.   ______________________________ Salvatore Decent [name], M.D.    Sherryll Burger  D:  05/05/2011  T:  05/05/2011  Job:  [id number]  cc:   Salvatore Decent. [name], M.D. Titus Dubin. Alwyn Ren, MD,FACP,FCCP Luis Abed, MD, [facility]  Electronically Signed by Gershon Crane P.A.-C. on 05/07/2011 10:30:55 AM Electronically Signed by Tressie Stalker M.D. on 05/08/2011 09:01:53 AM

## 2011-05-13 ENCOUNTER — Other Ambulatory Visit: Payer: 59 | Admitting: *Deleted

## 2011-05-13 ENCOUNTER — Other Ambulatory Visit: Payer: Self-pay | Admitting: Internal Medicine

## 2011-05-18 NOTE — Op Note (Signed)
[name] [name], [name] [name] NO.:  [id number]  MEDICAL RECORD NO.:  [id number]  LOCATION:  2301                         FACILITY:  [facility]  PHYSICIAN:  Burna Forts, M.D.DATE OF BIRTH:  1950/10/23  DATE OF PROCEDURE:  05/01/2011 DATE OF DISCHARGE:                              OPERATIVE REPORT   PROCEDURE:  Intraoperative transesophageal echocardiography.  INDICATIONS FOR PROCEDURE:  [name] is 60 year old gentleman who presents today for coronary artery bypass grafting by Dr. Tressie Stalker. He is brought to the holding area the morning of surgery where under local anesthesia with sedation, pulmonary artery catheter, and radial arterial lines were placed.  He was then transferred to the operating room for routine induction of general anesthesia, after which, the TEE probe is prepared and inserted oropharyngeally to the stomach, then slightly withdrawn for imaging of the cardiac structures.  PRECARDIOPULMONARY BYPASS TEE EXAMINATION:  Left ventricle.  The left ventricular chamber is seen initially in the short axis view.  It appears to be a normal left ventricular chamber in size wall thickness and function with an estimated ejection fraction of 50%.  Long-axis view of the left ventricular chamber again shows a normal inferior wall contractility and normal anterior wall contractility.  Overall, this is a normal left ventricular chamber.  Mitral valve:  The mitral valve is seen in several views.  It is normal in function, form and coaptation.  Color Doppler revealed a very slight mitral regurgitation jet.  Left atrium:  Normal left atrial chambers appreciated.  Right atrium:  Right atrial chamber is normal in appearance and size.  Aortic valve:  The aortic valve is seen initially in three cusps.  These are thin, compliant, and mobile.  There appears to be a mobile, small, nodular attachment to the aortic surface of one of the cusps that is seen especially  in the long-axis view.  This is a normal variant.  Color Doppler in a long-axis view, we are also able to see a small eccentric jet of aortic insufficiency.  It appears to the very periphery of the annular area during this long-axis view.  More close scrutiny of this reveals that in the area of the left and right cusps where they meet the aortic annulus, there is a slight defect such that these two cusps do not approximate.  This results in a small nonsignificant aortic insufficiency jet seen in this long-axis view.  The right ventricle and right atrium were normal structures.  Tricuspid valve is normal structure.  The patient was placed on cardiopulmonary bypass.  Coronary artery bypass grafting is carried out.  The patient is then rewarmed and separated from cardiopulmonary bypass with initial attempt.  POSTCARDIOPULMONARY BYPASS TEE EXAMINATION, LIMITED EXAM:  Left ventricle:  Left ventricular chamber is seen initially in short axis view.  It has good volume, good contractility, and normal in its function.  The rest of the cardiac examination was essentially unchanged from the prebypass period and the patient was returned to the Cardiac Intensive Care Unit in stable condition.          ______________________________ Burna Forts, M.D.     JTM/MEDQ  D:  05/01/2011  T:  05/01/2011  Job:  [id number]  Electronically Signed by Ester Rink M.D. on 05/18/2011 08:02:55 AM

## 2011-05-20 ENCOUNTER — Other Ambulatory Visit: Payer: Self-pay | Admitting: Thoracic Surgery (Cardiothoracic Vascular Surgery)

## 2011-05-20 DIAGNOSIS — I2510 Atherosclerotic heart disease of native coronary artery without angina pectoris: Secondary | ICD-10-CM

## 2011-05-21 ENCOUNTER — Ambulatory Visit (INDEPENDENT_AMBULATORY_CARE_PROVIDER_SITE_OTHER): Payer: Self-pay | Admitting: Internal Medicine

## 2011-05-21 ENCOUNTER — Encounter: Payer: Self-pay | Admitting: Internal Medicine

## 2011-05-21 ENCOUNTER — Encounter: Payer: Self-pay | Admitting: Thoracic Surgery (Cardiothoracic Vascular Surgery)

## 2011-05-21 VITALS — BP 122/78 | HR 74 | Temp 98.1°F | Wt 209.0 lb

## 2011-05-21 DIAGNOSIS — E119 Type 2 diabetes mellitus without complications: Secondary | ICD-10-CM

## 2011-05-21 NOTE — Patient Instructions (Signed)
Eat a low-fat diet with lots of fruits and vegetables, up to 7-9 servings per day. Avoid obesity; your goal is waist measurement < 40 inches.Consume less than 40 grams of sugar per day from foods & drinks with High Fructose Corn Sugar as #1,2,3 or # 4 on label. Follow the low carb nutrition program in [employer] as closely as possible to prevent Diabetes progression & complications. White carbohydrates (potatoes, rice, bread, and pasta) have a high spike of sugar and a high load of sugar. For example a  baked potato has a cup of sugar and a  french fry  2 teaspoons of sugar. Yams, wild  rice, whole grained bread &  wheat pasta have been much lower spike and load of  sugar. Portions should be the size of a deck of cards or your palm.  

## 2011-05-21 NOTE — Progress Notes (Signed)
Subjective:    Patient ID: [name] [name], male    DOB: 08/13/1950, 60 y.o.   MRN: [id number]  HPI Diabetes status assessment: Fasting or morning glucose range:  Not checked   . Highest glucose 2 hours after any meal:  Not checked. Hypoglycemia :  no .                                                     Excess thirst :no;  Excess hunger:  no ;  Excess urination:  no.                                  Lightheadedness with standing:  no. Chest pain:  no ; Palpitations :no ;  Pain in  calves with walking:  no .                                                                                                                                 Non healing skin  ulcers or sores,especially over the feet:  No ; sutures @ abdomen . Numbness or tingling or burning in feet : no .                                                                                                                                              Significant change in  Weight : loss of 43 since 9/11. Vision changes : no  .                                                                    Exercise : walking 15 min tice a day . Nutrition/diet:  No plan. Medication compliance : Janumet was changed to Kombiglyze < 3 weeks ago. Medication adverse  Effects:  no . Eye exam : Ophth 8/12; no retinopathy.  A1c/ urine microalbumin monitor:  >  3 mos ago       Review of Systems     Objective:   Physical Exam  He is in no acute distress; he appears somewhat weak and tired. He states he feels well.  He has a regular rhythm with an S4 and slight slurring.  Chest is clear with no increased work of breathing, rales, rhonchi or wheezes.  All pulses are intact without deficit or bruit  The surgical wounds of the abdomen show no signs of cellulitis. There is slight dehesence inferiorly  the most lateral right surgical wound  No pedal edema is present        Assessment & Plan:  #1 diabetes, questionable control. A1c will be  checked  #2 slight the essence of 1 surgical wound without signs of cellulitis or abscess. Surgical followup is scheduled 10/22

## 2011-05-22 LAB — MICROALBUMIN / CREATININE URINE RATIO
Creatinine,U: 154.2 mg/dL
Microalb, Ur: 0.8 mg/dL (ref 0.0–1.9)

## 2011-05-22 LAB — HEMOGLOBIN A1C: Hgb A1c MFr Bld: 5.8 % (ref 4.6–6.5)

## 2011-05-25 ENCOUNTER — Ambulatory Visit
Admission: RE | Admit: 2011-05-25 | Discharge: 2011-05-25 | Disposition: A | Discharge status: Home / Self Care | Payer: BC Managed Care – PPO | Source: Ambulatory Visit | Attending: Thoracic Surgery (Cardiothoracic Vascular Surgery) | Admitting: Thoracic Surgery (Cardiothoracic Vascular Surgery)

## 2011-05-25 ENCOUNTER — Ambulatory Visit (INDEPENDENT_AMBULATORY_CARE_PROVIDER_SITE_OTHER): Payer: Self-pay | Admitting: Thoracic Surgery (Cardiothoracic Vascular Surgery)

## 2011-05-25 ENCOUNTER — Encounter: Payer: Self-pay | Admitting: Thoracic Surgery (Cardiothoracic Vascular Surgery)

## 2011-05-25 VITALS — BP 119/74 | HR 74 | Resp 20 | Ht 68.75 in | Wt 211.0 lb

## 2011-05-25 DIAGNOSIS — I2510 Atherosclerotic heart disease of native coronary artery without angina pectoris: Secondary | ICD-10-CM

## 2011-05-25 DIAGNOSIS — Z951 Presence of aortocoronary bypass graft: Secondary | ICD-10-CM

## 2011-05-25 NOTE — Progress Notes (Signed)
  HPI: Patient returns for routine postoperative follow-up having undergone coronary artery bypass grafting x4 on 05/01/2011. The patient's early postoperative recovery while in the hospital was remarkably uncomplicated. Since hospital discharge the patient reports continued gradual improvement. The patient reports overall he is doing quite well. He has minimal residual soreness in his chest. The left anterior chest wall does feel sensitive and somewhat numb as is typical for left internal mammary artery harvested in grafting. His appetite has come around nicely. He is sleeping well at night. He has no shortness of breath. He reports that his blood sugars have not been under perfect control, but he has been to see his primary care physician and is making adjustments to get this under tighter control. Overall he is doing well and he has no complaints.   Current Outpatient Prescriptions  Medication Sig Dispense Refill  . aspirin 81 MG tablet Take 81 mg by mouth daily.        . fish oil-omega-3 fatty acids 1000 MG capsule Take 2 g by mouth daily.        [name] glimepiride (AMARYL) 1 MG tablet TAKE 1 TABLET BY MOUTH DAILY  30 tablet  0  . HYDROcodone-acetaminophen (NORCO) 5-325 MG per tablet 1 tablet every 6 (six) hours as needed.       . metoprolol (LOPRESSOR) 50 MG tablet Take 50 mg by mouth 2 (two) times daily.        . Saxagliptin-Metformin (KOMBIGLYZE XR) 12-998 MG TB24 12-998 mg daily with largest meal (replaces Janumet as per insurance). Labs needed after 10 weeks ( A1c, urine microalbumin, BUN, creat; 250.00,995.20)  30 tablet  2  . simvastatin (ZOCOR) 20 MG tablet Take 20 mg by mouth at bedtime.          Physical Exam: On physical exam the patient looks quite good. His sternal incision is healing nicely and the sternum is stable on palpation. Breath sounds are clear to auscultation and symmetrical bilaterally. Cardiovascular exam is notable for regular rate and rhythm. The abdomen is soft and  nontender. The extremities are warm and well-perfused. The small incision from endoscopic vein harvest on the left side is healing nicely. The incision on the left forearm from radial artery harvest is also healing reasonably well although it does have some dry eschar. No other abnormalities are noted.  Diagnostic Tests: Chest x-ray performed today is reviewed. This reveals clear lung fields bilaterally. There are no significant pleural effusions. All of the sternal wires appear intact.  Impression: The patient is recovering quite nicely following recent coronary artery bypass grafting.  Plan: I've strongly encouraged the patient to get enrolled in the cardiac rehabilitation program. I've encouraged him to continue to gradually increase his physical activity as tolerated with his only limitations at this point remaining that he refrain from any heavy lifting or strenuous use of his onto shoulders. We have not made any changes to his current medications. All of his questions have been addressed. He has been reminded to schedule an appointment for routine followup with his cardiologist. He has also been reminded how important it will remain for him to continue to maintain very tight control of both diabetes and hyperlipidemia.

## 2011-05-25 NOTE — Patient Instructions (Signed)
The patient has been instructed that they may return driving an automobile as long as they are no longer requiring oral narcotic pain relievers during the daytime.  They have been advised to start driving short distances during the daylight and gradually increase from there as they feel comfortable.The patient has been reminded to continue to avoid any heavy lifting or strenuous use of arms or shoulders for at least a total of three months from the time of surgery. The patient has been reminded to schedule an appointment with his regular cardiologist for followup.

## 2011-06-01 ENCOUNTER — Telehealth: Payer: Self-pay | Admitting: Internal Medicine

## 2011-06-01 NOTE — Telephone Encounter (Signed)
Pt advise to stop all DM med per Dr Alwyn Ren instruction on labs and monitor BS fasting or morning glucose goal of 90-150. Two hours after any meal , goal = < 180, preferably < 150. Pt ok info and verbalized understanding.

## 2011-06-08 ENCOUNTER — Other Ambulatory Visit: Payer: Self-pay | Admitting: Cardiology

## 2011-06-08 DIAGNOSIS — I6529 Occlusion and stenosis of unspecified carotid artery: Secondary | ICD-10-CM

## 2011-06-10 ENCOUNTER — Encounter: Payer: 59 | Admitting: Cardiology

## 2011-06-22 ENCOUNTER — Telehealth (HOSPITAL_COMMUNITY): Payer: Self-pay | Admitting: *Deleted

## 2011-06-24 ENCOUNTER — Telehealth: Payer: Self-pay

## 2011-06-24 DIAGNOSIS — R7309 Other abnormal glucose: Secondary | ICD-10-CM

## 2011-06-24 NOTE — Telephone Encounter (Signed)
Have A1c and urine microalbumin collected & then see me with the glucose records (790.29)

## 2011-06-24 NOTE — Telephone Encounter (Signed)
Pt states that his DM meds were d/c last month and he was told call if fasting sugars began to rise. Pt advises that his sugars have been increasing, ranging between 177-243. He wants to know if he should restart DM medication. Please advise

## 2011-06-24 NOTE — Telephone Encounter (Signed)
Per Dr. Alwyn Ren, pt should take Janumet once daily with largest meal until he can get labs done and schedule appointment to see Dr. Alwyn Ren.   Pt aware of MD's note. Lab appointment scheduled and future orders placed

## 2011-06-26 ENCOUNTER — Other Ambulatory Visit: Payer: Self-pay | Admitting: Internal Medicine

## 2011-06-26 DIAGNOSIS — R7309 Other abnormal glucose: Secondary | ICD-10-CM

## 2011-06-29 ENCOUNTER — Other Ambulatory Visit: Payer: BC Managed Care – PPO

## 2011-07-01 ENCOUNTER — Other Ambulatory Visit: Payer: Self-pay | Admitting: Cardiology

## 2011-07-01 ENCOUNTER — Encounter: Payer: Self-pay | Admitting: *Deleted

## 2011-07-01 ENCOUNTER — Other Ambulatory Visit: Payer: Self-pay | Admitting: Physician Assistant

## 2011-07-02 ENCOUNTER — Other Ambulatory Visit (INDEPENDENT_AMBULATORY_CARE_PROVIDER_SITE_OTHER): Payer: BC Managed Care – PPO

## 2011-07-02 DIAGNOSIS — R7309 Other abnormal glucose: Secondary | ICD-10-CM

## 2011-07-02 LAB — MICROALBUMIN / CREATININE URINE RATIO: Microalb, Ur: 0.9 mg/dL (ref 0.0–1.9)

## 2011-07-02 NOTE — Progress Notes (Signed)
12  

## 2011-07-03 ENCOUNTER — Other Ambulatory Visit: Payer: Self-pay | Admitting: *Deleted

## 2011-07-03 MED ORDER — METOPROLOL TARTRATE 50 MG PO TABS: 50.0000 mg | ORAL_TABLET | Freq: Two times a day (BID) | ORAL | Status: DC

## 2011-08-05 ENCOUNTER — Other Ambulatory Visit: Payer: Self-pay | Admitting: Internal Medicine

## 2011-08-05 DIAGNOSIS — E119 Type 2 diabetes mellitus without complications: Secondary | ICD-10-CM

## 2011-08-05 MED ORDER — SAXAGLIPTIN-METFORMIN ER 5-1000 MG PO TB24: ORAL_TABLET | ORAL | Status: DC

## 2011-08-05 NOTE — Telephone Encounter (Signed)
Left message on voicemail for patient informing him appointment due per lab note (see 07/02/2011) labs, A1c 7.2 (previously 5.8). Per Dr.Hopper patient to be seen prior to filling meds.   30 day supply will be provided to allow patient time to schedule a f/u

## 2011-08-20 ENCOUNTER — Encounter: Payer: Self-pay | Admitting: Family Medicine

## 2011-08-20 ENCOUNTER — Ambulatory Visit (INDEPENDENT_AMBULATORY_CARE_PROVIDER_SITE_OTHER): Payer: BC Managed Care – PPO | Admitting: Family Medicine

## 2011-08-20 VITALS — BP 120/70 | HR 75 | Temp 98.9°F | Wt 208.0 lb

## 2011-08-20 DIAGNOSIS — R1031 Right lower quadrant pain: Secondary | ICD-10-CM

## 2011-08-20 DIAGNOSIS — R319 Hematuria, unspecified: Secondary | ICD-10-CM

## 2011-08-20 DIAGNOSIS — R81 Glycosuria: Secondary | ICD-10-CM

## 2011-08-20 DIAGNOSIS — IMO0001 Reserved for inherently not codable concepts without codable children: Secondary | ICD-10-CM

## 2011-08-20 LAB — POCT URINALYSIS DIPSTICK
Ketones, UA: NEGATIVE
Protein, UA: 300
Spec Grav, UA: >=1.03

## 2011-08-20 LAB — GLUCOSE, POCT (MANUAL RESULT ENTRY): POC Glucose: 285

## 2011-08-20 MED ORDER — CIPROFLOXACIN HCL 500 MG PO TABS: 500.0000 mg | ORAL_TABLET | Freq: Two times a day (BID) | ORAL | Status: AC

## 2011-08-20 NOTE — Patient Instructions (Signed)
Testicular Torsion In testicular torsion, the spermatic cord, artery and vein which go to the testicle are twisted. This cuts off the blood supply to everything in the scrotum. The scrotum is the pouch (sac) that contains the testes, blood vessels, and part of the spermatic cord. The main symptom of testicular torsion is pain in the testicle. This condition is an emergency. If the torsion lasts too long, it will result in the death of the testicle and surrounding tissues.  The most common type of testicular torsion (accounting for 19 of 20 cases) peaks in the early teen years. The left testis is more frequently affected. Torsion is the most common cause of scrotal or testicular pain in non-sexually active adolescents. The less common type (only 1 in 20 of those affected by testicular torsion) occurs before birth. It is linked to high birth weight. CAUSES  Some men may be likely to get testicular torsion because they have less connective tissue in the scrotum. The condition can also result from injury to the scrotum, more commonly if swelling is present. It may also occur after strenuous exercise or have no obvious cause.  SYMPTOMS   Scrotal swelling, one sided.   Light headedness or fainting.   Pain with urination.   Testicular lump or swelling.   Sudden onset of testicle pain (in one or both testicles) with or without a predisposing event.   Pain with intercourse or painful ejaculation.   Blood in the semen.   Nausea or vomiting.   Extreme tenderness with pressure or pull on the testis.   In a newborn or infant male, there is a firm, hard, scrotal mass; but the baby seems fine otherwise. The skin of the scrotum also seems stuck to the testicle.  DIAGNOSIS   Your caregiver can often diagnose testicular torsion by just examining you.   An ultrasound scan of the scrotum, if available, may be done to confirm the diagnosis.   Other testing is sometimes required. This may include a special  imaging test using a very low power radioactive material that is safe and rapidly cleared from the body.  TREATMENT   Surgery is usually necessary and should be performed as soon as possible after symptoms begin. If surgery is performed within 6 hours, most testicles can be saved. If surgery is delayed more than 6 hours, the testicle will often need to be removed. Even with less than 6 hours of torsion, the testicle may lose it's ability to function.   During surgery, the testicle on the other (non-affected) side is usually also anchored as a preventive measure. This is because the non-affected testicle is at risk of testicular torsion in the future.  PREVENTION  Most cases are not preventable. If the problem has occurred and affected only one testicle, then there is significant risk for the same happening to the other testicle. Operations can be done to protect the other side and help prevent the same thing from happening again. PROGNOSIS With proper rapid diagnosis and good treatment, normal function of the testicle is usually preserved. If testicular torsion is not surgically corrected promptly, infertility from loss of function and testicular shrinkage (atrophy) may result. If the blood supply to the testicle has been cut off for a prolonged period of time, it may cause the testicle to shrink (atrophy). Atrophy of the testicle may occur days to months after the torsion has been corrected. Severe infection of the testicle and scrotum is another potential complication if the blood flow is  restricted for a prolonged period. Document Released: 07/20/2005 Document Revised: 04/01/2011 Document Reviewed: 10/04/2006 [facility] Patient Information 2012 [place], [place].

## 2011-08-20 NOTE — Progress Notes (Signed)
  Subjective:    Patient ID: [name] [name], male    DOB: 11-03-1950, 61 y.o.   MRN: [id number]  HPI Pt here c/o R groin pain x 2 days ---pt can't sit or walk etc without pain.   Blood sugars have been high as well.  No back pain or fevers.    Review of Systems    as above Objective:   Physical Exam  Constitutional: He is oriented to person, place, and time. He appears well-developed and well-nourished. He appears distressed.  Genitourinary:       + severe pain with palpation of R testicle + swelling   Neurological: He is alert and oriented to person, place, and time.  Psychiatric: He has a normal mood and affect. His behavior is normal. Judgment and thought content normal.          Assessment & Plan:  R testicular pain--- r/t torsion--pt to see urology now                              cipro sent to pharmacy                             Urine culture sent

## 2011-08-23 LAB — URINE CULTURE: Colony Count: >=100000

## 2011-08-31 ENCOUNTER — Encounter: Payer: Self-pay | Admitting: Thoracic Surgery (Cardiothoracic Vascular Surgery)

## 2011-08-31 ENCOUNTER — Ambulatory Visit (INDEPENDENT_AMBULATORY_CARE_PROVIDER_SITE_OTHER): Payer: BC Managed Care – PPO | Admitting: Thoracic Surgery (Cardiothoracic Vascular Surgery)

## 2011-08-31 VITALS — BP 116/73 | HR 58 | Resp 16 | Ht 68.75 in | Wt 205.0 lb

## 2011-08-31 DIAGNOSIS — I2510 Atherosclerotic heart disease of native coronary artery without angina pectoris: Secondary | ICD-10-CM

## 2011-08-31 DIAGNOSIS — Z951 Presence of aortocoronary bypass graft: Secondary | ICD-10-CM

## 2011-08-31 NOTE — Progress Notes (Signed)
[address]            [place] [address]          [phone]     CARDIOTHORACIC SURGERY CONSULTATION REPORT  PCP is Marga Melnick, MD, MD Referring Provider is Jerral Bonito, MD  Chief Complaint  Patient presents with  . Routine Post Op    3 month f/u with CXR, S/P CABG x 4    HPI:  Patient returns for followup 4 months status post coronary artery bypass grafting. He has done very well although he was treated recently for a urinary tract infection. From a cardiovascular standpoint he has had no problems whatsoever. He did not participate in the cardiac rehabilitation program because of the excessive costs. However, he reports his exercise tolerance is improving nicely and overall was doing fine. He hopes to increase his physical activity but he is been waiting for clearance from [place] with regards to his previous sternotomy. He denies any exertional chest discomfort or shortness of breath worrisome for recurrent angina. He reports that his blood sugars have been under reasonably good control. Overall he feels well other than the setback he experienced recently with his urinary tract infection.  Past Medical History  Diagnosis Date  . Food poisoning     age 58  . Rash   . Hypertension   . Hyperlipidemia   . Diabetes mellitus   . 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 and coursed between the pulmonary artery and aorta anterior to the aorta. EF 55% cath-01/24/03 60% echo June 2009  . Aortic valve sclerosis     echo june 2009  . PVC (premature ventricular contraction) 06/2009    November, 2010  . Carotid bruit 06/2009    Doppler, November, 2010, 0-39% bilateral  . GERD (gastroesophageal reflux disease)   . Muscle ache     on crestor  09/04/09 CPK 193(7-232) tolerated simvastin in the past  . Ejection fraction    Ejection fraction 60%, echo, June, 2009    Past Surgical History  Procedure Date  . Coccyx bone removed   . Pilonidal cystectomy   . Back surgery   . Intraoperative transesophageal echocardiography. 05/01/2011  . Coronary artery bypass graft 05/01/2011    CABG x4 with LIMA to LAD, LRA to OM, SVG to D1, SVG to PDA, EVH via left thigh    Family History  Problem Relation Age of Onset  . Hypertension Father   . Heart attack Father     bypass  . Heart disease Father   . Coronary artery disease Father   . Heart attack Paternal Grandfather   . Cancer Maternal Grandfather     site unknown  . Diabetes Paternal Aunt   . Heart failure Maternal Grandmother     Social History History  Substance Use Topics  . Smoking status: Never Smoker   . Smokeless tobacco: Not on file  . Alcohol Use: No    Current Outpatient Prescriptions  Medication Sig Dispense Refill  . aspirin 81 MG tablet Take 81 mg by mouth daily.        . fish oil-omega-3 fatty acids 1000 MG capsule Take 500 g by mouth daily.       [name] HYDROcodone-acetaminophen (NORCO) 5-325 MG per tablet 1 tablet every 6 (  six) hours as needed.       . metoprolol (LOPRESSOR) 50 MG tablet Take 1 tablet (50 mg total) by mouth 2 (two) times daily.  60 tablet  6  . Saxagliptin-Metformin (KOMBIGLYZE XR) 12-998 MG TB24 12-998 mg daily with largest meal (replaces Janumet as per insurance). Labs needed after 10 weeks ( A1c, urine microalbumin, BUN, creat; 250.00,995.20)  30 tablet  0  . simvastatin (ZOCOR) 20 MG tablet Take 20 mg by mouth at bedtime.        . ciprofloxacin (CIPRO) 500 MG tablet Take 1 tablet (500 mg total) by mouth 2 (two) times daily.  20 tablet  0    Allergies  Allergen Reactions  . Rosuvastatin     REACTION: myalgias    Review of Systems:  General:  normal appetite, normal energy   Respiratory:  no cough, no wheezing, no hemoptysis, no pain with inspiration or cough, no shortness of breath   Cardiac:   no chest pain or  tightness, no exertional SOB, no resting SOB, no PND, no orthopnea, no LE edema, no palpitations, no syncope  GI:   no difficulty swallowing, no hematochezia, no hematemesis, no melena, no constipation, no diarrhea   GU:   + dysuria which has almost completely resolved  Musculoskeletal: no arthritis, no arthralgia   Vascular:  no pain suggestive of claudication   Neuro:   no symptoms suggestive of TIA's, no seizures, no headaches, no peripheral neuropathy   Endocrine:  Negative   HEENT:  no loose teeth or painful teeth,  no recent vision changes  Psych:   no anxiety, no depression    Physical Exam:   BP 116/73  Pulse 58  Resp 16  Ht 5' 8.75" (1.746 m)  Wt 205 lb (92.987 kg)  BMI 30.49 kg/m2  SpO2 96%  General:    well-appearing  HEENT:  Unremarkable   Neck:   no JVD, no bruits, no adenopathy   Chest:   clear to auscultation, symmetrical breath sounds, no wheezes, no rhonchi - his sternotomy incision has healed completely  CV:   RRR, no  murmur   Abdomen:  soft, non-tender, no masses   Extremities:  warm, well-perfused, incisions from left radial artery harvest and bilateral EVH have all healed   Rectal/GU  Deferred  Neuro:   Grossly non-focal and symmetrical throughout  Skin:   Clean and dry, no rashes, no breakdown  Diagnostic Tests:  n/a  Impression:  Patient is doing well 4 months status post coronary artery bypass grafting. All the surgical incisions are healed nicely.  Plan:  In the future the patient will call and return to see [place] as needed. He is been cleared to increase his physical activity as tolerated without any particular restrictions. All of his questions been answered.    Salvatore Decent. [name], MD 08/31/2011 10:59 AM

## 2011-09-02 ENCOUNTER — Encounter: Payer: Self-pay | Admitting: Cardiology

## 2011-09-02 DIAGNOSIS — Z951 Presence of aortocoronary bypass graft: Secondary | ICD-10-CM | POA: Insufficient documentation

## 2011-09-04 ENCOUNTER — Ambulatory Visit: Payer: BC Managed Care – PPO | Admitting: Cardiology

## 2011-09-30 ENCOUNTER — Other Ambulatory Visit: Payer: Self-pay | Admitting: Internal Medicine

## 2011-10-07 ENCOUNTER — Encounter: Payer: Self-pay | Admitting: Cardiology

## 2011-10-08 ENCOUNTER — Encounter: Payer: Self-pay | Admitting: Cardiology

## 2011-10-08 ENCOUNTER — Ambulatory Visit (INDEPENDENT_AMBULATORY_CARE_PROVIDER_SITE_OTHER): Payer: BC Managed Care – PPO | Admitting: Cardiology

## 2011-10-08 VITALS — BP 122/68 | HR 56 | Ht 69.0 in | Wt 216.0 lb

## 2011-10-08 DIAGNOSIS — I493 Ventricular premature depolarization: Secondary | ICD-10-CM

## 2011-10-08 DIAGNOSIS — I10 Essential (primary) hypertension: Secondary | ICD-10-CM

## 2011-10-08 DIAGNOSIS — Z951 Presence of aortocoronary bypass graft: Secondary | ICD-10-CM

## 2011-10-08 DIAGNOSIS — R0989 Other specified symptoms and signs involving the circulatory and respiratory systems: Secondary | ICD-10-CM

## 2011-10-08 DIAGNOSIS — E785 Hyperlipidemia, unspecified: Secondary | ICD-10-CM

## 2011-10-08 DIAGNOSIS — I2510 Atherosclerotic heart disease of native coronary artery without angina pectoris: Secondary | ICD-10-CM

## 2011-10-08 DIAGNOSIS — I4949 Other premature depolarization: Secondary | ICD-10-CM

## 2011-10-08 NOTE — Assessment & Plan Note (Signed)
The patient has mild carotid disease. I do not have the most recent Doppler done as a presurgical study. This will be obtained with the data put in the chart.

## 2011-10-08 NOTE — Progress Notes (Signed)
HPI Patient is seen today to followup coronary disease. He underwent CABG in May 01, 2011. He's done very well. He is now here for followup. He has some residual discomfort in his chest wall. His activity has increased. He's not having any ischemic symptoms. It is of note that he did not have any ischemic symptoms beforehis surgery. He had a markedly abnormal stress test. He has been seen by cardiac surgery. However he has not been here for followup. He is stable. I have reviewed all of his hospital records including the catheterization and the CABG report. I've also updated my recent records.  Allergies  Allergen Reactions  . Rosuvastatin     REACTION: myalgias    Current Outpatient Prescriptions  Medication Sig Dispense Refill  . aspirin 81 MG tablet Take 81 mg by mouth daily.        . fish oil-omega-3 fatty acids 1000 MG capsule Take 500 g by mouth daily.       [name] HYDROcodone-acetaminophen (NORCO) 5-325 MG per tablet 1 tablet every 6 (six) hours as needed.       [name] KOMBIGLYZE XR 12-998 MG TB24 TAKE 1 TABLET WITH LARGEST MEAL (NEEDS LABS AFTER 10 WEEKS)  30 tablet  0  . metoprolol (LOPRESSOR) 50 MG tablet Take 1 tablet (50 mg total) by mouth 2 (two) times daily.  60 tablet  6  . simvastatin (ZOCOR) 20 MG tablet Take 20 mg by mouth at bedtime.         History   Social History  . Marital Status: Single    Spouse Name: N/A    Number of Children: N/A  . Years of Education: N/A   Occupational History  . Emergency planning/management officer     Social History Main Topics  . Smoking status: Never Smoker   . Smokeless tobacco: Not on file  . Alcohol Use: No  . Drug Use: No  . Sexually Active: Not on file   Other Topics Concern  . Not on file   Social History Narrative  . No narrative on file    Family History  Problem Relation Age of Onset  . Hypertension Father   . Heart attack Father     bypass  . Heart disease Father   . Coronary artery disease Father   . Heart attack Paternal  Grandfather   . Cancer Maternal Grandfather     site unknown  . Diabetes Paternal Aunt   . Heart failure Maternal Grandmother     Past Medical History  Diagnosis Date  . Food poisoning     age 50  . Rash   . Hypertension   . Hyperlipidemia   . Diabetes mellitus   . 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 and coursed between the pulmonary artery and aorta anterior to the aorta. EF 55% cath-01/24/03 60% echo June 2009  . Aortic valve sclerosis     echo june 2009  . PVC (premature ventricular contraction) 06/2009    November, 2010  . Carotid bruit 06/2009    Doppler, November, 2010, 0-39% bilateral  . GERD (gastroesophageal reflux disease)   . Muscle ache     on crestor  09/04/09 CPK 193(7-232) tolerated simvastin in the past  . Ejection fraction     Ejection fraction 60%, echo, June, 2009  . Hx of CABG     [name],  x4  May 01, 2011 limited to the LAD, left radial artery to the OM, SVG to diagonal, SVG to posterior descending    Past Surgical History  Procedure Date  . Coccyx bone removed   . Pilonidal cystectomy   . Back surgery   . Intraoperative transesophageal echocardiography. 05/01/2011  . Coronary artery bypass graft 05/01/2011    CABG x4 with LIMA to LAD, LRA to OM, SVG to D1, SVG to PDA, EVH via left thigh    ROS  Patient denies fever, chills, headache, sweats, rash, change in vision, change in hearing, cough, nausea vomiting, urinary symptoms. All other systems are reviewed and are negative.  PHYSICAL EXAM Patient's oriented to person time and place. Affect is normal. There is no jugulovenous distention. Lungs are clear. Respiratory effort is nonlabored. Cardiac exam reveals S1 and S2. There no clicks or significant murmurs. He has some numbness in the left anterior chest. He also spasm soreness in the left upper ribs of his  anterior chest. There is no peripheral edema 2. There no musculoskeletal deformities.There no skin rashes. The radial surgical site in his left arm is healing nicely. We had a very careful discussion about this.  Filed Vitals:   10/08/11 1545  BP: 122/68  Pulse: 56  Height: 5\' 9"  (1.753 m)  Weight: 216 lb (97.977 kg)   EKG is done today and reviewed by me. He has sinus rhythm. There are nonspecific ST-T wave abnormalities. ASSESSMENT & PLAN

## 2011-10-08 NOTE — Assessment & Plan Note (Signed)
The patient's lipids are being treated. We are talking to him about possibly being part of a research study for rate HDL lowering drug. He is deciding.

## 2011-10-08 NOTE — Patient Instructions (Signed)
Your physician recommends that you schedule a follow-up appointment in: 3 months.  

## 2011-10-08 NOTE — Assessment & Plan Note (Signed)
The patient had a very advanced operation by Dr. [name] including LIMA to the LAD and left radial artery to the OM. He also received a vein graft to the diagonal and vein graft to the posterior descending. He is doing well. He has some persistent soreness in the left anterior chest. This will be followed over time.

## 2011-10-08 NOTE — Assessment & Plan Note (Signed)
Blood pressures control. No change in therapy. 

## 2011-10-08 NOTE — Assessment & Plan Note (Signed)
He has not been bothered by any significant ectopy. No further workup.

## 2011-10-12 ENCOUNTER — Other Ambulatory Visit: Payer: Self-pay

## 2011-10-12 MED ORDER — SIMVASTATIN 20 MG PO TABS: 20.0000 mg | ORAL_TABLET | Freq: Every day | ORAL | Status: DC

## 2011-10-12 NOTE — Telephone Encounter (Signed)
..   Requested Prescriptions   Signed Prescriptions Disp Refills  . simvastatin (ZOCOR) 20 MG tablet 30 tablet 11    Sig: Take 1 tablet (20 mg total) by mouth at bedtime.    Authorizing Provider: Willa Rough D    Ordering User: Christella Hartigan, Josclyn Rosales Judie Petit

## 2011-10-19 ENCOUNTER — Other Ambulatory Visit: Payer: Self-pay | Admitting: Cardiology

## 2011-10-26 ENCOUNTER — Other Ambulatory Visit: Payer: Self-pay | Admitting: Internal Medicine

## 2011-10-26 NOTE — Telephone Encounter (Signed)
Patient needs to schedule a lab appointment A1c 250.00, appointment to f/u in 3-5 days following lab appointment

## 2011-10-28 ENCOUNTER — Encounter: Payer: Self-pay | Admitting: Internal Medicine

## 2011-10-28 ENCOUNTER — Ambulatory Visit (INDEPENDENT_AMBULATORY_CARE_PROVIDER_SITE_OTHER): Payer: BC Managed Care – PPO | Admitting: Internal Medicine

## 2011-10-28 ENCOUNTER — Telehealth: Payer: Self-pay | Admitting: Internal Medicine

## 2011-10-28 VITALS — BP 120/74 | HR 64 | Temp 98.2°F | Wt 217.6 lb

## 2011-10-28 DIAGNOSIS — R5383 Other fatigue: Secondary | ICD-10-CM

## 2011-10-28 DIAGNOSIS — R5381 Other malaise: Secondary | ICD-10-CM

## 2011-10-28 DIAGNOSIS — Z951 Presence of aortocoronary bypass graft: Secondary | ICD-10-CM

## 2011-10-28 DIAGNOSIS — E785 Hyperlipidemia, unspecified: Secondary | ICD-10-CM

## 2011-10-28 DIAGNOSIS — E119 Type 2 diabetes mellitus without complications: Secondary | ICD-10-CM

## 2011-10-28 DIAGNOSIS — I10 Essential (primary) hypertension: Secondary | ICD-10-CM

## 2011-10-28 MED ORDER — SAXAGLIPTIN-METFORMIN ER 2.5-1000 MG PO TB24: 2.5000 mg | ORAL_TABLET | Freq: Two times a day (BID) | ORAL | Status: DC

## 2011-10-28 MED ORDER — SIMVASTATIN 20 MG PO TABS: 20.0000 mg | ORAL_TABLET | Freq: Every day | ORAL | Status: DC

## 2011-10-28 NOTE — Assessment & Plan Note (Signed)
Blood pressure is well controlled; no change in medicines indicated. 

## 2011-10-28 NOTE — Telephone Encounter (Signed)
Caller: [name] [name]/Patient; PCP: Marga Melnick ; CB#: [phone]; Call regarding Having Issues With His Sugar and Has Questions.; On Kombiglyze XR -1000; States he has been taking one in am and one after largest meal of the day; keeps watch on FSBG.  Was hospitalized Sept 2012 and was on Insulin. He was advised that he should stop this medication. A month later his blood sugar started climbing and he resumed taking one tablet per day.  Now his FSBG stays around 180 before breakfast; he states he is only able to take one per day as otherwise insurance will not cover him taking more.  Appointment w provider per Diabetes:  Control Problems protocol.

## 2011-10-28 NOTE — Patient Instructions (Signed)
Please  schedule fasting Labs : BMET,Lipids, hepatic panel, CBC & dif, TSH, A1c , urine microalbumin. PLEASE BRING THESE INSTRUCTIONS TO FOLLOW UP  LAB APPOINTMENT.This will guarantee correct labs are drawn, eliminating need for repeat blood sampling ( needle sticks ! ). Diagnoses /Codes: CAD, 272.4,250.02, 780.79

## 2011-10-28 NOTE — Assessment & Plan Note (Signed)
There has been apparent loss of blood sugar control on a single dose of the combination oral agent. A1c will be checked and the agent increased to twice a day.

## 2011-10-28 NOTE — Telephone Encounter (Signed)
Pt seen in  Office today

## 2011-10-28 NOTE — Progress Notes (Signed)
Subjective:    Patient ID: [name] [name], male    DOB: [birth year], 61 y.o.   MRN: [id number]  HPI Diabetes status assessment: Fasting or morning glucose range: 160-218 or average : 180+  . Highest glucose 2 hours after any meal: rarely checked. Hypoglycemia :  no .                                                     Excess thirst;   Hunger; urination:  no.                                  Lightheadedness with standing:  no. Chest pain: no but CBAG 9/28 ; Palpitations no ;  Pain in  calves with walking: no .                                                                                                                                 Non healing skin  ulcers or sores,especially over the feet:  no. Numbness or tingling or burning in feet : no .                                                                                                                                              Significant change in  Weight : stable. Vision changes : no .                                                                    Exercise : walking 3X/ week for 30 min or > . Nutrition/diet:  Low carb, no sugar. Medication compliance : on insulin in hospital 9/12; at discharge his oral diabetic medicines were stopped. In November 2012 his A1c was 7.2%. Kombiglyze XR was added once daily @ that time. Since November/12 his fasting sugars have been gradually increasing Medication adverse  Effects: no . Eye exam : Dr Coralie Common every 6 mos. Foot care : no.  A1c/ urine microalbumin monitor:  : 05/21/11 his A1c was 5.8% on diabetic. There was the month after his bypass surgery.    Review of Systems he describes fatigue at night but not with his exercise program. As noted weight is stable w/o abdominal pain, melena, rectal bleeding.  He denies any new changes in his hair, skin, nails. He denies hoarseness or constipation.  His last TSH was 1.42 on 06/2006.     Objective:   Physical Exam Gen.: Healthy  &  well-nourished, appropriate and alert, weight Eyes: No lid/conjunctival changes, extraocular motion intact Neck: Normal range of motion, thyroid normal Respiratory: No increased work of breathing or abnormal breath sounds Cardiac : regular rhythm, no extra heart sounds, gallop, Grade 1 / 6 murmur Abdomen: No organomegaly ,masses. Aorta bruit w/o aortic enlargement Lymph: No lymphadenopathy of the neck or axilla Skin: No rashes, lesions, ulcers or ischemic changes Muscle skeletal: no nail changes; joints Vasc:All pulses intact, no bruits present except aortic Neuro: Normal deep tendon reflexes, alert & oriented, sensation over feet normal to light touch Psych: judgment and insight, mood and affect normal         Assessment & Plan:  See problem list for assessment and recommendations.  Because of fatigue CBC and TSH will be checked

## 2011-10-28 NOTE — Assessment & Plan Note (Signed)
Last LDL was 104 and December 2010

## 2011-11-02 ENCOUNTER — Other Ambulatory Visit (INDEPENDENT_AMBULATORY_CARE_PROVIDER_SITE_OTHER): Payer: BC Managed Care – PPO

## 2011-11-02 DIAGNOSIS — IMO0001 Reserved for inherently not codable concepts without codable children: Secondary | ICD-10-CM

## 2011-11-02 DIAGNOSIS — R5383 Other fatigue: Secondary | ICD-10-CM

## 2011-11-02 DIAGNOSIS — R5381 Other malaise: Secondary | ICD-10-CM

## 2011-11-02 DIAGNOSIS — E785 Hyperlipidemia, unspecified: Secondary | ICD-10-CM

## 2011-11-02 LAB — BASIC METABOLIC PANEL
CO2: 27 mEq/L (ref 19–32)
Calcium: 9 mg/dL (ref 8.4–10.5)
Chloride: 102 mEq/L (ref 96–112)
Sodium: 137 mEq/L (ref 135–145)

## 2011-11-02 LAB — MICROALBUMIN / CREATININE URINE RATIO
Creatinine,U: 202.8 mg/dL
Microalb Creat Ratio: 0.6 mg/g (ref 0.0–30.0)

## 2011-11-02 LAB — CBC WITH DIFFERENTIAL/PLATELET
Basophils Absolute: 0 10*3/uL (ref 0.0–0.1)
Eosinophils Absolute: 0.1 10*3/uL (ref 0.0–0.7)
Hemoglobin: 13.7 g/dL (ref 13.0–17.0)
Lymphocytes Relative: 21.3 % (ref 12.0–46.0)
MCHC: 34.1 g/dL (ref 30.0–36.0)
Monocytes Absolute: 0.6 10*3/uL (ref 0.1–1.0)
Neutro Abs: 4.1 10*3/uL (ref 1.4–7.7)
RDW: 14.6 % (ref 11.5–14.6)

## 2011-11-02 LAB — HEPATIC FUNCTION PANEL
ALT: 19 U/L (ref 0–53)
Alkaline Phosphatase: 41 U/L (ref 39–117)
Bilirubin, Direct: 0.1 mg/dL (ref 0.0–0.3)
Total Protein: 7.2 g/dL (ref 6.0–8.3)

## 2011-11-02 LAB — LIPID PANEL: Total CHOL/HDL Ratio: 3

## 2011-11-12 ENCOUNTER — Telehealth: Payer: Self-pay

## 2011-11-12 MED ORDER — GLIMEPIRIDE 2 MG PO TABS: 2.0000 mg | ORAL_TABLET | Freq: Every day | ORAL | Status: DC

## 2011-11-12 NOTE — Telephone Encounter (Signed)
RX mailed to patient as per MD Request

## 2011-11-12 NOTE — Telephone Encounter (Signed)
Message copied by Maurice Small on Thu Nov 12, 2011  9:25 AM ------      Message from: Pecola Lawless      Created: Sun Nov 08, 2011  8:16 AM       Please send new  Rx for Glimiperide 2 mg  1 qd with b'fast #90

## 2012-01-13 ENCOUNTER — Ambulatory Visit: Payer: BC Managed Care – PPO | Admitting: Cardiology

## 2012-01-15 ENCOUNTER — Encounter: Payer: Self-pay | Admitting: Cardiology

## 2012-01-15 DIAGNOSIS — I779 Disorder of arteries and arterioles, unspecified: Secondary | ICD-10-CM | POA: Insufficient documentation

## 2012-01-15 DIAGNOSIS — I739 Peripheral vascular disease, unspecified: Secondary | ICD-10-CM

## 2012-01-18 ENCOUNTER — Ambulatory Visit (INDEPENDENT_AMBULATORY_CARE_PROVIDER_SITE_OTHER): Payer: BC Managed Care – PPO | Admitting: Cardiology

## 2012-01-18 ENCOUNTER — Encounter: Payer: Self-pay | Admitting: Cardiology

## 2012-01-18 VITALS — BP 130/70 | HR 76 | Resp 18 | Ht 69.0 in | Wt 210.4 lb

## 2012-01-18 DIAGNOSIS — I10 Essential (primary) hypertension: Secondary | ICD-10-CM

## 2012-01-18 DIAGNOSIS — E119 Type 2 diabetes mellitus without complications: Secondary | ICD-10-CM

## 2012-01-18 DIAGNOSIS — I2510 Atherosclerotic heart disease of native coronary artery without angina pectoris: Secondary | ICD-10-CM

## 2012-01-18 DIAGNOSIS — R0989 Other specified symptoms and signs involving the circulatory and respiratory systems: Secondary | ICD-10-CM

## 2012-01-18 DIAGNOSIS — R42 Dizziness and giddiness: Secondary | ICD-10-CM | POA: Insufficient documentation

## 2012-01-18 NOTE — Assessment & Plan Note (Signed)
Blood pressure is controlled. No change in therapy. 

## 2012-01-18 NOTE — Assessment & Plan Note (Signed)
The patient is working hard on fine tuning his diabetes.

## 2012-01-18 NOTE — Assessment & Plan Note (Signed)
Coronary disease is stable post CABG. He is exercising well. No change in therapy.

## 2012-01-18 NOTE — Patient Instructions (Addendum)
Your physician has recommended you make the following change in your medication: Cut your metoprolol in half for two weeks and then discontinue taking it.  Your physician recommends that you schedule a follow-up appointment in: 5-6 weeks.

## 2012-01-18 NOTE — Assessment & Plan Note (Signed)
The etiology of the patient's dizziness is not clear. It was self-limited. However overall we think that the beta blocker may be affecting him. I've encouraged him to decrease his metoprolol to 25 twice a day for 2 weeks and then stop it. Called and see him back in 6 weeks.

## 2012-01-18 NOTE — Progress Notes (Signed)
HPI Patient is seen in followup coronary disease. He underwent CABG September, 2012 .  He's actually doing well. He is exercising regularly. He would like to lose more weight. He had a period of time with dizziness for about one week several weeks ago. This is now resolved. He's wondering if the metoprolol causes him some problems.  Allergies  Allergen Reactions  . Rosuvastatin     REACTION: myalgias    Current Outpatient Prescriptions  Medication Sig Dispense Refill  . aspirin 81 MG tablet Take 81 mg by mouth daily.        . fish oil-omega-3 fatty acids 1000 MG capsule Take 500 g by mouth daily.       [name] HYDROcodone-acetaminophen (NORCO) 5-325 MG per tablet 1 tablet every 6 (six) hours as needed.       . metoprolol (LOPRESSOR) 50 MG tablet Take 1 tablet (50 mg total) by mouth 2 (two) times daily.  60 tablet  6  . Saxagliptin-Metformin 2.12-998 MG TB24 Take 2.5-1,000 mg by mouth 2 (two) times daily with a meal.  60 tablet  5  . simvastatin (ZOCOR) 20 MG tablet Take 1 tablet (20 mg total) by mouth at bedtime.  90 tablet  0  . glimepiride (AMARYL) 2 MG tablet Take 1 tablet (2 mg total) by mouth daily before breakfast.  90 tablet  0  . DISCONTD: simvastatin (ZOCOR) 40 MG tablet Take 1 tablet (40 mg total) by mouth every evening.  30 tablet  11    History   Social History  . Marital Status: Single    Spouse Name: N/A    Number of Children: N/A  . Years of Education: N/A   Occupational History  . Emergency planning/management officer     Social History Main Topics  . Smoking status: Never Smoker   . Smokeless tobacco: Not on file  . Alcohol Use: No  . Drug Use: No  . Sexually Active: Not on file   Other Topics Concern  . Not on file   Social History Narrative  . No narrative on file    Family History  Problem Relation Age of Onset  . Hypertension Father   . Heart attack Father     bypass  . Heart disease Father   . Coronary artery disease Father   . Heart attack Paternal Grandfather   .  Cancer Maternal Grandfather     site unknown  . Diabetes Paternal Aunt   . Heart failure Maternal Grandmother     Past Medical History  Diagnosis Date  . Food poisoning     age 43  . Rash   . Hypertension   . Hyperlipidemia   . Diabetes mellitus   . 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 and coursed between the pulmonary artery and aorta anterior to the aorta. EF 55% cath-01/24/03 60% echo June 2009  . Aortic valve sclerosis     echo june 2009  . PVC (premature ventricular contraction) 06/2009    November, 2010  . Carotid bruit 06/2009    Doppler, November, 2010, 0-39% bilateral  . GERD (gastroesophageal reflux disease)   . Muscle ache     on crestor  09/04/09 CPK 193(7-232) tolerated simvastin in the past  . Ejection fraction     Ejection fraction 60%, echo, June, 2009  . Hx of CABG     [name],  x4  May 01, 2011 limited to the LAD, left radial artery to the OM, SVG to diagonal, SVG to posterior descending  . Carotid artery disease     Doppler, preop, September, 2012,  mild bilateral plaque with no  significant stenoses    Past Surgical History  Procedure Date  . Coccyx bone removed   . Pilonidal cystectomy   . Back surgery   . Intraoperative transesophageal echocardiography. 05/01/2011  . Coronary artery bypass graft 05/01/2011    CABG x4 with LIMA to LAD, LRA to OM, SVG to D1, SVG to PDA, EVH via left thigh    ROS  Patient denies fever, chills, headache, sweats, rash, change in vision, change in hearing, chest pain, cough, nausea vomiting, urinary symptoms. All other systems are reviewed and are negative.  PHYSICAL EXAM  Patient is oriented to person time and place. Affect is normal. There is no jugulovenous distention. Lungs are clear. Respiratory effort is nonlabored. Cardiac exam reveals S1 and S2. There no clicks or significant  murmurs. The abdomen is soft. There is no peripheral edema. All of his surgical sites are nicely healed. There no musculoskeletal deformities. There are no skin rashes.  Filed Vitals:   01/18/12 1541  BP: 130/70  Pulse: 76  Resp: 18  Height: 5\' 9"  (1.753 m)  Weight: 210 lb 6.4 oz (95.437 kg)     ASSESSMENT & PLAN

## 2012-01-18 NOTE — Assessment & Plan Note (Signed)
The patient's carotids were stable as of September, 2012, and no further workup.

## 2012-01-26 ENCOUNTER — Telehealth: Payer: Self-pay | Admitting: Internal Medicine

## 2012-01-26 NOTE — Telephone Encounter (Signed)
Pt is scheduled for labs 02/01/12 and will make follow-up appointment with you for a few days later while he is here.

## 2012-01-26 NOTE — Telephone Encounter (Signed)
Message copied by Marshell Garfinkel on Tue Jan 26, 2012  8:59 AM ------      Message from: Pecola Lawless      Created: Sun Jan 24, 2012  2:07 PM       Please schedule A1c & urine microalbumin in July with appt 2-3 days later. A1c was 8 % on 11-15-22. Codes: 250.02

## 2012-02-01 ENCOUNTER — Other Ambulatory Visit (INDEPENDENT_AMBULATORY_CARE_PROVIDER_SITE_OTHER): Payer: BC Managed Care – PPO

## 2012-02-01 DIAGNOSIS — IMO0001 Reserved for inherently not codable concepts without codable children: Secondary | ICD-10-CM

## 2012-02-01 LAB — MICROALBUMIN / CREATININE URINE RATIO
Creatinine,U: 191.2 mg/dL
Microalb Creat Ratio: 0.8 mg/g (ref 0.0–30.0)
Microalb, Ur: 1.6 mg/dL (ref 0.0–1.9)

## 2012-02-08 ENCOUNTER — Encounter: Payer: Self-pay | Admitting: Internal Medicine

## 2012-02-08 ENCOUNTER — Ambulatory Visit (INDEPENDENT_AMBULATORY_CARE_PROVIDER_SITE_OTHER): Payer: BC Managed Care – PPO | Admitting: Internal Medicine

## 2012-02-08 VITALS — BP 140/90 | HR 84 | Wt 210.0 lb

## 2012-02-08 DIAGNOSIS — I10 Essential (primary) hypertension: Secondary | ICD-10-CM

## 2012-02-08 DIAGNOSIS — E785 Hyperlipidemia, unspecified: Secondary | ICD-10-CM

## 2012-02-08 DIAGNOSIS — E119 Type 2 diabetes mellitus without complications: Secondary | ICD-10-CM

## 2012-02-08 MED ORDER — SAXAGLIPTIN-METFORMIN ER 2.5-1000 MG PO TB24: 2.5000 mg | ORAL_TABLET | Freq: Two times a day (BID) | ORAL | Status: DC

## 2012-02-08 NOTE — Assessment & Plan Note (Signed)
Lipids were at goal 11/02/11. I recommend these be repeated with an A1c in 6 months. With his therapeutic life changes of exercise and heart healthy diet; his statin dose may be able to be decreased at that time

## 2012-02-08 NOTE — Assessment & Plan Note (Signed)
Diabetes control has improved dramatically. No change indicated. Goals discussed. Recheck the A1c in 6 months.

## 2012-02-08 NOTE — Patient Instructions (Addendum)
Blood Pressure Goal  Ideally is an AVERAGE < 135/85. This AVERAGE should be calculated from @ least 5-7 BP readings taken @ different times of day on different days of week. You should not respond to isolated BP readings , but rather the AVERAGE for that week . Please  schedule fasting Labs in 6 months : BMET,Lipids, hepatic panel, TSH, A1c. PLEASE BRING THESE INSTRUCTIONS TO FOLLOW UP  LAB APPOINTMENT.This will guarantee correct labs are drawn, eliminating need for repeat blood sampling ( needle sticks ! ). Diagnoses /Codes: 272.4, 995.20,250.00

## 2012-02-08 NOTE — Progress Notes (Signed)
  Subjective:    Patient ID: [name] [name], male    DOB: 1950/08/07, 61 y.o.   MRN: [id number]  HPI HYPERTENSION: Disease Monitoring: Blood pressure range-120-130s/70-80s  Chest pain, palpitations- no       Dyspnea- no Medications: Compliance- yes; Dr Myrtis Ser D/Ced  Beta blocker. BP 120/70 pre discontinuation Lightheadedness,Syncope- no   Edema- no  DIABETES: Disease Monitoring: A1c has dropped from 8% on 4/1 to 6.7%. 8% would be associated with an average sugar 183 the long-term risk of 60%. 6.7% means sugar average is 132 and long-term risk  34%. Blood Sugar ranges-140s  Polyuria/phagia/dipsia-no      Visual problems- no; appt 7/24 with Dr Hazle Quant Medications: Compliance- yes  Hypoglycemic symptoms- no:  HYPERLIPIDEMIA: Lipids were at goal 11/02/11 Disease Monitoring: See symptoms for Hypertension Medications: Compliance- yes  Abd pain, bowel changes- no  Muscle aches- no        Review of Systems He is on a heart healthy diet; he he has increased his exercise dramatically. He is now exercising at least 90 minutes 2-3 times a week or more.     Objective:   Physical Exam Gen.: Healthy and well-nourished in appearance. Alert, appropriate and cooperative throughout exam.  Eyes: No corneal or conjunctival inflammation noted.  Mouth: Oral mucosa and oropharynx reveal no lesions or exudates. Teeth in good repair. Neck: No deformities, masses, or tenderness noted.  Thyroid normal. Lungs: Normal respiratory effort; chest expands symmetrically. Lungs are clear to auscultation without rales, wheezes, or increased work of breathing. Heart: Normal rate and rhythm. Normal S1 and S2. No gallop, click, or rub. Grade 1/6 systolic murmur  Abdomen: Bowel sounds normal; abdomen soft and nontender. No masses, organomegaly or hernias noted.                                                                                Musculoskeletal/extremities:  No clubbing, cyanosis, edema noted. Joints :  mild DIP changes. Nail health  good. Vascular: Carotid, radial artery, dorsalis pedis and  posterior tibial pulses are full and equal. No bruits present. Neurologic: Alert and oriented x3. Deep tendon reflexes symmetrical and normal. Light touch normal over feet          Skin: Intact without suspicious lesions or rashes. Lymph: No cervical, axillary lymphadenopathy present. Psych: Mood and affect are normal. Normally interactive                                                                                         Assessment & Plan:

## 2012-02-08 NOTE — Assessment & Plan Note (Signed)
His blood pressure may have risen since the beta blocker was discontinued. He has a followup appointment with his cardiologist in a week. I've asked him to keep measurements of the blood pressure and take that diary and the cuff to the office visit. Low-dose beta blocker may be necessary based on those records

## 2012-02-17 ENCOUNTER — Ambulatory Visit (INDEPENDENT_AMBULATORY_CARE_PROVIDER_SITE_OTHER): Payer: BC Managed Care – PPO | Admitting: Cardiology

## 2012-02-17 ENCOUNTER — Encounter: Payer: Self-pay | Admitting: Cardiology

## 2012-02-17 VITALS — BP 164/88 | HR 80 | Ht 69.0 in | Wt 209.0 lb

## 2012-02-17 DIAGNOSIS — R42 Dizziness and giddiness: Secondary | ICD-10-CM

## 2012-02-17 DIAGNOSIS — I10 Essential (primary) hypertension: Secondary | ICD-10-CM

## 2012-02-17 MED ORDER — RAMIPRIL 2.5 MG PO CAPS: 2.5000 mg | ORAL_CAPSULE | Freq: Every day | ORAL | Status: DC

## 2012-02-17 NOTE — Progress Notes (Signed)
HPI   The patient is seen in followup coronary disease. When I saw him last on January 18, 2012 he had some dizziness. I decided to stop his beta blocker. He feels much better. He is fully active. He has some mild tenderness to his anterior chest from the surgical site.  Allergies  Allergen Reactions  . Rosuvastatin     REACTION: myalgias    Current Outpatient Prescriptions  Medication Sig Dispense Refill  . aspirin 81 MG tablet Take 81 mg by mouth daily.        . fish oil-omega-3 fatty acids 1000 MG capsule Take 500 g by mouth daily.       [name] HYDROcodone-acetaminophen (NORCO) 5-325 MG per tablet 1 tablet every 6 (six) hours as needed.       . Saxagliptin-Metformin 2.12-998 MG TB24 Take 2.5-1,000 mg by mouth 2 (two) times daily with a meal.  60 tablet  5  . simvastatin (ZOCOR) 20 MG tablet Take 1 tablet (20 mg total) by mouth at bedtime.  90 tablet  0  . DISCONTD: simvastatin (ZOCOR) 40 MG tablet Take 1 tablet (40 mg total) by mouth every evening.  30 tablet  11    History   Social History  . Marital Status: Single    Spouse Name: N/A    Number of Children: N/A  . Years of Education: N/A   Occupational History  . Emergency planning/management officer     Social History Main Topics  . Smoking status: Never Smoker   . Smokeless tobacco: Not on file  . Alcohol Use: No  . Drug Use: No  . Sexually Active: Not on file   Other Topics Concern  . Not on file   Social History Narrative  . No narrative on file    Family History  Problem Relation Age of Onset  . Hypertension Father   . Heart attack Father     bypass  . Heart disease Father   . Coronary artery disease Father   . Heart attack Paternal Grandfather   . Cancer Maternal Grandfather     site unknown  . Diabetes Paternal Aunt   . Heart failure Maternal Grandmother     Past Medical History  Diagnosis Date  . Food poisoning     age 25  . Rash   . Hypertension   . Hyperlipidemia   . Diabetes mellitus   . 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 and coursed between the pulmonary artery and aorta anterior to the aorta. EF 55% cath-01/24/03 60% echo June 2009  . Aortic valve sclerosis     echo june 2009  . PVC (premature ventricular contraction) 06/2009    November, 2010  . Carotid bruit 06/2009    Doppler, November, 2010, 0-39% bilateral  . GERD (gastroesophageal reflux disease)   . Muscle ache     on crestor  09/04/09 CPK 193(7-232) tolerated simvastin in the past  . Ejection fraction     Ejection fraction 60%, echo, June, 2009  . Hx of CABG     [name],  x4  May 01, 2011 limited to the LAD, left radial artery to the OM, SVG to diagonal, SVG to posterior descending  . Carotid artery disease     Doppler, preop, September, 2012,  mild bilateral plaque with no  significant stenoses  . Dizziness     June, 2013  Past Surgical History  Procedure Date  . Coccyx bone removed   . Pilonidal cystectomy   . Back surgery   . Intraoperative transesophageal echocardiography. 05/01/2011  . Coronary artery bypass graft 05/01/2011    CABG x4 with LIMA to LAD, LRA to OM, SVG to D1, SVG to PDA, EVH via left thigh    ROS  Patient denies fever, chills, headache, sweats, rash, change in vision, change in hearing,  Chest pain, cough, nausea vomiting, urinary symptoms. He has some mild tenderness to the left anterior chest that is a postoperative sensation. All other systems are reviewed and are negative.  PHYSICAL EXAM Patient is oriented to person time and place. Affect is normal. There is no jugular venous distention. Lungs are clear. Respiratory effort is nonlabored. Cardiac exam reveals S1 and S2. There no clicks or significant murmurs. The abdomen is soft. There is no peripheral edema. There is no significant abnormality in the anterior chest.  Filed Vitals:   02/17/12 1633  BP:  164/88  Pulse: 80  Height: 5\' 9"  (1.753 m)  Weight: 209 lb (94.802 kg)  SpO2: 98%     ASSESSMENT & PLAN

## 2012-02-17 NOTE — Patient Instructions (Addendum)
Your physician has recommended you make the following change in your medication: START Ramipril (Altace) 2.5mg  daily  Your physician recommends that you schedule a follow-up appointment in: 3 months.

## 2012-02-17 NOTE — Assessment & Plan Note (Signed)
Patient has no further dizziness cough the low dose beta blocker. No further workup.

## 2012-02-17 NOTE — Assessment & Plan Note (Signed)
The patient's blood pressure has been elevated on several occasions. It has been intermittent. He says he has been elevated when checked at other places recently. Today the systolic is 160. He has diabetes. I've chosen to start Altace 2.5 mg daily.

## 2012-04-21 ENCOUNTER — Telehealth: Payer: Self-pay | Admitting: Cardiology

## 2012-04-21 NOTE — Telephone Encounter (Signed)
Pt started taking verapamil 2.5mg  and was told by Dr. Myrtis Ser if he needed he could increase to 5mg  so he did and he needs a new Rx called in for the 5mg  [facility] [place] on [address] pls call patient to let him know when he can pick it up

## 2012-04-22 NOTE — Telephone Encounter (Signed)
N/A.  LMTC. 

## 2012-04-25 ENCOUNTER — Other Ambulatory Visit: Payer: Self-pay

## 2012-04-25 MED ORDER — RAMIPRIL 5 MG PO CAPS: 5.0000 mg | ORAL_CAPSULE | Freq: Every day | ORAL | Status: DC

## 2012-04-25 NOTE — Telephone Encounter (Signed)
Ramipril was changed to 5mg  per Dr Myrtis Ser.  Rx was sent to [facility].  Pt was notified.

## 2012-05-02 ENCOUNTER — Telehealth: Payer: Self-pay | Admitting: Internal Medicine

## 2012-05-02 NOTE — Telephone Encounter (Signed)
Caller: Dean/Patient; Patient Name: [name] [name]; PCP: Marga Melnick; Best Callback Phone Number: [phone]  05-01-12  onset of sorethroat with no other symptoms,  voice normal, no fever.  All emergent symptoms per Sore Throat protocols ruled out  Home care advice given.

## 2012-05-02 NOTE — Telephone Encounter (Signed)
Please advise 

## 2012-05-02 NOTE — Telephone Encounter (Signed)
Spoke with patient, patient states he will follow instructions given by triage nurse and will call if he feels the need to be seen by MD

## 2012-05-16 ENCOUNTER — Encounter: Payer: Self-pay | Admitting: Cardiology

## 2012-05-16 ENCOUNTER — Ambulatory Visit (INDEPENDENT_AMBULATORY_CARE_PROVIDER_SITE_OTHER): Payer: BC Managed Care – PPO | Admitting: Cardiology

## 2012-05-16 VITALS — BP 146/80 | HR 75 | Ht 69.0 in | Wt 206.8 lb

## 2012-05-16 DIAGNOSIS — R42 Dizziness and giddiness: Secondary | ICD-10-CM

## 2012-05-16 DIAGNOSIS — I2510 Atherosclerotic heart disease of native coronary artery without angina pectoris: Secondary | ICD-10-CM

## 2012-05-16 DIAGNOSIS — I10 Essential (primary) hypertension: Secondary | ICD-10-CM

## 2012-05-16 NOTE — Patient Instructions (Addendum)
Your physician wants you to follow-up in:  6 months. You will receive a reminder letter in the mail two months in advance. If you don't receive a letter, please call our office to schedule the follow-up appointment.   

## 2012-05-16 NOTE — Progress Notes (Signed)
HPI  Patient is seen today to followup coronary disease and hypertension. He's doing well. He continues to be motivated. He is with a woman now who is quite active and exercise together. He's feeling well. He's not having any chest pain or shortness of breath. His blood pressure measurements at home tend to run in the 130/80 range. This is good for him. He's tolerating the Ramapril 5 mg daily that we added.  Allergies  Allergen Reactions  . Rosuvastatin     REACTION: myalgias    Current Outpatient Prescriptions  Medication Sig Dispense Refill  . aspirin 81 MG tablet Take 81 mg by mouth daily.        . fish oil-omega-3 fatty acids 1000 MG capsule Take 500 g by mouth daily.       [name] HYDROcodone-acetaminophen (NORCO) 5-325 MG per tablet 1 tablet every 6 (six) hours as needed.       . ramipril (ALTACE) 5 MG capsule Take 1 capsule (5 mg total) by mouth daily.  90 capsule  3  . Saxagliptin-Metformin 2.12-998 MG TB24 Take 2.5-1,000 mg by mouth 2 (two) times daily with a meal.  60 tablet  5  . simvastatin (ZOCOR) 20 MG tablet Take 1 tablet (20 mg total) by mouth at bedtime.  90 tablet  0    History   Social History  . Marital Status: Single    Spouse Name: N/A    Number of Children: N/A  . Years of Education: N/A   Occupational History  . Emergency planning/management officer     Social History Main Topics  . Smoking status: Never Smoker   . Smokeless tobacco: Not on file  . Alcohol Use: No  . Drug Use: No  . Sexually Active: Not on file   Other Topics Concern  . Not on file   Social History Narrative  . No narrative on file    Family History  Problem Relation Age of Onset  . Hypertension Father   . Heart attack Father     bypass  . Heart disease Father   . Coronary artery disease Father   . Heart attack Paternal Grandfather   . Cancer Maternal Grandfather     site unknown  . Diabetes Paternal Aunt   . Heart failure Maternal Grandmother     Past Medical History  Diagnosis Date  . Food  poisoning     age 32  . Rash   . Hypertension   . Hyperlipidemia   . Diabetes mellitus   . 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 and coursed between the pulmonary artery and aorta anterior to the aorta. EF 55% cath-01/24/03 60% echo June 2009  . Aortic valve sclerosis     echo june 2009  . PVC (premature ventricular contraction) 06/2009    November, 2010  . Carotid bruit 06/2009    Doppler, November, 2010, 0-39% bilateral  . GERD (gastroesophageal reflux disease)   . Muscle ache     on crestor  09/04/09 CPK 193(7-232) tolerated simvastin in the past  . Ejection fraction     Ejection fraction 60%, echo, June, 2009  . Hx of CABG     [name],  x4  May 01, 2011 limited to the LAD, left radial artery to the OM, SVG to diagonal, SVG to posterior descending  . Carotid artery disease     Doppler, preop, September,  2012,  mild bilateral plaque with no  significant stenoses  . Dizziness     June, 2013    Past Surgical History  Procedure Date  . Coccyx bone removed   . Pilonidal cystectomy   . Back surgery   . Intraoperative transesophageal echocardiography. 05/01/2011  . Coronary artery bypass graft 05/01/2011    CABG x4 with LIMA to LAD, LRA to OM, SVG to D1, SVG to PDA, EVH via left thigh    Patient Active Problem List  Diagnosis  . DIABETES MELLITUS, TYPE II  . HYPERTENSION  . KNEE PAIN, LEFT, CHRONIC  . CAD (coronary artery disease)  . Hyperlipidemia  . Aortic valve sclerosis  . PVC (premature ventricular contraction)  . Carotid bruit  . GERD (gastroesophageal reflux disease)  . Muscle ache  . Ejection fraction  . Hx of CABG  . Carotid artery disease  . Dizziness  . Hypertension    ROS   Patient denies fever, chills, headache, sweats, rash, change in vision, change in hearing, chest pain, cough, nausea vomiting, urinary symptoms.  All other systems are reviewed and are negative.  PHYSICAL EXAM   He is in good spirits today. He is oriented to person time and place. Affect is normal. He's lost an additional 3 pounds. Lungs are clear. Respiratory effort is nonlabored. Cardiac exam reveals S1 and S2. There no clicks or significant murmurs. The abdomen is soft. Is no peripheral edema.  Filed Vitals:   05/16/12 1007  BP: 146/80  Pulse: 75  Height: 5\' 9"  (1.753 m)  Weight: 206 lb 12.8 oz (93.804 kg)    ASSESSMENT & PLAN

## 2012-05-16 NOTE — Assessment & Plan Note (Signed)
Blood pressure is now under good control. No change in therapy. 

## 2012-05-16 NOTE — Assessment & Plan Note (Signed)
Coronary disease is stable. He is doing well post CABG.

## 2012-05-16 NOTE — Assessment & Plan Note (Signed)
He's not having any dizziness. This stopped and we took him off the low dose of the beta blocker.

## 2012-09-14 ENCOUNTER — Other Ambulatory Visit: Payer: Self-pay | Admitting: Internal Medicine

## 2012-09-14 NOTE — Telephone Encounter (Signed)
A1c 250.00 

## 2012-09-20 ENCOUNTER — Encounter: Payer: Self-pay | Admitting: Lab

## 2012-09-21 ENCOUNTER — Encounter: Payer: Self-pay | Admitting: Internal Medicine

## 2012-09-21 ENCOUNTER — Ambulatory Visit (INDEPENDENT_AMBULATORY_CARE_PROVIDER_SITE_OTHER): Payer: BC Managed Care – PPO | Admitting: Internal Medicine

## 2012-09-21 VITALS — BP 148/88 | HR 73 | Wt 212.0 lb

## 2012-09-21 DIAGNOSIS — E119 Type 2 diabetes mellitus without complications: Secondary | ICD-10-CM

## 2012-09-21 DIAGNOSIS — I10 Essential (primary) hypertension: Secondary | ICD-10-CM

## 2012-09-21 DIAGNOSIS — E785 Hyperlipidemia, unspecified: Secondary | ICD-10-CM

## 2012-09-21 DIAGNOSIS — I779 Disorder of arteries and arterioles, unspecified: Secondary | ICD-10-CM

## 2012-09-21 DIAGNOSIS — Z1211 Encounter for screening for malignant neoplasm of colon: Secondary | ICD-10-CM

## 2012-09-21 LAB — HEPATIC FUNCTION PANEL
ALT: 29 U/L (ref 0–53)
AST: 20 U/L (ref 0–37)
Albumin: 4.4 g/dL (ref 3.5–5.2)
Alkaline Phosphatase: 40 U/L (ref 39–117)
Bilirubin, Direct: 0.1 mg/dL (ref 0.0–0.3)
Total Bilirubin: 0.6 mg/dL (ref 0.3–1.2)

## 2012-09-21 LAB — BASIC METABOLIC PANEL
CO2: 26 mEq/L (ref 19–32)
Calcium: 9.1 mg/dL (ref 8.4–10.5)
GFR: 126.33 mL/min (ref >60.00)
Glucose, Bld: 270 mg/dL — ABNORMAL HIGH (ref 70–99)
Potassium: 3.9 mEq/L (ref 3.5–5.1)
Sodium: 138 mEq/L (ref 135–145)

## 2012-09-21 LAB — MICROALBUMIN / CREATININE URINE RATIO
Creatinine,U: 109.8 mg/dL
Microalb Creat Ratio: 0.8 mg/g (ref 0.0–30.0)

## 2012-09-21 LAB — LIPID PANEL
Cholesterol: 131 mg/dL (ref 0–200)
VLDL: 49.6 mg/dL — ABNORMAL HIGH (ref 0.0–40.0)

## 2012-09-21 NOTE — Assessment & Plan Note (Signed)
No carotid bruits auscultated. Lipids will be checked

## 2012-09-21 NOTE — Patient Instructions (Addendum)
As per the Standard of Care , screening Colonoscopy recommended @ 50 & every 5-10 years thereafter . More frequent monitor would be dictated by family history or findings @ Colonoscopy. Caries ( cavities) and plaque on the teeth can lead to significant local and systemic infections with threat to your health.Please have dental care completed as soon as possible. Plain Mucinex (NOT D) for thick secretions ;force NON dairy fluids .   Nasal cleansing in the shower as discussed with lather of mild shampoo.After 10 seconds wash off lather while  exhaling through nostrils. Make sure that all residual soap is removed to prevent irritation.  Plain Allegra (NOT D )  160 daily , Loratidine 10 mg , OR Zyrtec 10 mg @ bedtime  as needed for itchy eyes & sneezing.     If you activate the  My Chart system; lab & Xray results will be released directly  to you as soon as I review & address these through the computer. As per [place] all records must be reviewed and signed off within 72 hours; but I attempt to complete this within 36 hours unless I have no access to the electronic medical record.If I wait more than 36 hours the volume of reports becomes difficult to manage optimally. In my absence ;my partners would address the results.Critical lab results will be communicated to you ASAP. If you choose not to sign up for My Chart within 36 hours of labs being drawn; results will be reviewed & interpretation added before being copied & mailed, causing a delay in getting the results to you.If you do not receive that report within 7-10 days ,please call. Additionally you can use this system to gain direct  access to your records  if  out of town or @ an office of a  physician who is not in  the My Chart network.  This improves continuity of care & places you in control of your medical record.

## 2012-09-21 NOTE — Assessment & Plan Note (Signed)
No evidence of neuropathy; no significant skin lesions. A1c and urine microalbumin will be checked

## 2012-09-21 NOTE — Assessment & Plan Note (Signed)
Fasting lipids and hepatic panel will be checked along with TSH

## 2012-09-21 NOTE — Assessment & Plan Note (Signed)
BMET will be checked. Blood pressure goals discussed

## 2012-09-21 NOTE — Progress Notes (Signed)
  Subjective:    Patient ID: [name] [name], male    DOB: May 20, 1951, 62 y.o.   MRN: [id number]  HPI HYPERTENSION: Disease Monitoring:see 10/14 Cardiology OV Blood pressure range 120-150/80s Compliant with present antihypertensive regimen   DIABETES : Disease Monitoring: Blood Sugar: average in 140s Medication Compliance: yes Diet: low salt & carb Exercise :   3- 5X / week as walking 30-90 min until 1 month ago decreased to 1X/ week Ophth exam current Podiatry exam not current  Hypoglycemic symptoms:no  HYPERLIPIDEMIA: Diet & exercise as noted Medication Compliance:  Statin taken No FH premature CAD / CVA   Past medical history/family history/social history were all reviewed and updated.                   Review of Systems No significant headaches No chest pain, palpitations , claudications, PNDyspnea    No exertional dyspnea No lightheadedness or syncope   No edema No polyuria/phagia/dipsia No limb numbness & tingling or weakness No non healing skin lesions      No blurred , double or loss of vision No abd pain, bowel changes. No colonoscopy to date; standard of care & risk  discussed . No significant myalgias     Objective:   Physical Exam Gen.: Healthy and well-nourished in appearance. Alert, appropriate and cooperative throughout exam. Appears younger than stated age  Head: Normocephalic without obvious abnormalities.  Head shaven ; goatee Eyes: No corneal or conjunctival inflammation noted.  Mouth: Oral mucosa and oropharynx reveal no lesions or exudates. Dental care needed ; erosion R mandibular molar. Neck: No deformities, masses, or tenderness noted.  Thyroid normal. Lungs: Normal respiratory effort; chest expands symmetrically. Lungs are clear to auscultation without rales, wheezes, or increased work of breathing. Heart: Normal rate and rhythm. Normal S1 and S2. No gallop, click, or rub .S4 with slurring at  LSB Abdomen: Bowel sounds normal;  abdomen soft and nontender. No masses, organomegaly or hernias noted.                                  Musculoskeletal/extremities: No clubbing, cyanosis, edema, or significant extremity  deformity noted. Tone & strength  normal.Joints normal . Nail health good. Able to lie down & sit up w/o help.  Vascular: Carotid, radial artery, dorsalis pedis and  posterior tibial pulses are full and equal. No bruits present. Neurologic: Alert and oriented x3. Deep tendon reflexes symmetrical and normal. Light touch normal over feet.  Skin: Intact without suspicious lesions or rashes. Lymph: No cervical, axillary lymphadenopathy present. Psych: Mood and affect are normal. Normally interactive                                                                                       Assessment & Plan:

## 2012-09-22 ENCOUNTER — Encounter: Payer: Self-pay | Admitting: Gastroenterology

## 2012-09-22 ENCOUNTER — Telehealth: Payer: Self-pay

## 2012-09-22 NOTE — Telephone Encounter (Signed)
Spoke with patient, patient to schedule an appointment with Dr.Hopper to thoroughly discuss changing from oral agents to insulin. Patient will check his schedule and call back to schedule appointment

## 2012-09-22 NOTE — Telephone Encounter (Signed)
Left message on VM for patient to return call when available  

## 2012-09-22 NOTE — Telephone Encounter (Signed)
Message copied by Maurice Small on Thu Sep 22, 2012  8:17 AM ------      Message from: Pecola Lawless      Created: Wed Sep 21, 2012  6:47 PM       A1c assesses average 24 hour  glucose over prior 6-12 weeks. A1c GOALS:      No Diabetes risk if < 6.1%       "Pre Diabetes" :6.2-6.4 %      Good diabetic control: 6.5-7 %      Fair diabetic control: 7-8 %      Poor diabetic control: greater than 8 % ( except with additional factors such as  advanced age; significant coronary or neurologic disease,etc).        A1c of 8.4% equals average sugar of 195 & long term risk of 68% increase for heart attack or stroke !      Goals for home glucose monitoring are : fasting  or morning glucose goal of  100-150. Two hours after any meal , goal = < 180, preferably < 160.      Report any low blood glucoses immediately.      Please make Nurse appt for insulin teaching ; oral medications are ineffective.                      ------

## 2012-10-12 ENCOUNTER — Ambulatory Visit (AMBULATORY_SURGERY_CENTER): Payer: BC Managed Care – PPO

## 2012-10-12 VITALS — Ht 69.0 in | Wt 211.0 lb

## 2012-10-12 DIAGNOSIS — Z1211 Encounter for screening for malignant neoplasm of colon: Secondary | ICD-10-CM

## 2012-10-12 MED ORDER — MOVIPREP 100 G PO SOLR: 1.0000 | Freq: Once | ORAL | Status: DC

## 2012-10-13 ENCOUNTER — Encounter: Payer: Self-pay | Admitting: Gastroenterology

## 2012-10-24 ENCOUNTER — Other Ambulatory Visit: Payer: Self-pay | Admitting: Internal Medicine

## 2012-10-26 ENCOUNTER — Other Ambulatory Visit: Payer: Self-pay | Admitting: Gastroenterology

## 2012-10-26 ENCOUNTER — Other Ambulatory Visit: Payer: Self-pay

## 2012-10-26 ENCOUNTER — Encounter: Payer: Self-pay | Admitting: Gastroenterology

## 2012-10-26 ENCOUNTER — Ambulatory Visit (AMBULATORY_SURGERY_CENTER): Payer: BC Managed Care – PPO | Admitting: Gastroenterology

## 2012-10-26 VITALS — BP 136/82 | HR 63 | Temp 97.1°F | Resp 27 | Ht 69.0 in | Wt 211.0 lb

## 2012-10-26 DIAGNOSIS — Z1211 Encounter for screening for malignant neoplasm of colon: Secondary | ICD-10-CM

## 2012-10-26 DIAGNOSIS — K5730 Diverticulosis of large intestine without perforation or abscess without bleeding: Secondary | ICD-10-CM

## 2012-10-26 DIAGNOSIS — Z86010 Personal history of colonic polyps: Secondary | ICD-10-CM

## 2012-10-26 LAB — GLUCOSE, CAPILLARY: Glucose-Capillary: 205 mg/dL — ABNORMAL HIGH (ref 70–99)

## 2012-10-26 MED ORDER — SODIUM CHLORIDE 0.9 % IV SOLN: 500.0000 mL | INTRAVENOUS | Status: DC

## 2012-10-26 NOTE — Patient Instructions (Addendum)
YOU HAD AN ENDOSCOPIC PROCEDURE TODAY AT THE [facility]: Refer to the procedure report that was given to you for any specific questions about what was found during the examination.  If the procedure report does not answer your questions, please call your gastroenterologist to clarify.  If you requested that your care partner not be given the details of your procedure findings, then the procedure report has been included in a sealed envelope for you to review at your convenience later.  YOU SHOULD EXPECT: Some feelings of bloating in the abdomen. Passage of more gas than usual.  Walking can help get rid of the air that was put into your GI tract during the procedure and reduce the bloating. If you had a lower endoscopy (such as a colonoscopy or flexible sigmoidoscopy) you may notice spotting of blood in your stool or on the toilet paper. If you underwent a bowel prep for your procedure, then you may not have a normal bowel movement for a few days.  DIET: Your first meal following the procedure should be a light meal and then it is ok to progress to your normal diet.  A half-sandwich or bowl of soup is an example of a good first meal.  Heavy or fried foods are harder to digest and may make you feel nauseous or bloated.  Likewise meals heavy in dairy and vegetables can cause extra gas to form and this can also increase the bloating.  Drink plenty of fluids but you should avoid alcoholic beverages for 24 hours.  ACTIVITY: Your care partner should take you home directly after the procedure.  You should plan to take it easy, moving slowly for the rest of the day.  You can resume normal activity the day after the procedure however you should NOT DRIVE or use heavy machinery for 24 hours (because of the sedation medicines used during the test).    SYMPTOMS TO REPORT IMMEDIATELY: A gastroenterologist can be reached at any hour.  During normal business hours, 8:30 AM to 5:00 PM Monday through Friday,  call [phone].  After hours and on weekends, please call the GI answering service at [phone] who will take a message and have the physician on call contact you.   Following lower endoscopy (colonoscopy or flexible sigmoidoscopy):  Excessive amounts of blood in the stool  Significant tenderness or worsening of abdominal pains  Swelling of the abdomen that is new, acute  Fever of 100F or higher  Following upper endoscopy (EGD)  Vomiting of blood or coffee ground material  New chest pain or pain under the shoulder blades  Painful or persistently difficult swallowing  New shortness of breath  Fever of 100F or higher  Black, tarry-looking stools  FOLLOW UP: If any biopsies were taken you will be contacted by phone or by letter within the next 1-3 weeks.  Call your gastroenterologist if you have not heard about the biopsies in 3 weeks.  Our staff will call the home number listed on your records the next business day following your procedure to check on you and address any questions or concerns that you may have at that time regarding the information given to you following your procedure. This is a courtesy call and so if there is no answer at the home number and we have not heard from you through the emergency physician on call, we will assume that you have returned to your regular daily activities without incident.  SIGNATURES/CONFIDENTIALITY: You and/or your care   partner have signed paperwork which will be entered into your electronic medical record.  These signatures attest to the fact that that the information above on your After Visit Summary has been reviewed and is understood.  Full responsibility of the confidentiality of this discharge information lies with you and/or your care-partner.  

## 2012-10-26 NOTE — Progress Notes (Signed)
Lidocaine-40mg IV prior to Propofol InductionPropofol given over incremental dosages 

## 2012-10-26 NOTE — Progress Notes (Signed)
Patient did not experience any of the following events: a burn prior to discharge; a fall within the facility; wrong site/side/patient/procedure/implant event; or a hospital transfer or hospital admission upon discharge from the facility. (G8907) Patient did not have preoperative order for IV antibiotic SSI prophylaxis. (G8918)  

## 2012-10-26 NOTE — Op Note (Signed)
[facility] [name] [place], [address]   COLONOSCOPY PROCEDURE REPORT  PATIENT: [name] [name], [name] [name]  MR#: [id number] BIRTHDATE: September 02, 1950 , 61  yrs. old GENDER: Male ENDOSCOPIST: Mardella Layman, MD, [facility] REFERRED BY: PROCEDURE DATE:  10/26/2012 PROCEDURE:   Colonoscopy, screening ASA CLASS:   Class II INDICATIONS:Average risk patient for colon cancer and Patient's personal history of adenomatous colon polyps. MEDICATIONS: propofol (Diprivan) 150mg  IV  DESCRIPTION OF PROCEDURE:   After the risks and benefits and of the procedure were explained, informed consent was obtained.  A digital rectal exam revealed no abnormalities of the rectum.    The LB CF-[id number]  endoscope was introduced through the anus and advanced to the cecum, which was identified by both the appendix and ileocecal valve .  The quality of the prep was excellent, using MoviPrep .  The instrument was then slowly withdrawn as the colon was fully examined.     COLON FINDINGS: Mild diverticulosis was noted in the sigmoid colon. The colon was otherwise normal.  There was no diverticulosis, inflammation, polyps or cancers unless previously stated. Retroflexed views revealed no abnormalities.     The scope was then withdrawn from the patient and the procedure completed.  COMPLICATIONS: There were no complications. ENDOSCOPIC IMPRESSION: 1.   Mild diverticulosis was noted in the sigmoid colon 2.   The colon was otherwise normal ...no polyps noted,good prep  RECOMMENDATIONS: 1.  High fiber diet 2.  Repeat Colonoscopy in 5 years.   REPEAT EXAM:  cc:Pecola Lawless, MD  _______________________________ eSigned:  Mardella Layman, MD, [facility] 10/26/2012 9:52 AM

## 2012-10-27 ENCOUNTER — Telehealth: Payer: Self-pay | Admitting: *Deleted

## 2012-10-27 NOTE — Telephone Encounter (Signed)
  Follow up Call-  Call back number 10/26/2012  Post procedure Call Back phone  # [phone]  Permission to leave phone message Yes     Patient questions:  Do you have a fever, pain , or abdominal swelling? no Pain Score  0 *  Have you tolerated food without any problems? yes  Have you been able to return to your normal activities? yes  Do you have any questions about your discharge instructions: Diet   no Medications  no Follow up visit  no  Do you have questions or concerns about your Care? no  Actions: * If pain score is 4 or above: No action needed, pain <4.

## 2012-11-21 ENCOUNTER — Encounter: Payer: Self-pay | Admitting: Emergency Medicine

## 2012-11-21 ENCOUNTER — Other Ambulatory Visit: Payer: Self-pay | Admitting: Emergency Medicine

## 2012-11-21 MED ORDER — SIMVASTATIN 20 MG PO TABS: 20.0000 mg | ORAL_TABLET | Freq: Every day | ORAL | Status: DC

## 2013-01-02 ENCOUNTER — Encounter: Payer: Self-pay | Admitting: Cardiology

## 2013-01-02 ENCOUNTER — Ambulatory Visit (INDEPENDENT_AMBULATORY_CARE_PROVIDER_SITE_OTHER): Payer: BC Managed Care – PPO | Admitting: Cardiology

## 2013-01-02 VITALS — BP 160/93 | HR 85 | Ht 69.0 in | Wt 207.1 lb

## 2013-01-02 DIAGNOSIS — I2510 Atherosclerotic heart disease of native coronary artery without angina pectoris: Secondary | ICD-10-CM

## 2013-01-02 DIAGNOSIS — I10 Essential (primary) hypertension: Secondary | ICD-10-CM

## 2013-01-02 MED ORDER — RAMIPRIL 10 MG PO CAPS: 10.0000 mg | ORAL_CAPSULE | Freq: Every day | ORAL | Status: DC

## 2013-01-02 MED ORDER — SIMVASTATIN 20 MG PO TABS: 20.0000 mg | ORAL_TABLET | Freq: Every day | ORAL | Status: DC

## 2013-01-02 NOTE — Assessment & Plan Note (Signed)
We need better blood pressure control. His Altace dose will be increased from 5-10 mg. He will check his pressure at home and call us in from time to time to let me know how he is doing.

## 2013-01-02 NOTE — Progress Notes (Signed)
HPI   Patient is seen for followup coronary disease and hypertension. He rushing here today from the traffic. His blood pressure is mildly elevated. He takes it intermittently away from the office. It is not below 140 systolic all of the time. He's not been having any chest pain or shortness of breath.  Allergies  Allergen Reactions  . Rosuvastatin     REACTION: myalgias    Current Outpatient Prescriptions  Medication Sig Dispense Refill  . aspirin 81 MG tablet Take 81 mg by mouth daily.        . fish oil-omega-3 fatty acids 1000 MG capsule Take 500 g by mouth daily.       [name] KOMBIGLYZE XR 2.12-998 MG TB24 TAKE 1 TABLET BY MOUTH TWICE A DAY WITH A MEAL  60 tablet  2  . ramipril (ALTACE) 5 MG capsule Take 1 capsule (5 mg total) by mouth daily.  90 capsule  3  . simvastatin (ZOCOR) 20 MG tablet Take 1 tablet (20 mg total) by mouth at bedtime.  90 tablet  1   No current facility-administered medications for this visit.    History   Social History  . Marital Status: Single    Spouse Name: N/A    Number of Children: N/A  . Years of Education: N/A   Occupational History  . Emergency planning/management officer     Social History Main Topics  . Smoking status: Never Smoker   . Smokeless tobacco: Not on file  . Alcohol Use: No  . Drug Use: No  . Sexually Active: Not on file   Other Topics Concern  . Not on file   Social History Narrative  . No narrative on file    Family History  Problem Relation Age of Onset  . Hypertension Father   . Heart attack Father 65    bypass  . Diabetes Father   . Heart attack Paternal Grandfather     late 30s  . Cancer Maternal Grandfather     site unknown  . Prostate cancer Maternal Grandfather   . Diabetes Paternal Aunt     X 3  . Heart failure Maternal Grandmother   . Diabetes Mother   . Diabetes Paternal Uncle   . Stroke Neg Hx     Past Medical History  Diagnosis Date  . Food poisoning     age 33  . Rash   . Hypertension   . Hyperlipidemia     . Diabetes mellitus   . 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 and coursed between the pulmonary artery and aorta anterior to the aorta. EF 55% cath-01/24/03 60% echo June 2009  . Aortic valve sclerosis     echo june 2009  . PVC (premature ventricular contraction) 06/2009    November, 2010  . Carotid bruit 06/2009    Doppler, November, 2010, 0-39% bilateral  . GERD (gastroesophageal reflux disease)   . Muscle ache     on Crestor  09/04/09 CPK 193(7-232) tolerated simvastin in the past  . Ejection fraction     Ejection fraction 60%, echo, June, 2009  . Hx of CABG     [name],  x4  May 01, 2011 limited to the LAD, left radial artery to the OM, SVG to diagonal, SVG to posterior descending  . Carotid artery disease     Doppler, preop, September, 2012,  mild bilateral plaque  with no  significant stenoses  . Dizziness     June, 2013    Past Surgical History  Procedure Laterality Date  . Coccyx bone removed    . Pilonidal cystectomy    . Back surgery    . Intraoperative transesophageal echocardiography.  05/01/2011  . Coronary artery bypass graft  05/01/2011    CABG x4 with LIMA to LAD, LRA to OM, SVG to D1, SVG to PDA, EVH via left thigh    Patient Active Problem List   Diagnosis Date Noted  . Muscle ache     Priority: High  . Ejection fraction     Priority: High  . Carotid bruit 06/03/2009    Priority: High  . Dizziness   . Carotid artery disease   . Hx of CABG   . CAD (coronary artery disease)   . Hyperlipidemia   . Aortic valve sclerosis   . GERD (gastroesophageal reflux disease)   . KNEE PAIN, LEFT, CHRONIC 05/21/2010  . PVC (premature ventricular contraction) 06/03/2009  . DIABETES MELLITUS, TYPE II 03/27/2008  . HYPERTENSION 03/27/2008    ROS   Patient denies fever, chills, headache, sweats, rash, change in vision, change in  hearing, chest pain, cough, nausea vomiting, urinary symptoms. All other systems are reviewed and are negative.  PHYSICAL EXAM  Patient is oriented to person time and place. Affect is normal. There is no jugulovenous distention. Lungs are clear. Respiratory effort is nonlabored. Cardiac exam reveals S1 and S2. There no clicks or significant murmurs. The abdomen is soft. There is no peripheral edema.  Filed Vitals:   01/02/13 1617  BP: 160/93  Pulse: 85  Height: 5\' 9"  (1.753 m)  Weight: 207 lb 1.9 oz (93.949 kg)     ASSESSMENT & PLAN

## 2013-01-02 NOTE — Assessment & Plan Note (Signed)
Coronary disease is stable. No change in therapy. 

## 2013-01-02 NOTE — Patient Instructions (Addendum)
Increase Altace 10 mg daily    Your physician wants you to follow-up in: 6 months You will receive a reminder letter in the mail two months in advance. If you don't receive a letter, please call our office to schedule the follow-up appointment.

## 2013-01-17 ENCOUNTER — Encounter: Payer: Self-pay | Admitting: Internal Medicine

## 2013-01-17 ENCOUNTER — Ambulatory Visit (INDEPENDENT_AMBULATORY_CARE_PROVIDER_SITE_OTHER): Payer: BC Managed Care – PPO | Admitting: Internal Medicine

## 2013-01-17 VITALS — BP 122/80 | HR 79 | Temp 98.3°F | Wt 207.0 lb

## 2013-01-17 DIAGNOSIS — E785 Hyperlipidemia, unspecified: Secondary | ICD-10-CM

## 2013-01-17 DIAGNOSIS — E119 Type 2 diabetes mellitus without complications: Secondary | ICD-10-CM

## 2013-01-17 DIAGNOSIS — I10 Essential (primary) hypertension: Secondary | ICD-10-CM

## 2013-01-17 MED ORDER — INSULIN PEN NEEDLE 32G X 6 MM MISC: Status: DC

## 2013-01-17 MED ORDER — INSULIN DETEMIR 100 UNIT/ML FLEXPEN: 15.0000 [IU] | PEN_INJECTOR | Freq: Every day | SUBCUTANEOUS | Status: DC

## 2013-01-17 NOTE — Assessment & Plan Note (Addendum)
LDL has been @ goal ; he restricts HFCS & hyperglycemic carbs

## 2013-01-17 NOTE — Progress Notes (Signed)
Subjective:    Patient ID: [name] [name], male    DOB: [birth year], 62 y.o.   MRN: [id number]  HPI The patient is here for followup of diabetes, hyperlipidemia, and hypertension.  The most recent A1c was 8.4  , which correlates to an average sugar of 194 , and long-term risk of 68 % . Fasting blood sugar range 240-260 .  Highest two-hour postprandial glucose is averaging 300 . No hypoglycemia reported Excessive thirst;excessive urination Last ophthalmologic examination < 12 mos ago  revealed no retinopathy. No active podiatry assessment on record. Diet is low carb/low sugar . Exercise  irregular 2-4 X/ week as walking CVE for 45-60  minutes.  The most recent lipids   reveal LDL 64.6 ; HDL 30 ; and triglycerides 248  . There is medical compliance with the statin.Statin non compliance due to subjective myalgias arthralgias  Blood pressure average  120/80 . There is medical compliance with antihypertensive medications  No  medication adverse effects suggested .      Review of Systems Constitutional: No  significant weight change;  excess fatigue Eyes: No  blurred vision;  double vision ; loss of vision Cardiovascular:Irregular heart beat, hx of PVCs.  No chest pain ;palpitations; racing ;syncope; claudication ; edema  Respiratory: No exertional dyspnea;  paroxysmal nocturnal dyspnea Genitourinary: No dysuria; hematuria ; pyuria; frequency;  Incontinence;  nocturia Musculoskeletal: No myalgias or muscle cramping  Dermatologic: No non healing  Lesions; change in color or temperature of skin Neurologic: No  limb weakness;  numbness or tingling;  burning Endocrine: No change in hair, skin, nails. No  excessive hunger.         Objective:   Physical Exam Gen.: Adequately nourished in appearance. Alert, appropriate and cooperative throughout exam.  Head: Normocephalic without obvious abnormalities; goatee; head shaven  Eyes: No corneal or conjunctival inflammation noted. Neck:  No deformities, masses, or tenderness noted.  Thyroid normal. Lungs: Normal respiratory effort; chest expands symmetrically. Lungs are clear to auscultation without rales, wheezes, or increased work of breathing. Heart: Normal rate and rhythm. Normal S1 and S2. No gallop, click, or rub. S4 w/o murmur. Abdomen: Bowel sounds normal; abdomen soft and nontender. No masses, organomegaly or hernias noted.                             Musculoskeletal/extremities:  No clubbing, cyanosis, edema, or significant extremity  deformity noted. Range of motion normal .Tone & strength  Normal. Joints  reveal mild  DJD DIP changes. Nail health good. Able to lie down & sit up w/o help.  Vascular: Carotid, radial artery, dorsalis pedis and  posterior tibial pulses are full and equal. No bruits present. Neurologic: Alert and oriented x3. Deep tendon reflexes symmetrical and normal.   Light touch normal over feet.        Skin: Intact without suspicious lesions or rashes. Lymph: No cervical, axillary lymphadenopathy present. Psych: Mood and affect are normal. Normally interactive  Assessment & Plan:  See Current Assessment & Plan in Problem List under specific Diagnosis

## 2013-01-17 NOTE — Assessment & Plan Note (Signed)
BP well controlled; BMET ordered

## 2013-01-17 NOTE — Patient Instructions (Addendum)
Average your fasting sugars each week; increase the daily insulin dose by 2 units each day each week until the average fasting blood sugar is 140-160. At that time stop increasing the dose & repeat the A1c and urine microalbumin. 

## 2013-01-17 NOTE — Assessment & Plan Note (Signed)
Despite improved nutrition; his glucose readings suggest poorly controlled diabetes. Insulin instruction will be initiated pending recheck of the A1c. If A1c is less than 8; he will need a new glucometer

## 2013-01-18 LAB — BASIC METABOLIC PANEL
Calcium: 9.2 mg/dL (ref 8.4–10.5)
GFR: 130.61 mL/min (ref >60.00)
Sodium: 139 mEq/L (ref 135–145)

## 2013-01-18 LAB — MICROALBUMIN / CREATININE URINE RATIO
Creatinine,U: 85.2 mg/dL
Microalb, Ur: 0.3 mg/dL (ref 0.0–1.9)

## 2013-01-20 ENCOUNTER — Encounter: Payer: Self-pay | Admitting: *Deleted

## 2013-02-02 ENCOUNTER — Other Ambulatory Visit: Payer: Self-pay | Admitting: Internal Medicine

## 2013-03-21 ENCOUNTER — Other Ambulatory Visit: Payer: Self-pay

## 2013-03-21 DIAGNOSIS — E119 Type 2 diabetes mellitus without complications: Secondary | ICD-10-CM

## 2013-03-21 MED ORDER — INSULIN DETEMIR 100 UNIT/ML FLEXPEN: 35.0000 [IU] | PEN_INJECTOR | Freq: Every day | SUBCUTANEOUS | Status: DC

## 2013-05-23 ENCOUNTER — Other Ambulatory Visit: Payer: Self-pay | Admitting: Internal Medicine

## 2013-05-23 NOTE — Telephone Encounter (Signed)
Levemir Flexpen refill sent to pharmacy

## 2013-05-30 IMAGING — CR DG CHEST 2V
2 series · 2 of 2 positions shown · non-contrast
Comparison: None.

CLINICAL DATA: Preoperative exam prior to three-vessel CABG.
Arrhythmia. Diabetes.  Hypertension.

CHEST - 2 VIEW

[view not recorded (1 of 2)]
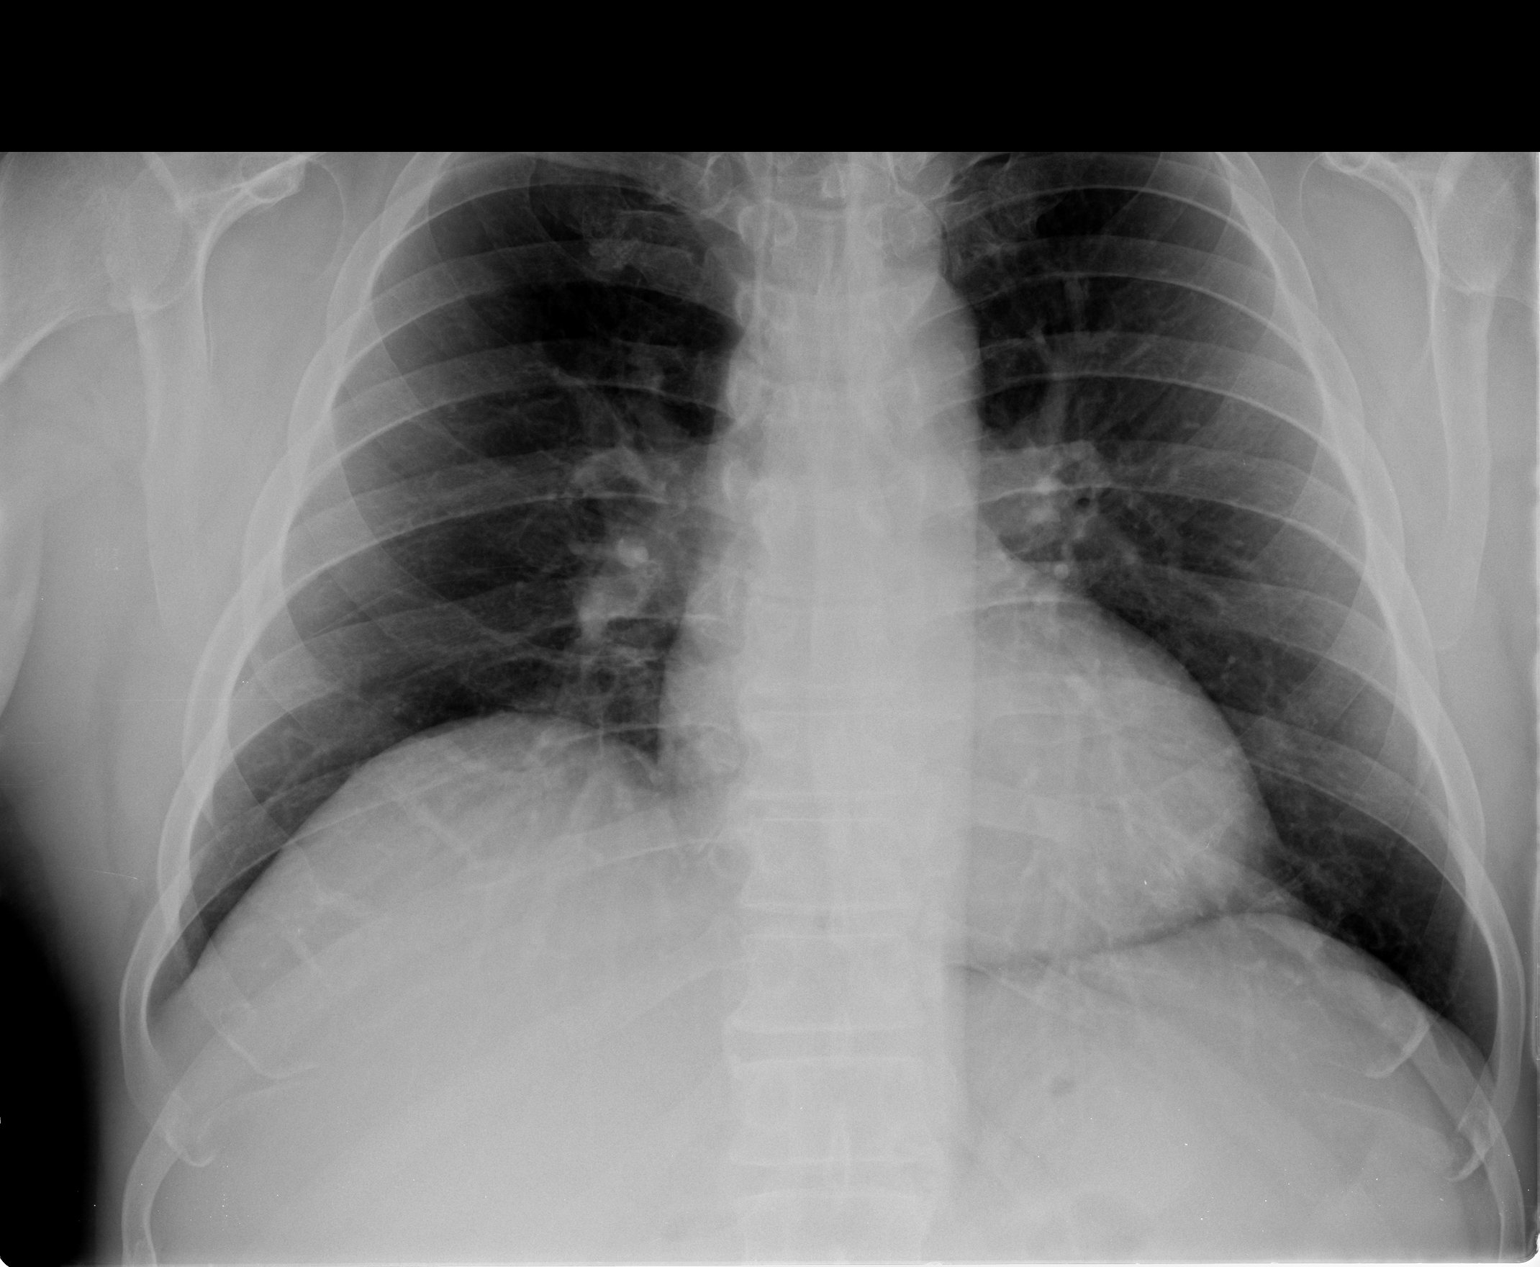

[view not recorded (2 of 2)]
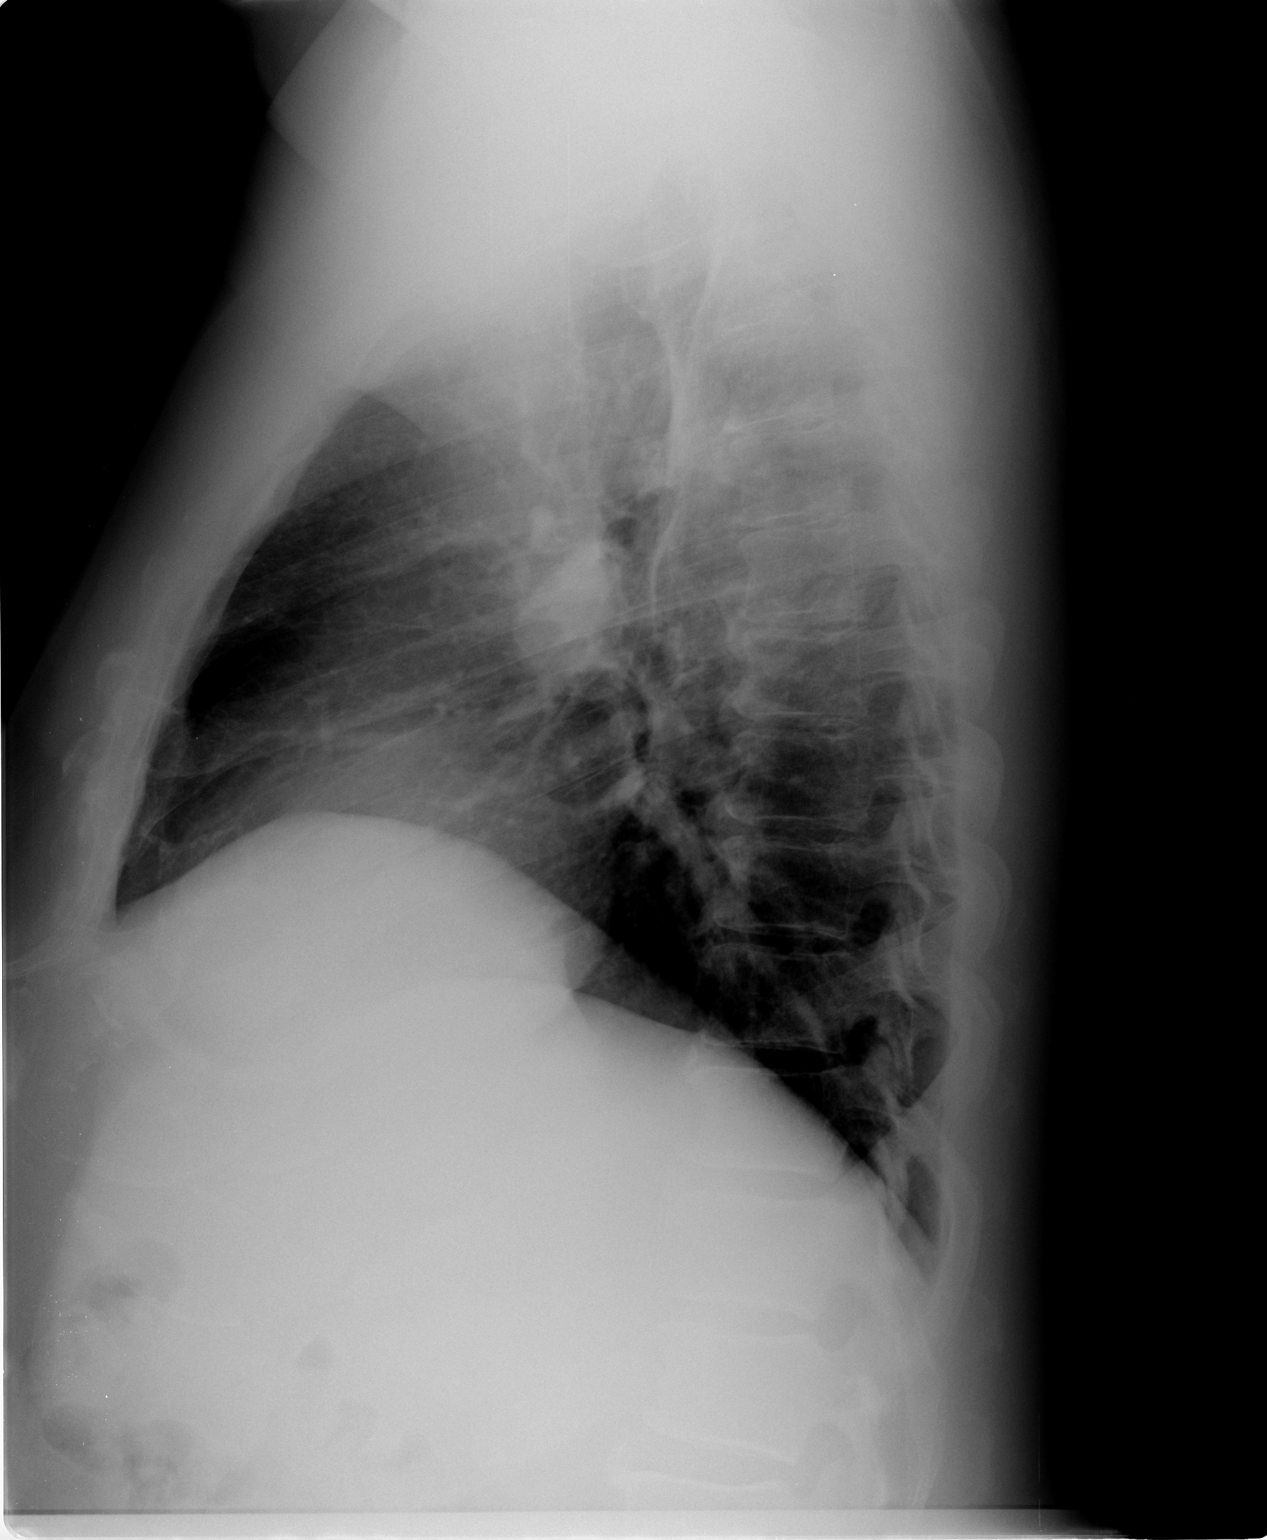

[2 of 2 positions shown; findings below may reference images not displayed]

FINDINGS: Cardiomediastinal silhouette is within normal limits. The
lungs are clear. No pleural effusion.  No pneumothorax.  No acute
osseous abnormality.
IMPRESSION: Normal chest.

## 2013-06-02 ENCOUNTER — Other Ambulatory Visit: Payer: Self-pay | Admitting: Internal Medicine

## 2013-06-02 NOTE — Telephone Encounter (Signed)
Kombiglyze HR refill sent to pharmacy

## 2013-08-08 ENCOUNTER — Ambulatory Visit (INDEPENDENT_AMBULATORY_CARE_PROVIDER_SITE_OTHER): Payer: BC Managed Care – PPO | Admitting: Internal Medicine

## 2013-08-08 ENCOUNTER — Encounter: Payer: Self-pay | Admitting: Internal Medicine

## 2013-08-08 VITALS — BP 131/74 | HR 64 | Temp 98.4°F | Resp 16 | Wt 214.0 lb

## 2013-08-08 DIAGNOSIS — E1159 Type 2 diabetes mellitus with other circulatory complications: Secondary | ICD-10-CM

## 2013-08-08 LAB — BASIC METABOLIC PANEL
BUN: 13 mg/dL (ref 6–23)
CALCIUM: 9.5 mg/dL (ref 8.4–10.5)
CO2: 27 mEq/L (ref 19–32)
CREATININE: 0.7 mg/dL (ref 0.4–1.5)
Chloride: 102 mEq/L (ref 96–112)
GFR: 117.92 mL/min (ref >60.00)
Glucose, Bld: 271 mg/dL — ABNORMAL HIGH (ref 70–99)
Potassium: 4.3 mEq/L (ref 3.5–5.1)
Sodium: 137 mEq/L (ref 135–145)

## 2013-08-08 LAB — MICROALBUMIN / CREATININE URINE RATIO
Creatinine,U: 97 mg/dL
MICROALB UR: 0.6 mg/dL (ref 0.0–1.9)
Microalb Creat Ratio: 0.6 mg/g (ref 0.0–30.0)

## 2013-08-08 LAB — HEMOGLOBIN A1C: HEMOGLOBIN A1C: 8.9 % — AB (ref 4.6–6.5)

## 2013-08-08 MED ORDER — GLUCOSE BLOOD VI STRP: ORAL_STRIP | Status: DC

## 2013-08-08 MED ORDER — ONETOUCH DELICA LANCETS 33G MISC: Status: DC

## 2013-08-08 NOTE — Progress Notes (Signed)
Pre-visit discussion using our clinic review tool. No additional management support is needed unless otherwise documented below in the visit note.  

## 2013-08-08 NOTE — Progress Notes (Signed)
Subjective:    Patient ID: [name] [name], male    DOB: 05-Feb-1951, 63 y.o.   MRN: [id number]  HPI Diabetes status assessment: Fasting or morning glucose range 180-315 despite 50 units Levemir. Highest glucose 2 hours after any meal is not monitored. No hypoglycemia reported.                                                                                                                Regular exercise described as walking 3-5 mpd  3- 5 times per week . No specific nutrition/diet followed Medication compliance is good. No medication adverse effects noted. Eye exam current. Foot care not current  A1c 9.2%  6/14 (average sugar 250 with long-term 84 %  risk )    Review of Systems No excess thirst ;  excess hunger ; or excess urination reported                              No lightheadedness with standing reported No chest pain ; palpitations ; claudication described .                                                                                                                             No non healing skin  ulcers or sores of extremities noted. No numbness or tingling or burning in feet described                                                                                                                                             Change in weight of 5 # pound over holidays. No blurred,double, or loss of vision reported  .            Objective:   Physical Exam Appears healthy and well-nourished & in no acute distress  No  carotid bruits are present.No neck pain distention present at 10 - 15 degrees. Thyroid normal to palpation  Heart rhythm and rate are normal with no significant murmurs or gallops. S4  Chest is clear with no increased work of breathing  There is no evidence of aortic aneurysm or renal artery bruits  Abdomen soft with no organomegaly or masses. No HJR  No clubbing, cyanosis or edema present.  Pedal pulses are intact   DJD DIP changes  No  ischemic skin changes are present . Nails healthy    Alert and oriented. Strength, tone, DTRs reflexes normal          Assessment & Plan:  See Current Assessment & Plan in Problem List under specific Diagnosis

## 2013-08-08 NOTE — Assessment & Plan Note (Signed)
A1c Nutrition referral

## 2013-08-08 NOTE — Patient Instructions (Signed)
Your next office appointment will be determined based upon review of your pending labs . Those instructions will be transmitted to you through My Chart . Check glucose once daily if possible Fasting or morning glucose recommended M, W, F, & Sun if possible. Goal= 100-150 Glucose 2 hours after breakfast Tues, after lunch Thurs & 2 hrs after eve meal Sat if possible. Goal = < 180

## 2013-08-09 ENCOUNTER — Telehealth: Payer: Self-pay

## 2013-08-09 ENCOUNTER — Other Ambulatory Visit: Payer: Self-pay | Admitting: Internal Medicine

## 2013-08-09 DIAGNOSIS — E1159 Type 2 diabetes mellitus with other circulatory complications: Secondary | ICD-10-CM

## 2013-08-09 NOTE — Telephone Encounter (Signed)
Relevant patient education mailed to patient.  

## 2013-08-14 ENCOUNTER — Encounter: Payer: Self-pay | Admitting: *Deleted

## 2013-09-04 ENCOUNTER — Other Ambulatory Visit: Payer: Self-pay | Admitting: Internal Medicine

## 2013-09-05 ENCOUNTER — Other Ambulatory Visit: Payer: Self-pay | Admitting: *Deleted

## 2013-09-05 MED ORDER — INSULIN DETEMIR 100 UNIT/ML FLEXPEN: PEN_INJECTOR | SUBCUTANEOUS | Status: DC

## 2013-09-05 NOTE — Telephone Encounter (Signed)
Novofine insulin pen needles refilled per protocol. JG//CMA 

## 2013-09-15 ENCOUNTER — Ambulatory Visit: Payer: BC Managed Care – PPO | Admitting: Dietician

## 2013-10-11 ENCOUNTER — Telehealth: Payer: Self-pay

## 2013-10-11 MED ORDER — SAXAGLIPTIN-METFORMIN ER 2.5-1000 MG PO TB24: ORAL_TABLET | ORAL | Status: DC

## 2013-10-11 NOTE — Telephone Encounter (Signed)
The patient called the [facility] office and is need of his tablet for diabetes.  He states he is about to go on a trip, and is need of this med.    Callback - [phone]

## 2013-10-11 NOTE — Telephone Encounter (Signed)
Rx sent to the pharmacy by e-script.  Pt aware.//AB/CMA 

## 2013-10-12 ENCOUNTER — Other Ambulatory Visit: Payer: Self-pay | Admitting: *Deleted

## 2013-10-12 DIAGNOSIS — E119 Type 2 diabetes mellitus without complications: Secondary | ICD-10-CM

## 2013-10-12 MED ORDER — SAXAGLIPTIN-METFORMIN ER 2.5-1000 MG PO TB24
ORAL_TABLET | ORAL | Status: DC
Start: 2013-10-12 — End: 2014-04-18

## 2013-10-12 NOTE — Telephone Encounter (Signed)
Refill for [facility]ze [facility] per pt request.

## 2014-01-29 ENCOUNTER — Other Ambulatory Visit: Payer: Self-pay

## 2014-01-29 MED ORDER — INSULIN DETEMIR 100 UNIT/ML FLEXPEN: PEN_INJECTOR | SUBCUTANEOUS | Status: DC

## 2014-01-31 ENCOUNTER — Other Ambulatory Visit: Payer: Self-pay | Admitting: Cardiology

## 2014-03-05 ENCOUNTER — Other Ambulatory Visit: Payer: Self-pay | Admitting: Cardiology

## 2014-03-14 ENCOUNTER — Other Ambulatory Visit: Payer: Self-pay | Admitting: Cardiology

## 2014-03-29 ENCOUNTER — Other Ambulatory Visit: Payer: Self-pay | Admitting: Cardiology

## 2014-04-16 ENCOUNTER — Other Ambulatory Visit: Payer: Self-pay | Admitting: Cardiology

## 2014-04-18 ENCOUNTER — Other Ambulatory Visit: Payer: Self-pay

## 2014-04-18 MED ORDER — SAXAGLIPTIN-METFORMIN ER 2.5-1000 MG PO TB24: ORAL_TABLET | ORAL | Status: DC

## 2014-04-19 ENCOUNTER — Other Ambulatory Visit: Payer: Self-pay

## 2014-04-19 MED ORDER — SAXAGLIPTIN-METFORMIN ER 2.5-1000 MG PO TB24: ORAL_TABLET | ORAL | Status: DC

## 2014-04-24 ENCOUNTER — Other Ambulatory Visit: Payer: Self-pay | Admitting: Cardiology

## 2014-04-25 ENCOUNTER — Ambulatory Visit (INDEPENDENT_AMBULATORY_CARE_PROVIDER_SITE_OTHER): Payer: BC Managed Care – PPO | Admitting: Physician Assistant

## 2014-04-25 ENCOUNTER — Other Ambulatory Visit: Payer: Self-pay | Admitting: *Deleted

## 2014-04-25 ENCOUNTER — Encounter: Payer: Self-pay | Admitting: Physician Assistant

## 2014-04-25 VITALS — BP 140/82 | HR 59 | Ht 69.0 in | Wt 210.0 lb

## 2014-04-25 DIAGNOSIS — E785 Hyperlipidemia, unspecified: Secondary | ICD-10-CM

## 2014-04-25 DIAGNOSIS — I739 Peripheral vascular disease, unspecified: Secondary | ICD-10-CM

## 2014-04-25 DIAGNOSIS — I779 Disorder of arteries and arterioles, unspecified: Secondary | ICD-10-CM

## 2014-04-25 DIAGNOSIS — E1159 Type 2 diabetes mellitus with other circulatory complications: Secondary | ICD-10-CM

## 2014-04-25 DIAGNOSIS — I2510 Atherosclerotic heart disease of native coronary artery without angina pectoris: Secondary | ICD-10-CM

## 2014-04-25 DIAGNOSIS — I10 Essential (primary) hypertension: Secondary | ICD-10-CM

## 2014-04-25 LAB — LIPID PANEL
Cholesterol: 127 mg/dL (ref 0–200)
HDL: 31 mg/dL — ABNORMAL LOW (ref >39.00)
LDL CALC: 64 mg/dL (ref 0–99)
NONHDL: 96
TRIGLYCERIDES: 162 mg/dL — AB (ref 0.0–149.0)
Total CHOL/HDL Ratio: 4
VLDL: 32.4 mg/dL (ref 0.0–40.0)

## 2014-04-25 LAB — COMPREHENSIVE METABOLIC PANEL
ALK PHOS: 36 U/L — AB (ref 39–117)
ALT: 26 U/L (ref 0–53)
AST: 20 U/L (ref 0–37)
Albumin: 4.4 g/dL (ref 3.5–5.2)
BUN: 13 mg/dL (ref 6–23)
CO2: 28 mEq/L (ref 19–32)
Calcium: 9.1 mg/dL (ref 8.4–10.5)
Chloride: 103 mEq/L (ref 96–112)
Creatinine, Ser: 0.6 mg/dL (ref 0.4–1.5)
GFR: 137.24 mL/min (ref >60.00)
Glucose, Bld: 249 mg/dL — ABNORMAL HIGH (ref 70–99)
Potassium: 4.3 mEq/L (ref 3.5–5.1)
Sodium: 137 mEq/L (ref 135–145)
Total Bilirubin: 0.8 mg/dL (ref 0.2–1.2)
Total Protein: 7.5 g/dL (ref 6.0–8.3)

## 2014-04-25 LAB — CBC
HEMATOCRIT: 41.2 % (ref 39.0–52.0)
HEMOGLOBIN: 14.2 g/dL (ref 13.0–17.0)
MCHC: 34.6 g/dL (ref 30.0–36.0)
MCV: 88.4 fl (ref 78.0–100.0)
PLATELETS: 155 10*3/uL (ref 150.0–400.0)
RBC: 4.66 Mil/uL (ref 4.22–5.81)
RDW: 13.3 % (ref 11.5–15.5)
WBC: 5 10*3/uL (ref 4.0–10.5)

## 2014-04-25 MED ORDER — RAMIPRIL 10 MG PO CAPS: 10.0000 mg | ORAL_CAPSULE | Freq: Every day | ORAL | Status: DC

## 2014-04-25 NOTE — Patient Instructions (Signed)
Your physician recommends that you continue on your current medications as directed. Please refer to the Current Medication list given to you today.   Your physician recommends that you return for lab work in:   CBC, LIPIDS, CMET TODAY    Your physician recommends that you schedule a follow-up appointment in:  WITH DR KATZ IN 6 WEEKS

## 2014-04-25 NOTE — Progress Notes (Signed)
[address] [place], [place]  [address] Phone: [phone] Fax:  [phone]  Date:  04/25/2014   Patient ID:  [name] [name], [name] [name] 1951-07-10, MRN [id number]   PCP:  Marga Melnick, MD  Cardiologist:  Dr. Myrtis Ser   History of Present Illness: [name] [name] is a 63 y.o. male with history of CAD s/p DES to RCA 2004 and CABG in 2012, preserved LV function 2012, HTN, HL, DM with prior myalgias on Crestor, mild carotid plaque without significant stenosis 2012 who presents to the office for follow-up. He went to refill his ramipril and was told he needed an OV. He feels great. No CP, SOB, syncope, dizziness, palpitations or bleeding. 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 He wants to try to lose some more weight. Has been working with PCP on his DM.  Echo 2009 - aortic valve thickness increased, mild AI, mild MR, mild TR, nl LVF.  Recent Labs: 08/08/2013: Creatinine 0.7; Potassium 4.3   Wt Readings from Last 3 Encounters:  04/25/14 210 lb (95.255 kg)  08/08/13 214 lb (97.07 kg)  01/17/13 207 lb (93.895 kg)     Past Medical History  Diagnosis Date  . Food poisoning     age 63  . Rash   . Hypertension   . Hyperlipidemia   . Diabetes mellitus   . CAD (coronary artery disease)     a. 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 b. s/p CABG 04/2011.  [name] Aortic valve sclerosis     echo june 2009  . PVC (premature ventricular contraction) 06/2009    November, 2010  . GERD (gastroesophageal reflux disease)   . Muscle ache     on Crestor  09/04/09 CPK 193(7-232) tolerated simvastatin in the past  . Ejection fraction     a. Ejection fraction 60%, echo, June, 2009. b. EF preserved by cath 2012.  [name] Hx of CABG     [name],  x4  May 01, 2011 limited to the LAD, left radial artery to the OM, SVG to diagonal, SVG to posterior descending  . Carotid artery disease     Doppler, preop, September, 2012,  mild bilateral  plaque with no significant stenoses  . Dizziness     June, 2013    Current Outpatient Prescriptions  Medication Sig Dispense Refill  . aspirin 81 MG tablet Take 81 mg by mouth daily.        . Insulin Detemir (LEVEMIR) 100 UNIT/ML Pen 61 Units. INJECT 61 UNITS SUBCUTANEOUSLY EVERY DAY      . Omega-3 Fatty Acids (FISH OIL) 500 MG CAPS Take 500 mg by mouth daily.      . ramipril (ALTACE) 10 MG capsule Take 1 capsule (10 mg total) by mouth daily.  90 capsule  2  . Saxagliptin-Metformin (KOMBIGLYZE XR) 2.12-998 MG TB24 TAKE 1 TABLET TWICE A DAY WITH FOOD Dx: 250.72  60 tablet  5  . simvastatin (ZOCOR) 20 MG tablet TAKE 1 TABLET BY MOUTH AT BEDTIME.  90 tablet  2  . glucose blood test strip One touch ultra blue test strip. Test blood sugar once daily. Dx code: 250.00  100 each  12  . NOVOFINE 32G X 6 MM MISC USE ONCE DAILY WITH LEVIMIR INJECTION  100 each  11  . ONETOUCH DELICA LANCETS 33G MISC Test blood sugar once daily. Dx code: 250.00  100 each  12   No current facility-administered  medications for this visit.    Allergies:   Rosuvastatin   Social History:  The patient  reports that he has never smoked. He does not have any smokeless tobacco history on file. He reports that he does not drink alcohol or use illicit drugs.   Family History:  The patient's family history includes Cancer in his maternal grandfather; Diabetes in his father, mother, paternal aunt, and paternal uncle; Heart attack in his paternal grandfather; Heart attack (age of onset: 7) in his father; Heart failure in his maternal grandmother; Hypertension in his father; Prostate cancer in his maternal grandfather. There is no history of Stroke.   ROS:  Please see the history of present illness.  All other systems reviewed and negative.   PHYSICAL EXAM:  VS:  BP 140/82  Pulse 59  Ht  (1.753 m)  Wt 210 lb (95.255 kg)  BMI 31.00 kg/m2 Well nourished, well developed, in no acute distress HEENT: normal Neck: no  JVD Cardiac:  normal S1, S2; RRR; no murmur Lungs:  clear to auscultation bilaterally, no wheezing, rhonchi or rales Abd: soft, nontender, no hepatomegaly Ext: no edema Skin: warm and dry Neuro:  moves all extremities spontaneously, no focal abnormalities noted  EKG:  Sinus bradycardia 59bpm, incomplete LBBB (QRS 114), no acute ST-T changes, appears similar to prior  ASSESSMENT AND PLAN:  1. CAD s/p prior PCI, CABG - doing well. No anginal symptoms. Continue aspirin, statin. Not on BB due to resting sinus bradycardia. Will update labs today since it's been a while - CBC, CMET, lipids. 2. HTN - BP at home mostly run 130s. I asked him to monitor this regularly and call us if it begins to creep up, given a goal of <130/80. OK to refill ramipril. 3. Hyperlipidemia - recheck CMET, lipids today. 4. Carotid duplex 2012 without mild plaque but no significant disease - no bruit heard on exam today. No neurologic sx.  5. Diabetes mellitus - followed by PCP. 6. Obesity Body mass index is 31 kg/(m^2). - he wants to work on losing weight. He really enjoys walking. We discussed interval training with brief increases in speeds during his dog walks as tolerated to increase calorie burn and endurance.  Dispo: F/u Dr. Myrtis Ser 6 months.  Signed, Ronie Spies, PA-C  04/25/2014 9:55 AM

## 2014-04-26 ENCOUNTER — Telehealth: Payer: Self-pay | Admitting: *Deleted

## 2014-04-26 NOTE — Telephone Encounter (Signed)
s/w pt about lab results. Pt advised of recommendations from Ronie Spies, [place]. Pt vebralized understanding to Plan of Care

## 2014-06-27 ENCOUNTER — Telehealth: Payer: Self-pay | Admitting: Internal Medicine

## 2014-06-27 NOTE — Telephone Encounter (Signed)
Pt called request shingle injection. Please advise, pt was wondering if he can get it on Monday 07/02/14. Pt will call insurance to check how much they will cover.

## 2014-06-27 NOTE — Telephone Encounter (Signed)
OK 

## 2014-07-10 ENCOUNTER — Other Ambulatory Visit: Payer: Self-pay

## 2014-07-10 MED ORDER — INSULIN DETEMIR 100 UNIT/ML FLEXPEN: 35.0000 [IU] | PEN_INJECTOR | Freq: Every day | SUBCUTANEOUS | Status: DC

## 2014-10-15 ENCOUNTER — Other Ambulatory Visit: Payer: Self-pay

## 2014-10-15 MED ORDER — SAXAGLIPTIN-METFORMIN ER 2.5-1000 MG PO TB24: ORAL_TABLET | ORAL | Status: DC

## 2014-10-24 ENCOUNTER — Encounter: Payer: Self-pay | Admitting: Cardiology

## 2014-10-24 ENCOUNTER — Ambulatory Visit (INDEPENDENT_AMBULATORY_CARE_PROVIDER_SITE_OTHER): Payer: BLUE CROSS/BLUE SHIELD | Admitting: Cardiology

## 2014-10-24 VITALS — BP 140/78 | HR 64 | Ht 69.0 in | Wt 210.0 lb

## 2014-10-24 DIAGNOSIS — E785 Hyperlipidemia, unspecified: Secondary | ICD-10-CM | POA: Diagnosis not present

## 2014-10-24 DIAGNOSIS — I25810 Atherosclerosis of coronary artery bypass graft(s) without angina pectoris: Secondary | ICD-10-CM

## 2014-10-24 MED ORDER — ATORVASTATIN CALCIUM 20 MG PO TABS: 20.0000 mg | ORAL_TABLET | Freq: Every day | ORAL | Status: DC

## 2014-10-24 NOTE — Assessment & Plan Note (Signed)
The patient is LDL has been in a good range. However he is not on guideline directed therapy yet. We will change him to atorvastatin 20 from simvastatin 20. We had a careful discussion about this. Then I'll see him back in follow-up. If he has any symptoms we will switch back to simvastatin.

## 2014-10-24 NOTE — Patient Instructions (Signed)
**Note De-Identified [name] [name] Obfuscation** Your physician has recommended you make the following change in your medication: stop taking Simvastatin and start taking Atorvastatin 20 mg daily.   Your physician recommends that you schedule a follow-up appointment in: 8 weeks

## 2014-10-24 NOTE — Progress Notes (Signed)
Cardiology Office Note   Date:  10/24/2014   ID:  [name] [name], DOB 1950-12-15, MRN [id number]  PCP:  [name], MD  Cardiologist:  Willa RoughJeffrey Yannis Gumbs, MD   Chief Complaint  Patient presents with  . Appointment   follow-up coronary artery disease    History of Present Illness: [name] [name] is a 64 y.o. male who presents today to follow-up coronary artery disease. He is doing well. He's not having any chest pain or shortness of breath. He continues to try to lose weight but has only been able to hold relatively stable. He is not having chest pain or shortness of breath.    Past Medical History  Diagnosis Date  . Food poisoning     age 64  . Rash   . Hypertension   . Hyperlipidemia   . Diabetes mellitus   . CAD (coronary artery disease)     a. 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 b. s/p CABG 04/2011.  [name]. Aortic valve sclerosis     echo june 2009  . PVC (premature ventricular contraction) 06/2009    November, 2010  . GERD (gastroesophageal reflux disease)   . Muscle ache     on Crestor  09/04/09 CPK 193(7-232) tolerated simvastatin in the past  . Ejection fraction     a. Ejection fraction 60%, echo, June, 2009. b. EF preserved by cath 2012.  [name]. Hx of CABG     [name],  x4  May 01, 2011 limited to the LAD, left radial artery to the OM, SVG to diagonal, SVG to posterior descending  . Carotid artery disease     Doppler, preop, September, 2012,  mild bilateral plaque with no significant stenoses  . Dizziness     June, 2013    Past Surgical History  Procedure Laterality Date  . Coccyx bone removed    . Pilonidal cystectomy    . Back surgery    . Intraoperative transesophageal echocardiography.  05/01/2011  . Coronary artery bypass graft  05/01/2011    CABG x4 with LIMA to LAD, LRA to OM, SVG to D1, SVG to PDA, EVH via left thigh    Patient Active Problem List   Diagnosis Date Noted  . Muscle ache     Priority: High   . Ejection fraction     Priority: High  . Carotid bruit 06/03/2009    Priority: High  . Dizziness   . Carotid artery disease   . Hx of CABG   . CAD (coronary artery disease)   . Hyperlipidemia   . Aortic valve sclerosis   . GERD (gastroesophageal reflux disease)   . KNEE PAIN, LEFT, CHRONIC 05/21/2010  . PVC (premature ventricular contraction) 06/03/2009  . Type II or unspecified type diabetes mellitus with peripheral circulatory disorders, uncontrolled(250.72) 03/27/2008  . HYPERTENSION 03/27/2008      Current Outpatient Prescriptions  Medication Sig Dispense Refill  . aspirin 81 MG tablet Take 81 mg by mouth daily.      . Insulin Detemir (LEVEMIR) 100 UNIT/ML Pen Inject 35 Units into the skin daily. 15 mL 5  . NOVOFINE 32G X 6 MM MISC USE ONCE DAILY WITH LEVIMIR INJECTION 100 each 11  . Omega-3 Fatty Acids (FISH OIL) 500 MG CAPS Take 500 mg by mouth daily.    . ramipril (ALTACE) 10 MG capsule Take 1 capsule (10 mg total) by mouth daily. 90 capsule 2  . Saxagliptin-Metformin (KOMBIGLYZE XR) 2.12-998 MG  TB24 TAKE 1 TABLET TWICE A DAY WITH FOOD Dx: 250.72 60 tablet 0  . simvastatin (ZOCOR) 20 MG tablet TAKE 1 TABLET BY MOUTH AT BEDTIME. 90 tablet 2  . glucose blood test strip One touch ultra blue test strip. Test blood sugar once daily. Dx code: 250.00 100 each 12  . ONETOUCH DELICA LANCETS 33G MISC Test blood sugar once daily. Dx code: 250.00 100 each 12   No current facility-administered medications for this visit.    Allergies:   Rosuvastatin    Social History:  The patient  reports that he has never smoked. He does not have any smokeless tobacco history on file. He reports that he does not drink alcohol or use illicit drugs.   Family History:  The patient's family history includes Cancer in his maternal grandfather; Diabetes in his father, mother, paternal aunt, and paternal uncle; Heart attack in his paternal grandfather; Heart attack (age of onset: 54) in his father;  Heart failure in his maternal grandmother; Hypertension in his father; Prostate cancer in his maternal grandfather. There is no history of Stroke.    ROS:  Please see the history of present illness.     Patient denies fever, chills, headache, sweats, rash, change in vision, change in hearing, chest pain, cough, nausea or vomiting, urinary symptoms. All other systems are reviewed and are negative.   PHYSICAL EXAM: VS:  BP 140/78 mmHg  Pulse 64  Ht  (1.753 m)  Wt 210 lb (95.255 kg)  BMI 31.00 kg/m2 , Patient is oriented to person time and place. Affect is normal. He is quite stable today. He is overweight. Head is atraumatic. Sclera and conjunctiva are normal. There is no jugular venous distention. Lungs are clear. Respiratory effort is not labored. Cardiac exam reveals an S1 and S2. The abdomen is soft. There is no peripheral edema. There are no musculoskeletal deformities. There are no skin rashes.  EKG:   EKG is not done today.   Recent Labs: 04/25/2014: ALT 26; BUN 13; Creatinine 0.6; Hemoglobin 14.2; Platelets 155.0; Potassium 4.3; Sodium 137    Lipid Panel    Component Value Date/Time   CHOL 127 04/25/2014 0957   TRIG 162.0* 04/25/2014 0957   HDL 31.00* 04/25/2014 0957   CHOLHDL 4 04/25/2014 0957   VLDL 32.4 04/25/2014 0957   LDLCALC 64 04/25/2014 0957   LDLDIRECT 64.6 09/21/2012 0856      Wt Readings from Last 3 Encounters:  10/24/14 210 lb (95.255 kg)  04/25/14 210 lb (95.255 kg)  08/08/13 214 lb (97.07 kg)      Current medicines are reviewed  The patient understands his medications.     ASSESSMENT AND PLAN:

## 2014-10-24 NOTE — Assessment & Plan Note (Signed)
Coronary disease is stable. Patient is on aspirin. In the past she had some difficulties with Crestor. He has tolerated simvastatin. I had a long discussion with him about trying atorvastatin and he is willing to proceed.

## 2014-11-14 ENCOUNTER — Telehealth: Payer: Self-pay | Admitting: Internal Medicine

## 2014-11-14 NOTE — Telephone Encounter (Signed)
Phone call to patient and left a message for him to call back. His last office visit with Dr Alwyn RenHopper was on 08/08/13. Last a1c lab was drawn on 08/08/13. Patient is due for an office visit.

## 2014-11-14 NOTE — Telephone Encounter (Signed)
Pt called in said he is needing refill on his Saxagliptin-Metformin (KOMBIGLYZE XR) 2.12-998 MG TB24 [[id number].  He wasn't sure if he needs an appt for this refill.  He said he was just here in [place]  [facility] [place]

## 2014-11-20 ENCOUNTER — Ambulatory Visit (INDEPENDENT_AMBULATORY_CARE_PROVIDER_SITE_OTHER): Payer: BLUE CROSS/BLUE SHIELD | Admitting: Internal Medicine

## 2014-11-20 ENCOUNTER — Encounter: Payer: Self-pay | Admitting: Internal Medicine

## 2014-11-20 ENCOUNTER — Other Ambulatory Visit (INDEPENDENT_AMBULATORY_CARE_PROVIDER_SITE_OTHER): Payer: BLUE CROSS/BLUE SHIELD

## 2014-11-20 VITALS — BP 122/82 | HR 69 | Temp 98.1°F | Ht 69.0 in | Wt 206.0 lb

## 2014-11-20 DIAGNOSIS — E1165 Type 2 diabetes mellitus with hyperglycemia: Secondary | ICD-10-CM

## 2014-11-20 DIAGNOSIS — E1159 Type 2 diabetes mellitus with other circulatory complications: Secondary | ICD-10-CM | POA: Diagnosis not present

## 2014-11-20 DIAGNOSIS — E1151 Type 2 diabetes mellitus with diabetic peripheral angiopathy without gangrene: Secondary | ICD-10-CM

## 2014-11-20 DIAGNOSIS — M542 Cervicalgia: Secondary | ICD-10-CM

## 2014-11-20 DIAGNOSIS — E785 Hyperlipidemia, unspecified: Secondary | ICD-10-CM

## 2014-11-20 DIAGNOSIS — IMO0002 Reserved for concepts with insufficient information to code with codable children: Secondary | ICD-10-CM

## 2014-11-20 LAB — LIPID PANEL
Cholesterol: 135 mg/dL (ref 0–200)
HDL: 31.7 mg/dL — ABNORMAL LOW (ref >39.00)
Total CHOL/HDL Ratio: 4
Triglycerides: 405 mg/dL — ABNORMAL HIGH (ref 0.0–149.0)

## 2014-11-20 LAB — TSH: TSH: 2.3 u[IU]/mL (ref 0.35–4.50)

## 2014-11-20 LAB — BASIC METABOLIC PANEL
BUN: 13 mg/dL (ref 6–23)
CALCIUM: 9.6 mg/dL (ref 8.4–10.5)
CO2: 26 mEq/L (ref 19–32)
CREATININE: 0.65 mg/dL (ref 0.40–1.50)
Chloride: 102 mEq/L (ref 96–112)
GFR: 132.13 mL/min (ref >60.00)
GLUCOSE: 283 mg/dL — AB (ref 70–99)
POTASSIUM: 4.2 meq/L (ref 3.5–5.1)
Sodium: 135 mEq/L (ref 135–145)

## 2014-11-20 LAB — MICROALBUMIN / CREATININE URINE RATIO
CREATININE, U: 124.1 mg/dL
MICROALB/CREAT RATIO: 1.1 mg/g (ref 0.0–30.0)
Microalb, Ur: 1.4 mg/dL (ref 0.0–1.9)

## 2014-11-20 LAB — HEMOGLOBIN A1C: Hgb A1c MFr Bld: 9.2 % — ABNORMAL HIGH (ref 4.6–6.5)

## 2014-11-20 LAB — LDL CHOLESTEROL, DIRECT: Direct LDL: 56 mg/dL
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
## 2014-11-20 NOTE — Progress Notes (Signed)
Pre visit review using our clinic review tool, if applicable. No additional management support is needed unless otherwise documented below in the visit note. 

## 2014-11-20 NOTE — Patient Instructions (Signed)
  Your next office appointment will be determined based upon review of your pending labs.  Those instructions will be transmitted to you by mail for your records.  Critical results will be called.   Followup as needed for any active or acute issue. Please report any significant change in your symptoms.  Use an anti-inflammatory cream such as Aspercreme or Zostrix cream twice a day to the affected area as needed. In lieu of this warm moist compresses or  hot water bottle can be used. Do not apply ice .

## 2014-11-22 ENCOUNTER — Telehealth: Payer: Self-pay | Admitting: Internal Medicine

## 2014-11-22 ENCOUNTER — Other Ambulatory Visit: Payer: Self-pay | Admitting: Internal Medicine

## 2014-11-22 DIAGNOSIS — E1151 Type 2 diabetes mellitus with diabetic peripheral angiopathy without gangrene: Secondary | ICD-10-CM

## 2014-11-22 DIAGNOSIS — IMO0002 Reserved for concepts with insufficient information to code with codable children: Secondary | ICD-10-CM

## 2014-11-22 DIAGNOSIS — E1165 Type 2 diabetes mellitus with hyperglycemia: Secondary | ICD-10-CM

## 2014-11-22 NOTE — Telephone Encounter (Signed)
He can stay on this and the insulin until seen by Endocrinology. They may change it after evaluating his diabetes

## 2014-11-22 NOTE — Telephone Encounter (Signed)
Pt called and wanted to know is he suppose to be taking anything for his Diabetes?  He was taking the Saxagliptin-Metformin (KOMBIGLYZE XR) 2.12-998 MG TB24 [[id number]]    Best number  [phone]

## 2014-11-23 MED ORDER — SAXAGLIPTIN-METFORMIN ER 2.5-1000 MG PO TB24: ORAL_TABLET | ORAL | Status: DC

## 2014-11-23 NOTE — Telephone Encounter (Signed)
Notified pt with md response. Pt stated he is needing refill on the Kombiglyze. Inform pt will send to pharmacy...Raechel Chute/lmb

## 2014-11-29 ENCOUNTER — Other Ambulatory Visit: Payer: Self-pay | Admitting: Internal Medicine

## 2014-12-12 ENCOUNTER — Ambulatory Visit (INDEPENDENT_AMBULATORY_CARE_PROVIDER_SITE_OTHER): Payer: BLUE CROSS/BLUE SHIELD | Admitting: Cardiology

## 2014-12-12 ENCOUNTER — Encounter: Payer: Self-pay | Admitting: Cardiology

## 2014-12-12 VITALS — BP 142/72 | HR 78 | Ht 69.0 in | Wt 209.0 lb

## 2014-12-12 DIAGNOSIS — I25810 Atherosclerosis of coronary artery bypass graft(s) without angina pectoris: Secondary | ICD-10-CM

## 2014-12-12 DIAGNOSIS — E1151 Type 2 diabetes mellitus with diabetic peripheral angiopathy without gangrene: Secondary | ICD-10-CM

## 2014-12-12 DIAGNOSIS — E785 Hyperlipidemia, unspecified: Secondary | ICD-10-CM | POA: Diagnosis not present

## 2014-12-12 DIAGNOSIS — E1159 Type 2 diabetes mellitus with other circulatory complications: Secondary | ICD-10-CM

## 2014-12-12 DIAGNOSIS — IMO0002 Reserved for concepts with insufficient information to code with codable children: Secondary | ICD-10-CM

## 2014-12-12 DIAGNOSIS — E1165 Type 2 diabetes mellitus with hyperglycemia: Secondary | ICD-10-CM

## 2014-12-12 NOTE — Patient Instructions (Signed)
**Note De-identified Keandrea Tapley Obfuscation** Medication Instructions:  Same  Labwork: None  Testing/Procedures: None  Follow-Up: Your physician wants you to follow-up in: 6 months with Dr Skains. You will receive a reminder letter in the mail two months in advance. If you don't receive a letter, please call our office to schedule the follow-up appointment.      

## 2014-12-12 NOTE — Progress Notes (Signed)
Cardiology Office Note   Date:  12/12/2014   ID:  [name] [name], DOB 25-Mar-1951, MRN [id number]  PCP:  [name], MD  Cardiologist:  Willa RoughJeffrey Jeaneane Adamec, MD   Chief Complaint  Patient presents with  . Appointment    Follow-up coronary disease and hyperlipidemia      History of Present Illness: [name] [name] is a 64 y.o. male who presents today to follow-up artery disease and hyperlipidemia. I saw him in March, 2016. At that time I was encouraging him to go on a more potent statin. He agreed to change from 20 of simvastatin to 20 of Lipitor. He tells me that he had nighttime leg cramps at first. This improved over time. He is stable now. His follow-up labs reveal an LDL of 56. TSH was normal. Unfortunately his triglycerides are high. I'm sure that goes along with his diabetes. He tells me that he is about to see an endocrinologist soon.    Past Medical History  Diagnosis Date  . Food poisoning     age 64  . Rash   . Hypertension   . Hyperlipidemia   . Diabetes mellitus   . CAD (coronary artery disease)     a. 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 b. s/p CABG 04/2011.  [name]. Aortic valve sclerosis     echo june 2009  . PVC (premature ventricular contraction) 06/2009    November, 2010  . GERD (gastroesophageal reflux disease)   . Muscle ache     on Crestor  09/04/09 CPK 193(7-232) tolerated simvastatin in the past  . Ejection fraction     a. Ejection fraction 60%, echo, June, 2009. b. EF preserved by cath 2012.  [name]. Hx of CABG     [name],  x4  May 01, 2011 limited to the LAD, left radial artery to the OM, SVG to diagonal, SVG to posterior descending  . Carotid artery disease     Doppler, preop, September, 2012,  mild bilateral plaque with no significant stenoses  . Dizziness     June, 2013    Past Surgical History  Procedure Laterality Date  . Coccyx bone removed    . Pilonidal cystectomy    . Back surgery    .  Intraoperative transesophageal echocardiography.  05/01/2011  . Coronary artery bypass graft  05/01/2011    CABG x4 with LIMA to LAD, LRA to OM, SVG to D1, SVG to PDA, EVH via left thigh    Patient Active Problem List   Diagnosis Date Noted  . Muscle ache     Priority: High  . Ejection fraction     Priority: High  . Carotid bruit 06/03/2009    Priority: High  . Dizziness   . Carotid artery disease   . Hx of CABG   . CAD (coronary artery disease)   . Hyperlipidemia   . Aortic valve sclerosis   . GERD (gastroesophageal reflux disease)   . KNEE PAIN, LEFT, CHRONIC 05/21/2010  . PVC (premature ventricular contraction) 06/03/2009  . Type II diabetes mellitus with peripheral circulatory disorder, uncontrolled 03/27/2008  . HYPERTENSION 03/27/2008      Current Outpatient Prescriptions  Medication Sig Dispense Refill  . aspirin 81 MG tablet Take 81 mg by mouth daily.      [name]. atorvastatin (LIPITOR) 20 MG tablet Take 1 tablet (20 mg total) by mouth daily. 30 tablet 3  . glucose blood test strip One touch ultra blue test  strip. Test blood sugar once daily. Dx code: 250.00 100 each 12  . insulin detemir (LEVEMIR) 100 UNIT/ML injection Inject 44 Units into the skin at bedtime.    [name]. NOVOFINE 32G X 6 MM MISC USE ONCE DAILY WITH LEVIMIR INJECTION 100 each 1  . Omega-3 Fatty Acids (FISH OIL) 500 MG CAPS Take 500 mg by mouth daily.    [name] Pate. ONETOUCH DELICA LANCETS 33G MISC Test blood sugar once daily. Dx code: 250.00 100 each 12  . ramipril (ALTACE) 10 MG capsule Take 1 capsule (10 mg total) by mouth daily. 90 capsule 2  . Saxagliptin-Metformin 2.12-998 MG TB24 Take 1 tablet by mouth 2 (two) times daily with a meal.     No current facility-administered medications for this visit.    Allergies:   Rosuvastatin    Social History:  The patient  reports that he has never smoked. He does not have any smokeless tobacco history on file. He reports that he does not drink alcohol or use illicit drugs.    Family History:  The patient's family history includes Cancer in his maternal grandfather; Diabetes in his father, mother, paternal aunt, and paternal uncle; Heart attack in his paternal grandfather; Heart attack (age of onset: 5965) in his father; Heart failure in his maternal grandmother; Hypertension in his father; Prostate cancer in his maternal grandfather. There is no history of Stroke.    ROS:  Please see the history of present illness.     Patient denies fever, chills, headache, sweats, rash, change in vision, change in hearing, chest pain, cough, nausea or vomiting, urinary symptoms. All other systems are reviewed and are negative.   PHYSICAL EXAM: VS:  BP 142/72 mmHg  Pulse 78  Ht 5\' 9"  (1.753 m)  Wt 209 lb (94.802 kg)  BMI 30.85 kg/m2 , Patient is overweight. He is oriented to person time and place. Affect is normal. Head is atraumatic. Sclera and conjunctiva are normal. There is no jugular venous distention. Lungs are clear. Respiratory effort is not labored. Cardiac exam reveals S1 and S2. The abdomen is soft. There is no peripheral edema. There are no musculoskeletal deformities. There are no skin rashes.  EKG:   EKG is not done today.   Recent Labs: 04/25/2014: ALT 26; Hemoglobin 14.2; Platelets 155.0 11/20/2014: BUN 13; Creatinine 0.65; Potassium 4.2; Sodium 135; TSH 2.30    Lipid Panel    Component Value Date/Time   CHOL 135 11/20/2014 0905   TRIG * 11/20/2014 0905    405.0 Triglyceride is over 400; calculations on Lipids are invalid.   HDL 31.70* 11/20/2014 0905   CHOLHDL 4 11/20/2014 0905   VLDL 32.4 04/25/2014 0957   LDLCALC 64 04/25/2014 0957   LDLDIRECT 56.0 11/20/2014 0905      Wt Readings from Last 3 Encounters:  12/12/14 209 lb (94.802 kg)  11/20/14 206 lb (93.441 kg)  10/24/14 210 lb (95.255 kg)      Current medicines are reviewed  The patient understands his medications.     ASSESSMENT AND PLAN:

## 2014-12-12 NOTE — Assessment & Plan Note (Signed)
Coronary disease is stable. I have increased his statin up to Lipitor 20. No further change at this time as this is a new dose increased for him.

## 2014-12-12 NOTE — Assessment & Plan Note (Signed)
His LDL is 56 on 20 of Lipitor. No further change at this time until we have a better handle on his diabetes through his other physicians.

## 2014-12-12 NOTE — Assessment & Plan Note (Signed)
The patient is soon to see endocrinology for his diabetes. It is very important that he gets better control. His LDL was under good control. However his triglycerides were quite elevated consistent with his diabetes.

## 2014-12-17 ENCOUNTER — Encounter: Payer: Self-pay | Admitting: Endocrinology

## 2014-12-17 ENCOUNTER — Ambulatory Visit (INDEPENDENT_AMBULATORY_CARE_PROVIDER_SITE_OTHER): Payer: BLUE CROSS/BLUE SHIELD | Admitting: Endocrinology

## 2014-12-17 VITALS — BP 130/80 | HR 72 | Temp 98.7°F | Resp 18 | Wt 201.0 lb

## 2014-12-17 DIAGNOSIS — E1165 Type 2 diabetes mellitus with hyperglycemia: Secondary | ICD-10-CM

## 2014-12-17 DIAGNOSIS — E782 Mixed hyperlipidemia: Secondary | ICD-10-CM

## 2014-12-17 DIAGNOSIS — E041 Nontoxic single thyroid nodule: Secondary | ICD-10-CM

## 2014-12-17 DIAGNOSIS — IMO0002 Reserved for concepts with insufficient information to code with codable children: Secondary | ICD-10-CM

## 2014-12-17 MED ORDER — ICOSAPENT ETHYL 1 G PO CAPS: ORAL_CAPSULE | ORAL | Status: DC

## 2014-12-17 NOTE — Patient Instructions (Signed)
Check blood sugars on waking up .5-6 . times a week Also check blood sugars about 2 hours after a meal and do this after different meals by rotation  Call readings on Friday  Recommended blood sugar levels on waking up is 90-130 and about 2 hours after meal is 140-180 Please bring blood sugar monitor to each visit.  LEVEMIR 20 in am and 40 at bedtime  Other insulin brands: Evaristo Buryresiba and Toujeo, check coverage

## 2014-12-17 NOTE — Progress Notes (Addendum)
Patient ID: [name] [name], male   DOB: 1951-03-17, 64 y.o.   MRN: [id number]           Reason for Appointment: Consultation for Type 2 Diabetes  Referring physician: Alwyn RenHopper  History of Present Illness:          Date of diagnosis of type 2 diabetes mellitus :        Background history:  He was probably started on metformin at diagnosis and subsequently given Amaryl also Subsequently had been on Janumet and more recently this [facility]. Because of poor control he was given Levemir in addition to his Kombiglyze in 01/2013 The dose was progressively increased from initial dose of 12 units He did have somewhat better control right after starting insulin with A1c below 7% but only one time  Recent history:   INSULIN regimen is described as: Levemir  44 units in am       His blood sugars have been consistently high since 2014 with A1c at least 8% He was told to take his insulin once a day at the same time and he is doing this in the morning He was requiring as much as 60 units at one time but because of the cost of insulin he reduced it to 44 units and has been taking the same dose for quite some time.  He is still concerned about the cost of the insulin  Current blood sugar patterns and problems identified:    he is checking blood sugars only in the mornings; however he does not have his own monitor and is checking the sporadically on his father's glucose monitor  His blood sugars are consistently over 200 at home in the morning  He may have some high fat meals at times and desires counseling with the nutritionist.  Has not seen one before He does get more fatigued and excessively thirsty when his blood sugars are higher Usually avoiding drinks with sugar and at the most may have lower sugar content juices  Oral hypoglycemic drugs the patient is taking are: Kombiglyze twice a day      Side effects from medications have been: None  Compliance with the medical  regimen:  Fair Hypoglycemia: None    Glucose monitoring:  done about once or less in a day         Glucometer: One Touch.      Blood Glucose readings by time of day: Fasting readings by recall are ranging from 240-342   Self-care: The diet that the patient has been following is: tries to limit carbs.     Meal times: Breakfast: 9 AM Lunch: 1 PM Dinner: 6 PM   Typical meal intake: Breakfast is steak biscuit.  Lunch is a sandwich or vegetables, variable meal at suppertime.  For snacks will have popcorn               Dietician visit, most recent: None               Exercise:  he is trying to walk at least 2 days a week and playing some golf.  Weight history: Highest rate previously 256  Wt Readings from Last 3 Encounters:  12/17/14 201 lb (91.173 kg)  12/12/14 209 lb (94.802 kg)  11/20/14 206 lb (93.441 kg)    Glycemic control:   Lab Results  Component Value Date   HGBA1C 9.2* 11/20/2014   HGBA1C 8.9* 08/08/2013   HGBA1C 9.2* 01/17/2013   Lab Results  Component Value Date   MICROALBUR 1.4 11/20/2014   LDLCALC 64 04/25/2014   CREATININE 0.65 11/20/2014         Medication List       This list is accurate as of: 12/17/14  1:16 PM.  Always use your most recent med list.               aspirin 81 MG tablet  Take 81 mg by mouth daily.     atorvastatin 20 MG tablet  Commonly known as:  LIPITOR  Take 1 tablet (20 mg total) by mouth daily.     Fish Oil 500 MG Caps  Take 500 mg by mouth daily.     glucose blood test strip  One touch ultra blue test strip. Test blood sugar once daily. Dx code: 250.00     Icosapent Ethyl 1 G Caps  Commonly known as:  VASCEPA  4 g daily     insulin detemir 100 UNIT/ML injection  Commonly known as:  LEVEMIR  Inject 44 Units into the skin every morning.     NOVOFINE 32G X 6 MM Misc  Generic drug:  Insulin Pen Needle  USE ONCE DAILY WITH LEVIMIR INJECTION     ONETOUCH DELICA LANCETS 33G Misc  Test blood sugar once daily. Dx code:  250.00     ramipril 10 MG capsule  Commonly known as:  ALTACE  Take 1 capsule (10 mg total) by mouth daily.     Saxagliptin-Metformin 2.12-998 MG Tb24  Take 1 tablet by mouth 2 (two) times daily with a meal.        Allergies:  Allergies  Allergen Reactions  . Rosuvastatin Other (See Comments)    REACTION: myalgias    Past Medical History  Diagnosis Date  . Food poisoning     age 64  . Rash   . Hypertension   . Hyperlipidemia   . Diabetes mellitus   . CAD (coronary artery disease)     a. 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 b. s/p CABG 04/2011.  [name]. Aortic valve sclerosis     echo june 2009  . PVC (premature ventricular contraction) 06/2009    November, 2010  . GERD (gastroesophageal reflux disease)   . Muscle ache     on Crestor  09/04/09 CPK 193(7-232) tolerated simvastatin in the past  . Ejection fraction     a. Ejection fraction 60%, echo, June, 2009. b. EF preserved by cath 2012.  [name]. Hx of CABG     [name],  x4  May 01, 2011 limited to the LAD, left radial artery to the OM, SVG to diagonal, SVG to posterior descending  . Carotid artery disease     Doppler, preop, September, 2012,  mild bilateral plaque with no significant stenoses  . Dizziness     June, 2013    Past Surgical History  Procedure Laterality Date  . Coccyx bone removed    . Pilonidal cystectomy    . Back surgery    . Intraoperative transesophageal echocardiography.  05/01/2011  . Coronary artery bypass graft  05/01/2011    CABG x4 with LIMA to LAD, LRA to OM, SVG to D1, SVG to PDA, EVH via left thigh    Family History  Problem Relation Age of Onset  . Hypertension Father   . Heart attack Father 65    bypass  . Diabetes Father   . Hearing loss Father   . Heart attack  Paternal Grandfather     late 10s  . Hearing loss Paternal Grandfather   . Cancer Maternal Grandfather     site unknown  . Prostate cancer Maternal Grandfather   . Diabetes  Paternal Aunt     X 3  . Heart failure Maternal Grandmother   . Diabetes Mother   . Diabetes Paternal Uncle   . Stroke Neg Hx     Social History:  reports that he has never smoked. He does not have any smokeless tobacco history on file. He reports that he does not drink alcohol or use illicit drugs.    Review of Systems    Lipid history:     Lab Results  Component Value Date   CHOL 135 11/20/2014   HDL 31.70* 11/20/2014   LDLCALC 64 04/25/2014   LDLDIRECT 56.0 11/20/2014   TRIG * 11/20/2014    405.0 Triglyceride is over 400; calculations on Lipids are invalid.   CHOLHDL 4 11/20/2014           Constitutional: no recent weight gain/loss.  Eyes: no history of blurred vision.  Most recent eye exam was in 2015, no known retinopathy  ENT: no nasal congestion, difficulty swallowing, no hoarseness   Cardiovascular: no chest pain or tightness on exertion.  No leg swelling.  Hypertension:  Respiratory: no cough/shortness of breath  Gastrointestinal: no constipation, diarrhea, nausea or abdominal pain  Musculoskeletal: no muscle/joint aches   Urological:   No frequency of urination or  Nocturia He denies any decreased libido or erectile dysfunction  Skin: no rash or infections  Neurological: no headaches.  Has no numbness, burning, pains or tingling in feet    Psychiatric: no symptoms of depression  Endocrine: No unusual fatigue, cold intolerance or history of thyroid disease   LABS:  No visits with results within 1 Week(s) from this visit. Latest known visit with results is:  Appointment on 11/20/2014  Component Date Value Ref Range Status  . Sodium 11/20/2014 135  135 - 145 mEq/L Final  . Potassium 11/20/2014 4.2  3.5 - 5.1 mEq/L Final  . Chloride 11/20/2014 102  96 - 112 mEq/L Final  . CO2 11/20/2014 26  19 - 32 mEq/L Final  . Glucose, Bld 11/20/2014 283* 70 - 99 mg/dL Final  . BUN 16/05/9603 13  6 - 23 mg/dL Final  . Creatinine, Ser 11/20/2014 0.65  0.40 -  1.50 mg/dL Final  . Calcium 54/04/8118 9.6  8.4 - 10.5 mg/dL Final  . GFR 14/78/2956 132.13  >60.00 mL/min Final  . Hgb A1c MFr Bld 11/20/2014 9.2* 4.6 - 6.5 % Final   Glycemic Control Guidelines for People with Diabetes:Non Diabetic:  <6%Goal of Therapy: <7%Additional Action Suggested:  >8%   . Microalb, Ur 11/20/2014 1.4  0.0 - 1.9 mg/dL Final  . Creatinine,U 21/30/8657 124.1   Final  . Microalb Creat Ratio 11/20/2014 1.1  0.0 - 30.0 mg/g Final  . Cholesterol 11/20/2014 135  0 - 200 mg/dL Final   ATP III Classification       Desirable:  < 200 mg/dL               Borderline High:  200 - 239 mg/dL          High:  > = 846 mg/dL  . Triglycerides 11/20/2014 405.0 Triglyceride is over 400; calculations on Lipids are invalid.* 0.0 - 149.0 mg/dL Final   Normal:  <962 mg/dLBorderline High:  150 - 199 mg/dL  . HDL 11/20/2014 31.70* >  39.00 mg/dL Final  . Total CHOL/HDL Ratio 11/20/2014 4   Final                  Men          Women1/2 Average Risk     3.4          3.3Average Risk          5.0          4.42X Average Risk          9.6          7.13X Average Risk          15.0          11.0                      . TSH 11/20/2014 2.30  0.35 - 4.50 uIU/mL Final  . Direct LDL 11/20/2014 56.0   Final   Optimal:  <100 mg/dLNear or Above Optimal:  100-129 mg/dLBorderline High:  130-159 mg/dLHigh:  160-189 mg/dLVery High:  >190 mg/dL    Physical Examination:  BP 130/80 mmHg  Pulse 72  Temp(Src) 98.7 F (37.1 C) (Oral)  Resp 18  Wt 201 lb (91.173 kg)  SpO2 95%  GENERAL:    he is well built and nourished, not significantly obese HEENT:         Eye exam shows normal external appearance. Fundus exam shows no retinopathy. Oral exam shows normal mucosa .  NECK:   There is no lymphadenopathy  Thyroid exam shows a soft to firm fleshy left medial lobe felt on swallowing, somewhat nodular in shape.  Right side is not palpable Carotids are normal to palpation and no bruit heard LUNGS:         Chest is  symmetrical. Lungs are clear to auscultation.[name]   HEART:         Heart sounds:  S1 and S2 are normal. No murmurs or clicks heard., no S3 or S4.   ABDOMEN:   There is no distention present. Liver and spleen are not palpable. No other mass or tenderness present.   NEUROLOGICAL:   Vibration sense is mildly reduced in distal first toes. Ankle jerks are absent bilaterally and biceps reflexes are 1+ bilaterally .          Normal monofilament sensation and pulses in the feet MUSCULOSKELETAL:  There is no swelling or deformity of the peripheral joints. Spine is normal to inspection.   EXTREMITIES:     There is no edema. No skin lesions present.[name] SKIN:       No rash or lesions of concern.        ASSESSMENT:  Diabetes type 2, uncontrolled with persistently high A1c    He is not significantly obese and does appear to be insulin deficient with requiring insulin for control since 2014 Currently has marked increase in blood sugars in the mornings with taking Levemir in the morning This may be partly related to inadequate duration of action of Levemir; however not clear what his postprandial readings are since he does not check readings after meals at all Not clear if he requires mealtime insulin and this will be determined by his starting to check readings after meals He does not have a glucose monitor to check his blood sugar at home for himself Also can do better with diet with lower fat meals especially at breakfast and has not had any formal diabetes education  Complications: None  evident  HYPERLIPIDEMIA: He has marked increase in triglycerides which may be partly related to poor diabetes control.  However he is not going to be controlled with a statin drug and will add prescription fish oil  THYROID nodule: Appears to have left-sided thyroid nodule and will schedule ultrasound to evaluate further.  Discussed basic information on thyroid nodules  TSH normal recently  HYPERTENSION: Blood pressure is  well controlled with ramipril   PLAN:   Started using One Touch Verio glucose monitor  Discussed timing of glucose monitoring and blood sugar targets.  As a trial will have him take Levemir twice a day for the next few days to see if his blood sugars are improving; if his postprandial readings are not much higher he can continue on basal insulin alone otherwise add mealtime doses.  Discussed basic principles of normal insulin action and need for shorter acting mealtime dose eventually  He will check the cost of Toujeo and Tresiba since these will be longer lasting and will probably be less expensive for him  Also will schedule him with dietitian for meal planning  May take both tablets of Kombiglyze together for convenience  Start Vascepa 4 g daily for hypertriglyceridemia.  Discussed how this works and co-pay card given    Patient Instructions  Check blood sugars on waking up .5-6 . times a week Also check blood sugars about 2 hours after a meal and do this after different meals by rotation  Call readings on Friday  Recommended blood sugar levels on waking up is 90-130 and about 2 hours after meal is 140-180 Please bring blood sugar monitor to each visit.  LEVEMIR 20 in am and 40 at bedtime  Other insulin brands: Evaristo Bury and Toujeo, check coverage        Counseling time on subjects discussed above is over 50% of today's 60 minute visit  Limmie Schoenberg 12/17/2014, 1:16 PM   Note: This office note was prepared with Insurance underwriter. Any transcriptional errors that result from this process are unintentional.  Addendum: Has 2.4 cm dominant left thyroid nodule and will discuss biopsy on the next visit

## 2014-12-21 ENCOUNTER — Telehealth: Payer: Self-pay | Admitting: Endocrinology

## 2014-12-21 NOTE — Telephone Encounter (Signed)
Pt called in with the following BS readings.  5/20 - 9:29 am - 265 5/19 - 8:57 am - 227 5/18 - 7:37 am - 327 5/17 - 8:17 pm - 268 (2 hours after a meal) 5/17 - 9:53 am - 273 (fasting)

## 2014-12-21 NOTE — Telephone Encounter (Signed)
Increase Levemir in pm to 55

## 2014-12-24 ENCOUNTER — Other Ambulatory Visit: Payer: Self-pay | Admitting: Endocrinology

## 2014-12-24 MED ORDER — GLUCOSE BLOOD VI STRP: ORAL_STRIP | Status: DC

## 2014-12-24 MED ORDER — ONETOUCH DELICA LANCETS 33G MISC: Status: DC

## 2014-12-26 ENCOUNTER — Ambulatory Visit
Admission: RE | Admit: 2014-12-26 | Discharge: 2014-12-26 | Disposition: A | Discharge status: Home / Self Care | Payer: BLUE CROSS/BLUE SHIELD | Source: Ambulatory Visit | Attending: Endocrinology | Admitting: Endocrinology

## 2014-12-26 DIAGNOSIS — E041 Nontoxic single thyroid nodule: Secondary | ICD-10-CM

## 2015-01-03 ENCOUNTER — Encounter: Payer: BLUE CROSS/BLUE SHIELD | Attending: Endocrinology | Admitting: Dietician

## 2015-01-03 ENCOUNTER — Encounter: Payer: Self-pay | Admitting: Dietician

## 2015-01-03 VITALS — Ht 69.0 in | Wt 207.0 lb

## 2015-01-03 DIAGNOSIS — E1165 Type 2 diabetes mellitus with hyperglycemia: Secondary | ICD-10-CM

## 2015-01-03 DIAGNOSIS — E1159 Type 2 diabetes mellitus with other circulatory complications: Secondary | ICD-10-CM | POA: Diagnosis present

## 2015-01-03 DIAGNOSIS — IMO0002 Reserved for concepts with insufficient information to code with codable children: Secondary | ICD-10-CM

## 2015-01-03 DIAGNOSIS — E1151 Type 2 diabetes mellitus with diabetic peripheral angiopathy without gangrene: Secondary | ICD-10-CM

## 2015-01-03 DIAGNOSIS — Z794 Long term (current) use of insulin: Secondary | ICD-10-CM | POA: Insufficient documentation

## 2015-01-03 DIAGNOSIS — Z713 Dietary counseling and surveillance: Secondary | ICD-10-CM | POA: Diagnosis not present

## 2015-01-03 NOTE — Progress Notes (Signed)
  Medical Nutrition Therapy:  Appt start time: 1330 end time:  1500.   Assessment:  Primary concerns today: Patient is here alone and would like to learn to better control his diabetes and reduce his medications.  HgbA1C 9.2% 11/20/14 and has hovered around this number since 2014.  Hx includes CABG 3 years ago, HTN, and hyperlipidemia.  Triglicerides 405, Chol 135, HDL 31.  He checks his blood sugar 2-3 times a day.  Fasting and after meals.  The number is usually >200.    Patient is married and lives with wife half the time and parents half the time due to work.  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  Both shop.  Wife cooks occasionally.   Patient mostly eats out.  Overall diet is high in fat.  Preferred Learning Style:   No preference indicated   Learning Readiness:   Ready   MEDICATIONS: see list to include Levemir and saxgliptin-metformin   DIETARY INTAKE: Usually eats out bid. 24-hr recall:  B (9:30 AM): [employer] King Croissant (sausage, egg, and cheese) and diet soda today OR western omelet and 2 strips bacon, 1 slice of plain [employer] toast  Snk ( AM): popcorn OR NABS ORcinnamon roll or sugar free cinnamon treat  L ( PM): 6" tuna sub with baked chips at times OR [employer]FC chicken, potatoes diet drink Snk ( PM): similar to am if he has one. D ( PM): pizza, wings and salad at [employer] OR vegetable plate OR cornbread and buttermilk or Salmon stew Snk ( PM): popcorn or Pretzels Beverages: diet soda, unsweetened tea, water, flavored water  Usual physical activity: walks, 20-45 minutes minimally most days. Does his own lawn maintenance.    Estimated energy needs: 2000 calories 225 g carbohydrates 125 g protein 67 g fat  Progress Towards Goal(s):  In progress.   Nutritional Diagnosis:  NB-1.1 Food and nutrition-related knowledge deficit As related to balance of carbohydrates, protein, and fat.  As evidenced by diet hx.     Intervention:  Nutrition counseling and diabetes education initiated. Discussed Carb Counting by food group as method of portion control, reading food labels, and benefits of increased activity. Also discussed basic physiology of Diabetes, target BG ranges pre and post meals, and A1c.    Exercise for 30 minutes most days of the week.  Be as active as possible. Be mindful of portion sizes. Choose baked, or grilled more often than fried.  Be mindful of the fat content in foods when eating out. Consider cooking at home more often. Aim for 3-4 Carb Choices per meal (45-60 grams) +/- 1 either way  Aim for 0-2 Carbs per snack if hungry  Before snacking ask "Am I hungry?" Include protein in moderation with your meals and snacks Consider reading food labels for Total Carbohydrate and Fat Grams of foods  Teaching Method Utilized:  Visual Auditory Hands on  Handouts given during visit include:  Meal plan card  Snack list  Label reading  HgbA1C sheet  Barriers to learning/adherence to lifestyle change: increased eating out  Demonstrated degree of understanding via:  Teach Back   Monitoring/Evaluation:  Dietary intake, exercise, label reading, and body weight prn.

## 2015-01-09 ENCOUNTER — Ambulatory Visit (INDEPENDENT_AMBULATORY_CARE_PROVIDER_SITE_OTHER): Payer: BLUE CROSS/BLUE SHIELD | Admitting: Endocrinology

## 2015-01-09 ENCOUNTER — Encounter: Payer: Self-pay | Admitting: Endocrinology

## 2015-01-09 VITALS — BP 118/62 | HR 73 | Temp 98.3°F | Resp 16 | Ht 69.0 in | Wt 208.8 lb

## 2015-01-09 DIAGNOSIS — E041 Nontoxic single thyroid nodule: Secondary | ICD-10-CM | POA: Diagnosis not present

## 2015-01-09 DIAGNOSIS — E1165 Type 2 diabetes mellitus with hyperglycemia: Secondary | ICD-10-CM | POA: Diagnosis not present

## 2015-01-09 DIAGNOSIS — IMO0002 Reserved for concepts with insufficient information to code with codable children: Secondary | ICD-10-CM

## 2015-01-09 MED ORDER — CANAGLIFLOZIN 100 MG PO TABS: ORAL_TABLET | ORAL | Status: DC

## 2015-01-09 MED ORDER — RAMIPRIL 5 MG PO CAPS: 5.0000 mg | ORAL_CAPSULE | Freq: Every day | ORAL | Status: DC

## 2015-01-09 NOTE — Patient Instructions (Signed)
Invokana 100mg  before Bfst daily  Levemir 70 units at night and 30 in am until am sugar <100 then reduce to previous dose  Check coverage for Tresiba and TOUjeo  Stay on Kombiglyze for now  Check blood sugars on waking up .[name].4-5  .[name]. times a week Also check blood sugars about 2 hours after a meal and do this after different meals by rotation Recommended blood sugar levels on waking up is 90-130 and about 2 hours after meal is 140-180 Please bring blood sugar monitor to each visit.

## 2015-01-09 NOTE — Progress Notes (Signed)
Patient ID: [name] [name], male   DOB: 09/17/1950, 64 y.o.   MRN: [id number]           Reason for Appointment: Follow-up for Type 2 Diabetes  Referring physician: Alwyn Ren  History of Present Illness:          Date of diagnosis of type 2 diabetes mellitus :        Background history:  He was probably started on metformin at diagnosis and subsequently given Amaryl also Subsequently had been on Janumet and more recently this was changed to [facility]. Because of poor control he was given Levemir in addition to his Kombiglyze in 01/2013 The dose was progressively increased from initial dose of 12 units He did have somewhat better control right after starting insulin with A1c below 7% but only one time His blood sugars have been consistently high since 2014 with A1c at least 8%  Recent history:   INSULIN regimen is described as: Levemir  55 units at night and  20 in am       He was told to increase his Levemir insulin on the last visit when he was taking relatively small dose of 44 units instead of the prescribed 60 units He also takes Kombiglyze EID He was also advised to start checking more readings after meals instead of just in the mornings; also was given a new glucose monitor to use He has been fairly compliant with his insulin dose He was asked to check on the cost of Toujeo and [name] but he has not done so He was seen by the dietitian last week and was told to cut back on portions and high fat intake  Current blood sugar patterns and problems identified:    he is checking blood sugars mostly in the mornings; these are still significantly high and still consistently over 200  Has a couple of readings in the evenings which are over 200 and highest reading was 367 at 9 PM  Oral hypoglycemic drugs the patient is taking are: Kombiglyze twice a day      Side effects from medications have been: None  Compliance with the medical regimen:  Fair Hypoglycemia: None    Glucose  monitoring:  done about once in a day         Glucometer: One Touch.      Blood Glucose readings by time of day:   Mean values apply above for all meters except median for One Touch  PRE-MEAL Fasting Lunch Dinner Bedtime Overall  Glucose range:  2 21-332     233-367    Mean/median:      260    Self-care: The diet that the patient has been following is: tries to limit carbs.     Meal times: Breakfast: 9 AM Lunch: 1 PM Dinner: 6 PM   Typical meal intake: Breakfast is steak biscuit.  Lunch is a sandwich or vegetables, variable meal at suppertime.  For snacks will have popcorn               Dietician visit, most recent: 6/16               Exercise:  he is trying to walk at least 2 days a week and playing some golf.  Weight history: Highest  previously 256  Wt Readings from Last 3 Encounters:  01/09/15 208 lb 12.8 oz (94.711 kg)  01/03/15 207 lb (93.895 kg)  12/17/14 201 lb (91.173 kg)    Glycemic control:  Lab Results  Component Value Date   HGBA1C 9.2* 11/20/2014   HGBA1C 8.9* 08/08/2013   HGBA1C 9.2* 01/17/2013   Lab Results  Component Value Date   MICROALBUR 1.4 11/20/2014   LDLCALC 64 04/25/2014   CREATININE 0.65 11/20/2014         Medication List       This list is accurate as of: 01/09/15  9:29 PM.  Always use your most recent med list.               aspirin 81 MG tablet  Take 81 mg by mouth daily.     atorvastatin 20 MG tablet  Commonly known as:  LIPITOR  Take 1 tablet (20 mg total) by mouth daily.     glucose blood test strip  One touch ultra blue test strip. Test blood sugar once daily. Dx code:E11.09.     Icosapent Ethyl 1 G Caps  Commonly known as:  VASCEPA  4 g daily     insulin detemir 100 UNIT/ML injection  Commonly known as:  LEVEMIR  Inject into the skin 2 (two) times daily. 55 units at night and 20 units in am     NOVOFINE 32G X 6 MM Misc  Generic drug:  Insulin Pen Needle  USE ONCE DAILY WITH LEVIMIR INJECTION     ONETOUCH DELICA  LANCETS 33G Misc  Test blood sugar once daily. Dx code: E11.09     ramipril 10 MG capsule  Commonly known as:  ALTACE  Take 1 capsule (10 mg total) by mouth daily.     Saxagliptin-Metformin 2.12-998 MG Tb24  Take 1 tablet by mouth 2 (two) times daily with a meal.        Allergies:  Allergies  Allergen Reactions  . Rosuvastatin Other (See Comments)    REACTION: myalgias    Past Medical History  Diagnosis Date  . Food poisoning     age 42  . Rash   . Hypertension   . Hyperlipidemia   . Diabetes mellitus   . CAD (coronary artery disease)     a. 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 b. s/p CABG 04/2011.  [name] Aortic valve sclerosis     echo june 2009  . PVC (premature ventricular contraction) 06/2009    November, 2010  . GERD (gastroesophageal reflux disease)   . Muscle ache     on Crestor  09/04/09 CPK 193(7-232) tolerated simvastatin in the past  . Ejection fraction     a. Ejection fraction 60%, echo, June, 2009. b. EF preserved by cath 2012.  [name] Hx of CABG     [name],  x4  May 01, 2011 limited to the LAD, left radial artery to the OM, SVG to diagonal, SVG to posterior descending  . Carotid artery disease     Doppler, preop, September, 2012,  mild bilateral plaque with no significant stenoses  . Dizziness     June, 2013    Past Surgical History  Procedure Laterality Date  . Coccyx bone removed    . Pilonidal cystectomy    . Back surgery    . Intraoperative transesophageal echocardiography.  05/01/2011  . Coronary artery bypass graft  05/01/2011    CABG x4 with LIMA to LAD, LRA to OM, SVG to D1, SVG to PDA, EVH via left thigh    Family History  Problem Relation Age of Onset  . Hypertension Father   . Heart attack Father 37  bypass  . Diabetes Father   . Hearing loss Father   . Heart attack Paternal Grandfather     late 67s  . Hearing loss Paternal Grandfather   . Cancer Maternal Grandfather     site unknown  .  Prostate cancer Maternal Grandfather   . Diabetes Paternal Aunt     X 3  . Heart failure Maternal Grandmother   . Diabetes Mother   . Diabetes Paternal Uncle   . Stroke Neg Hx     Social History:  reports that he has never smoked. He does not have any smokeless tobacco history on file. He reports that he does not drink alcohol or use illicit drugs.    Review of Systems    Lipid history: He has been started on Vascepa for significantly high triglycerides along with his Lipitor    Lab Results  Component Value Date   CHOL 135 11/20/2014   HDL 31.70* 11/20/2014   LDLCALC 64 04/25/2014   LDLDIRECT 56.0 11/20/2014   TRIG * 11/20/2014    405.0 Triglyceride is over 400; calculations on Lipids are invalid.   CHOLHDL 4 11/20/2014            Endocrine: He has a 2.4 cm dominant left-sided thyroid nodule on recent ultrasound   LABS:  No visits with results within 1 Week(s) from this visit. Latest known visit with results is:  Appointment on 11/20/2014  Component Date Value Ref Range Status  . Sodium 11/20/2014 135  135 - 145 mEq/L Final  . Potassium 11/20/2014 4.2  3.5 - 5.1 mEq/L Final  . Chloride 11/20/2014 102  96 - 112 mEq/L Final  . CO2 11/20/2014 26  19 - 32 mEq/L Final  . Glucose, Bld 11/20/2014 283* 70 - 99 mg/dL Final  . BUN 16/05/9603 13  6 - 23 mg/dL Final  . Creatinine, Ser 11/20/2014 0.65  0.40 - 1.50 mg/dL Final  . Calcium 54/04/8118 9.6  8.4 - 10.5 mg/dL Final  . GFR 14/78/2956 132.13  >60.00 mL/min Final  . Hgb A1c MFr Bld 11/20/2014 9.2* 4.6 - 6.5 % Final   Glycemic Control Guidelines for People with Diabetes:Non Diabetic:  <6%Goal of Therapy: <7%Additional Action Suggested:  >8%   . Microalb, Ur 11/20/2014 1.4  0.0 - 1.9 mg/dL Final  . Creatinine,U 21/30/8657 124.1   Final  . Microalb Creat Ratio 11/20/2014 1.1  0.0 - 30.0 mg/g Final  . Cholesterol 11/20/2014 135  0 - 200 mg/dL Final   ATP III Classification       Desirable:  < 200 mg/dL                Borderline High:  200 - 239 mg/dL          High:  > = 846 mg/dL  . Triglycerides 11/20/2014 405.0 Triglyceride is over 400; calculations on Lipids are invalid.* 0.0 - 149.0 mg/dL Final   Normal:  <962 mg/dLBorderline High:  150 - 199 mg/dL  . HDL 11/20/2014 31.70* >39.00 mg/dL Final  . Total CHOL/HDL Ratio 11/20/2014 4   Final                  Men          Women1/2 Average Risk     3.4          3.3Average Risk          5.0          4.42X Average Risk  9.6          7.13X Average Risk          15.0          11.0                      . TSH 11/20/2014 2.30  0.35 - 4.50 uIU/mL Final  . Direct LDL 11/20/2014 56.0   Final   Optimal:  <100 mg/dLNear or Above Optimal:  100-129 mg/dLBorderline High:  130-159 mg/dLHigh:  160-189 mg/dLVery High:  >190 mg/dL    Physical Examination:  BP 118/62 mmHg  Pulse 73  Temp(Src) 98.3 F (36.8 C)  Resp 16  Ht 5\' 9"  (1.753 m)  Wt 208 lb 12.8 oz (94.711 kg)  BMI 30.82 kg/m2  SpO2 97%     ASSESSMENT:  Diabetes type 2, uncontrolled with persistently high A1c    He is markedly insulin resistant and not responding to taking 75 units of basal insulin along with Kombiglyze His average blood sugar at home is about 250 although he is checking blood sugars mostly in the mornings Although he is active with exercise and recently trying to cut back on high fat and caloric intake with discussion with dietitian and his blood sugars have not improved much  He is a good candidate for adding a drug like Invokana Discussed action of SGLT 2 drugs on lowering glucose by decreasing kidney absorption of glucose, benefits of weight loss and lower blood pressure, possible side effects including candidiasis and dosage regimen   HYPERLIPIDEMIA: He has been started on Vascepa and will need follow-up fasting lipids which may also improve with improved diabetes control  THYROID nodule: Appears to have left-sided thyroid nodule and will schedule biopsy, discussed implications  of dominant thyroid nodules and procedure for biopsy  HYPERTENSION: Blood pressure is well controlled with ramipril but will reduce the dose to 5 mg since he is starting Invokana   PLAN:   Discussed timing of glucose monitoring to include postprandial blood sugars more often  Start 100 mg Invokana as discussed above  Increase insulin further for now  Check into the  cost of Tresiba and Toujeo as these will be more effective than Levemir  Follow instructions given by dietitian for meal planning  If he has better blood sugars with Invokana he can probably combine this with metformin, he will call blood sugar readings in another 10 days    Patient Instructions  Invokana 100mg  before Bfst daily  Levemir 70 units at night and 30 in am until am sugar <100 then reduce to previous dose  Check coverage for Tresiba and TOUjeo  Stay on Kombiglyze for now  Check blood sugars on waking up .[name].4-5  .[name]. times a week Also check blood sugars about 2 hours after a meal and do this after different meals by rotation Recommended blood sugar levels on waking up is 90-130 and about 2 hours after meal is 140-180 Please bring blood sugar monitor to each visit.    Counseling time on subjects discussed above is over 50% of today's 60 minute visit  [name] [name] 01/09/2015, 9:29 PM   Note: This office note was prepared with Insurance underwriterDragon voice recognition system technology. Any transcriptional errors that result from this process are unintentional.

## 2015-01-13 ENCOUNTER — Other Ambulatory Visit: Payer: Self-pay | Admitting: Physician Assistant

## 2015-01-14 ENCOUNTER — Other Ambulatory Visit: Payer: Self-pay | Admitting: Internal Medicine

## 2015-01-14 ENCOUNTER — Other Ambulatory Visit: Payer: Self-pay | Admitting: *Deleted

## 2015-01-14 MED ORDER — INSULIN DETEMIR 100 UNIT/ML FLEXPEN: PEN_INJECTOR | SUBCUTANEOUS | Status: DC

## 2015-01-14 NOTE — Telephone Encounter (Signed)
levemir rx sent to pharm

## 2015-02-11 ENCOUNTER — Ambulatory Visit (INDEPENDENT_AMBULATORY_CARE_PROVIDER_SITE_OTHER): Payer: BLUE CROSS/BLUE SHIELD | Admitting: Endocrinology

## 2015-02-11 ENCOUNTER — Encounter: Payer: Self-pay | Admitting: Endocrinology

## 2015-02-11 VITALS — BP 140/82 | HR 72 | Temp 98.3°F | Resp 14 | Ht 67.5 in | Wt 207.4 lb

## 2015-02-11 DIAGNOSIS — IMO0002 Reserved for concepts with insufficient information to code with codable children: Secondary | ICD-10-CM

## 2015-02-11 DIAGNOSIS — E1165 Type 2 diabetes mellitus with hyperglycemia: Secondary | ICD-10-CM | POA: Diagnosis not present

## 2015-02-11 DIAGNOSIS — E782 Mixed hyperlipidemia: Secondary | ICD-10-CM | POA: Diagnosis not present

## 2015-02-11 NOTE — Patient Instructions (Signed)
Check blood sugars on waking up ..3  .. times a week Also check blood sugars about 2 hours after a meal and do this after different meals by rotation  Recommended blood sugar levels on waking up is 90-130 and about 2 hours after meal is 140-180 Please bring blood sugar monitor to each visit.  Check cost of [place] and [employer]

## 2015-02-11 NOTE — Progress Notes (Signed)
Patient ID: [name] [name], male   DOB: Jul 26, 1951, 64 y.o.   MRN: [id number]           Reason for Appointment: Follow-up for Type 2 Diabetes  Referring physician: Alwyn Ren  History of Present Illness:          Date of diagnosis of type 2 diabetes mellitus :        Background history:  He was probably started on metformin at diagnosis and subsequently given Amaryl also Subsequently had been on Janumet and more recently this was changed to [facility]. Because of poor control he was given Levemir in addition to his Kombiglyze in 01/2013 The dose was progressively increased from initial dose of 12 units He did have somewhat better control right after starting insulin with A1c below 7% but only one time His blood sugars have been consistently high since 2014 with A1c at least 8%  Recent history:   INSULIN regimen is described as: Levemir  65 units at night and  25 in am       He was given a trial of Invokana since 01/2015 because of his persistently high blood sugars including fasting despite taking relatively large insulin doses and Kombiglyze XR Although he did not bring his blood sugar monitor for download he thinks his blood sugars are relatively improving  Current blood sugar patterns and problems identified:    again he is checking blood sugars mostly in the mornings; has not done any after meals recently.  He thinks his fasting blood sugars in the last few days have been down close to 120-130  He thinks his readings after meals may have been higher but not clear what the levels are, likely not checking these at all; previously a highest reading was 367  He has tolerated Invokana and has only mild increased urination during the day  He is doing some exercise but not able to lose weight, however  glucose levels appear to be improving  Since the sugars were coming down he reduced his Levemir on his own by 5 units twice a day despite no hypoglycemia  Oral hypoglycemic drugs the  patient is taking are: Kombiglyze twice a day, Invokana 100 mg daily      Side effects from medications have been: None  Compliance with the medical regimen:  Fair Hypoglycemia: None    Glucose monitoring:  done about once in a day         Glucometer: One Touch.      Blood Glucose readings by recall   PRE-MEAL Fasting Lunch Dinner Bedtime Overall  Glucose range: 125-130   ?   Mean/median:         Self-care: The diet that the patient has been following is: tries to limit carbs.     Meal times: Breakfast: 9 AM Lunch: 1 PM Dinner: 6 PM   Typical meal intake: Breakfast is steak biscuit.  Lunch is a sandwich or vegetables, variable meal at suppertime.  For snacks will have popcorn               Dietician visit, most recent: 6/16               Exercise:  he is trying to walk at least 2 days a week and playing some golf.  Weight history: Highest  previously 256  Wt Readings from Last 3 Encounters:  02/11/15 207 lb 6.4 oz (94.076 kg)  01/09/15 208 lb 12.8 oz (94.711 kg)  01/03/15 207 lb (93.895  kg)    Glycemic control:   Lab Results  Component Value Date   HGBA1C 9.2* 11/20/2014   HGBA1C 8.9* 08/08/2013   HGBA1C 9.2* 01/17/2013   Lab Results  Component Value Date   MICROALBUR 1.4 11/20/2014   LDLCALC 64 04/25/2014   CREATININE 0.65 11/20/2014         Medication List       This list is accurate as of: 02/11/15  9:34 PM.  Always use your most recent med list.               aspirin 81 MG tablet  Take 81 mg by mouth daily.     atorvastatin 20 MG tablet  Commonly known as:  LIPITOR  Take 1 tablet (20 mg total) by mouth daily.     canagliflozin 100 MG Tabs tablet  Commonly known as:  INVOKANA  1 tablet before breakfast     glucose blood test strip  One touch ultra blue test strip. Test blood sugar once daily. Dx code:E11.09.     Icosapent Ethyl 1 G Caps  Commonly known as:  VASCEPA  4 g daily     Insulin Detemir 100 UNIT/ML Pen  Commonly known as:   LEVEMIR FLEXTOUCH  Inject  70 units every am and 30 units every pm     NOVOFINE 32G X 6 MM Misc  Generic drug:  Insulin Pen Needle  USE ONCE DAILY WITH LEVIMIR INJECTION     ONETOUCH DELICA LANCETS 33G Misc  Test blood sugar once daily. Dx code: E11.09     ramipril 5 MG capsule  Commonly known as:  ALTACE  Take 1 capsule (5 mg total) by mouth daily.     Saxagliptin-Metformin 2.12-998 MG Tb24  Take 1 tablet by mouth 2 (two) times daily with a meal.        Allergies:  Allergies  Allergen Reactions  . Rosuvastatin Other (See Comments)    REACTION: myalgias    Past Medical History  Diagnosis Date  . Food poisoning     age 31  . Rash   . Hypertension   . Hyperlipidemia   . Diabetes mellitus   . CAD (coronary artery disease)     a. 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 b. s/p CABG 04/2011.  [name] Aortic valve sclerosis     echo june 2009  . PVC (premature ventricular contraction) 06/2009    November, 2010  . GERD (gastroesophageal reflux disease)   . Muscle ache     on Crestor  09/04/09 CPK 193(7-232) tolerated simvastatin in the past  . Ejection fraction     a. Ejection fraction 60%, echo, June, 2009. b. EF preserved by cath 2012.  [name] Hx of CABG     [name],  x4  May 01, 2011 limited to the LAD, left radial artery to the OM, SVG to diagonal, SVG to posterior descending  . Carotid artery disease     Doppler, preop, September, 2012,  mild bilateral plaque with no significant stenoses  . Dizziness     June, 2013    Past Surgical History  Procedure Laterality Date  . Coccyx bone removed    . Pilonidal cystectomy    . Back surgery    . Intraoperative transesophageal echocardiography.  05/01/2011  . Coronary artery bypass graft  05/01/2011    CABG x4 with LIMA to LAD, LRA to OM, SVG to D1, SVG to PDA, EVH via left thigh  Family History  Problem Relation Age of Onset  . Hypertension Father   . Heart attack Father 65     bypass  . Diabetes Father   . Hearing loss Father   . Heart attack Paternal Grandfather     late 4260s  . Hearing loss Paternal Grandfather   . Cancer Maternal Grandfather     site unknown  . Prostate cancer Maternal Grandfather   . Diabetes Paternal Aunt     X 3  . Heart failure Maternal Grandmother   . Diabetes Mother   . Diabetes Paternal Uncle   . Stroke Neg Hx     Social History:  reports that he has never smoked. He does not have any smokeless tobacco history on file. He reports that he does not drink alcohol or use illicit drugs.    Review of Systems    Lipid history: He has been started on Vascepa for significantly high triglycerides along with his Lipitor    Lab Results  Component Value Date   CHOL 135 11/20/2014   HDL 31.70* 11/20/2014   LDLCALC 64 04/25/2014   LDLDIRECT 56.0 11/20/2014   TRIG * 11/20/2014    405.0 Triglyceride is over 400; calculations on Lipids are invalid.   CHOLHDL 4 11/20/2014            Endocrine: He has a 2.4 cm dominant left-sided thyroid nodule on  ultrasound   LABS:  No visits with results within 1 Week(s) from this visit. Latest known visit with results is:  Appointment on 11/20/2014  Component Date Value Ref Range Status  . Sodium 11/20/2014 135  135 - 145 mEq/L Final  . Potassium 11/20/2014 4.2  3.5 - 5.1 mEq/L Final  . Chloride 11/20/2014 102  96 - 112 mEq/L Final  . CO2 11/20/2014 26  19 - 32 mEq/L Final  . Glucose, Bld 11/20/2014 283* 70 - 99 mg/dL Final  . BUN 16/10/960404/19/2016 13  6 - 23 mg/dL Final  . Creatinine, Ser 11/20/2014 0.65  0.40 - 1.50 mg/dL Final  . Calcium 54/09/811904/19/2016 9.6  8.4 - 10.5 mg/dL Final  . GFR 14/78/295604/19/2016 132.13  >60.00 mL/min Final  . Hgb A1c MFr Bld 11/20/2014 9.2* 4.6 - 6.5 % Final   Glycemic Control Guidelines for People with Diabetes:Non Diabetic:  <6%Goal of Therapy: <7%Additional Action Suggested:  >8%   . Microalb, Ur 11/20/2014 1.4  0.0 - 1.9 mg/dL Final  . Creatinine,U 21/30/865704/19/2016 124.1   Final   . Microalb Creat Ratio 11/20/2014 1.1  0.0 - 30.0 mg/g Final  . Cholesterol 11/20/2014 135  0 - 200 mg/dL Final   ATP III Classification       Desirable:  < 200 mg/dL               Borderline High:  200 - 239 mg/dL          High:  > = 846240 mg/dL  . Triglycerides 11/20/2014 405.0 Triglyceride is over 400; calculations on Lipids are invalid.* 0.0 - 149.0 mg/dL Final   Normal:  <962<150 mg/dLBorderline High:  150 - 199 mg/dL  . HDL 11/20/2014 31.70* >39.00 mg/dL Final  . Total CHOL/HDL Ratio 11/20/2014 4   Final                  Men          Women1/2 Average Risk     3.4          3.3Average Risk  5.0          4.42X Average Risk          9.6          7.13X Average Risk          15.0          11.0                      . TSH 11/20/2014 2.30  0.35 - 4.50 uIU/mL Final  . Direct LDL 11/20/2014 56.0   Final   Optimal:  <100 mg/dLNear or Above Optimal:  100-129 mg/dLBorderline High:  130-159 mg/dLHigh:  160-189 mg/dLVery High:  >190 mg/dL    Physical Examination:  BP 140/82 mmHg  Pulse 72  Temp(Src) 98.3 F (36.8 C)  Resp 14  Ht 5' 7.5" (1.715 m)  Wt 207 lb 6.4 oz (94.076 kg)  BMI 31.99 kg/m2  SpO2 97%     ASSESSMENT:  Diabetes type 2, uncontrolled with persistently high A1c    He is insulin resistant and now appears to be responding significantly to adding Invokana or even though does not have any Labs to objectively assess his current level of control However not clear if his postprandial readings are high as he does not monitor these much Weight is about the same despite starting Invokana but his blood sugars may be improving also  HYPERLIPIDEMIA: He has been  on Vascepa and will need follow-up fasting lipids which may also improve with improved diabetes control  THYROID nodule: Biopsy scheduled on 7/20  HYPERTENSION: Blood pressure is well controlled with ramipril which was reduced to 5 mg  PLAN:   Discussed timing of glucose monitoring to include postprandial blood sugars and  to bring monitor on the next visit    continue 100 mg Invokana as blood sugars may be adequately controlled    no further increase in insulin as yet  Consider mealtime insulin if postprandial readings are consistently high  Check into the  cost of Tresiba and Toujeo as these will be more effective than Levemir and will avoid twice a day dosing    he will call blood sugar readings in another 10 days   Patient Instructions  Check blood sugars on waking up ..3  .. times a week Also check blood sugars about 2 hours after a meal and do this after different meals by rotation  Recommended blood sugar levels on waking up is 90-130 and about 2 hours after meal is 140-180 Please bring blood sugar monitor to each visit.  Check cost of Evaristo Bury and Toujeo      Counseling time on subjects discussed above is over 50% of today's 60 minute visit  Saori Umholtz 02/11/2015, 9:34 PM   Note: This office note was prepared with Insurance underwriter. Any transcriptional errors that result from this process are unintentional.

## 2015-02-20 ENCOUNTER — Other Ambulatory Visit: Payer: BLUE CROSS/BLUE SHIELD

## 2015-02-26 ENCOUNTER — Telehealth: Payer: Self-pay

## 2015-02-26 ENCOUNTER — Other Ambulatory Visit: Payer: Self-pay | Admitting: Cardiology

## 2015-02-26 NOTE — Telephone Encounter (Signed)
Please see below and advise.

## 2015-02-26 NOTE — Telephone Encounter (Signed)
He will have to call insurance to check coverage for Janumet and [employer]

## 2015-02-26 NOTE — Telephone Encounter (Signed)
Pt called stating the Kombiglyze is too expensive. The copayment is going to be 190$. Pt stated he did not know of a possible alterative medication that is his insurance would approve.

## 2015-02-27 ENCOUNTER — Other Ambulatory Visit: Payer: Self-pay | Admitting: *Deleted

## 2015-02-27 MED ORDER — SITAGLIP PHOS-METFORMIN HCL ER 50-1000 MG PO TB24: ORAL_TABLET | ORAL | Status: DC

## 2015-02-27 NOTE — Telephone Encounter (Signed)
rx sent

## 2015-02-27 NOTE — Telephone Encounter (Signed)
Please see below.

## 2015-02-27 NOTE — Telephone Encounter (Signed)
Pt states Mirian Capuchin is too expensive, he attempted to call his insurance, the rep took his info and said they would call him back but did not please advise

## 2015-02-27 NOTE — Telephone Encounter (Signed)
Try sending Janumet XR 50/1000, 2 tablets daily

## 2015-03-18 ENCOUNTER — Other Ambulatory Visit: Payer: BLUE CROSS/BLUE SHIELD

## 2015-03-25 ENCOUNTER — Ambulatory Visit: Payer: BLUE CROSS/BLUE SHIELD | Admitting: Endocrinology

## 2015-03-26 ENCOUNTER — Telehealth: Payer: Self-pay | Admitting: Endocrinology

## 2015-03-26 NOTE — Telephone Encounter (Signed)
Patient said he will call the insurance and find out today and call us back tomorrow and let us know.

## 2015-03-26 NOTE — Telephone Encounter (Signed)
Patient stated that he need another alternative to the Insulin Detemir (LEVEMIR FLEXTOUCH) 100 UNIT/ML Pen, because it is to expensive, please advise

## 2015-03-26 NOTE — Telephone Encounter (Signed)
He needs to find out from his insurance what will be better covered

## 2015-03-26 NOTE — Telephone Encounter (Signed)
Please see below.

## 2015-04-15 ENCOUNTER — Other Ambulatory Visit: Payer: BLUE CROSS/BLUE SHIELD

## 2015-04-15 ENCOUNTER — Other Ambulatory Visit (INDEPENDENT_AMBULATORY_CARE_PROVIDER_SITE_OTHER): Payer: BLUE CROSS/BLUE SHIELD

## 2015-04-15 DIAGNOSIS — E1165 Type 2 diabetes mellitus with hyperglycemia: Secondary | ICD-10-CM | POA: Diagnosis not present

## 2015-04-15 DIAGNOSIS — IMO0002 Reserved for concepts with insufficient information to code with codable children: Secondary | ICD-10-CM

## 2015-04-15 LAB — COMPREHENSIVE METABOLIC PANEL
ALBUMIN: 4.3 g/dL (ref 3.5–5.2)
ALK PHOS: 38 U/L — AB (ref 39–117)
ALT: 20 U/L (ref 0–53)
AST: 12 U/L (ref 0–37)
BUN: 14 mg/dL (ref 6–23)
CHLORIDE: 106 meq/L (ref 96–112)
CO2: 24 mEq/L (ref 19–32)
Calcium: 9.2 mg/dL (ref 8.4–10.5)
Creatinine, Ser: 0.66 mg/dL (ref 0.40–1.50)
GFR: 129.65 mL/min (ref >60.00)
Glucose, Bld: 206 mg/dL — ABNORMAL HIGH (ref 70–99)
POTASSIUM: 4.4 meq/L (ref 3.5–5.1)
Sodium: 139 mEq/L (ref 135–145)
TOTAL PROTEIN: 7.1 g/dL (ref 6.0–8.3)
Total Bilirubin: 0.5 mg/dL (ref 0.2–1.2)

## 2015-04-15 LAB — LIPID PANEL
CHOLESTEROL: 123 mg/dL (ref 0–200)
HDL: 28 mg/dL — AB (ref >39.00)
TRIGLYCERIDES: 544 mg/dL — AB (ref 0.0–149.0)
Total CHOL/HDL Ratio: 4

## 2015-04-15 LAB — HEMOGLOBIN A1C: Hgb A1c MFr Bld: 7.6 % — ABNORMAL HIGH (ref 4.6–6.5)

## 2015-04-15 LAB — LDL CHOLESTEROL, DIRECT: Direct LDL: 57 mg/dL

## 2015-04-22 ENCOUNTER — Ambulatory Visit (INDEPENDENT_AMBULATORY_CARE_PROVIDER_SITE_OTHER): Payer: BLUE CROSS/BLUE SHIELD | Admitting: Endocrinology

## 2015-04-22 ENCOUNTER — Other Ambulatory Visit: Payer: Self-pay

## 2015-04-22 ENCOUNTER — Encounter: Payer: Self-pay | Admitting: Endocrinology

## 2015-04-22 VITALS — BP 122/78 | HR 76 | Temp 98.7°F | Resp 16 | Ht 69.0 in | Wt 208.0 lb

## 2015-04-22 DIAGNOSIS — E041 Nontoxic single thyroid nodule: Secondary | ICD-10-CM

## 2015-04-22 DIAGNOSIS — E782 Mixed hyperlipidemia: Secondary | ICD-10-CM | POA: Diagnosis not present

## 2015-04-22 DIAGNOSIS — E1165 Type 2 diabetes mellitus with hyperglycemia: Secondary | ICD-10-CM

## 2015-04-22 DIAGNOSIS — IMO0002 Reserved for concepts with insufficient information to code with codable children: Secondary | ICD-10-CM

## 2015-04-22 MED ORDER — RAMIPRIL 2.5 MG PO CAPS: 2.5000 mg | ORAL_CAPSULE | Freq: Every day | ORAL | Status: DC

## 2015-04-22 MED ORDER — CANAGLIFLOZIN 300 MG PO TABS: 300.0000 mg | ORAL_TABLET | Freq: Every day | ORAL | Status: DC

## 2015-04-22 NOTE — Progress Notes (Signed)
Patient ID: [name] [name], male   DOB: [birth year], 64 y.o.   MRN: [id number]           Reason for Appointment: Follow-up for Type 2 Diabetes  Referring physician: Alwyn Ren  History of Present Illness:          Date of diagnosis of type 2 diabetes mellitus :        Background history:  He was probably started on metformin at diagnosis and subsequently given Amaryl also Subsequently had been on Janumet and more recently this was changed to [facility]. Because of poor control he was given Levemir in addition to his Kombiglyze in 01/2013 The dose was progressively increased from initial dose of 12 units He did have somewhat better control right after starting insulin with A1c below 7% but only one time His blood sugars have been consistently high since 2014 with A1c at least 8%  Recent history:   INSULIN regimen is described as: Levemir 46 bid      He was given a trial of Invokana since 01/2015 because of his persistently high blood sugars including fasting despite taking relatively large insulin doses and Kombiglyze XR.  He is now taking Janumet XR instead of Kombiglyze XR because of the insurance preference His blood sugars are not well controlled with significantly high fasting readings recently although his A1c appears somewhat better 7.6  Current blood sugar patterns and problems identified:    Recently is checking blood sugars mostly in the mornings; these are averaging about 190  Because of his not being able to afford his Levemir insulin he has cut back his dosage in the last few days; also he is supposed to be taking relatively larger doses in the evening but is not doing so.  Has no readings after lunch and supper  Also concerned about the cost of Janumet  He has tolerated Invokana and has only been taking 100 mg recently  Has not been able to lose weight,  this is despite his talking to the dietitian who felt that he had a very high fat intake and he is still not able to  make many changes  Oral hypoglycemic drugs the patient is taking are: Janumet twice a day, Invokana 100 mg daily      Side effects from medications have been: None  Compliance with the medical regimen:  Fair Hypoglycemia: None    Glucose monitoring:  done less than once  in a day         Glucometer: One Touch.      Blood Glucose readings from download: Fasting 182-233 Around lunchtime: 242, 287  Self-care: The diet that the patient has been following is: tries to limit carbs.     Meal times: Breakfast: 9 AM Lunch: 1 PM Dinner: 6 PM   Typical meal intake: Breakfast is steak biscuit.  Lunch is a sandwich or vegetables, variable meal at suppertime.  For snacks will have popcorn               Dietician visit, most recent: 6/16               Exercise:  he is trying to walk at  2 days a week and playing some golf.  Weight history: Highest  previously 256  Wt Readings from Last 3 Encounters:  04/22/15 208 lb (94.348 kg)  02/11/15 207 lb 6.4 oz (94.076 kg)  01/09/15 208 lb 12.8 oz (94.711 kg)    Glycemic control:   Lab Results  Component Value Date   HGBA1C 7.6* 04/15/2015   HGBA1C 9.2* 11/20/2014   HGBA1C 8.9* 08/08/2013   Lab Results  Component Value Date   MICROALBUR 1.4 11/20/2014   LDLCALC 64 04/25/2014   CREATININE 0.66 04/15/2015         Medication List       This list is accurate as of: 04/22/15  9:15 PM.  Always use your most recent med list.               aspirin 81 MG tablet  Take 81 mg by mouth daily.     atorvastatin 20 MG tablet  Commonly known as:  LIPITOR  TAKE 1 TABLET (20 MG TOTAL) BY MOUTH DAILY.     canagliflozin 100 MG Tabs tablet  Commonly known as:  INVOKANA  1 tablet before breakfast     canagliflozin 300 MG Tabs tablet  Commonly known as:  INVOKANA  Take 300 mg by mouth daily before breakfast.     glucose blood test strip  One touch ultra blue test strip. Test blood sugar once daily. Dx code:E11.09.     Icosapent Ethyl 1 G Caps    Commonly known as:  VASCEPA  4 g daily     Insulin Detemir 100 UNIT/ML Pen  Commonly known as:  LEVEMIR FLEXTOUCH  Inject  70 units every am and 30 units every pm     NOVOFINE 32G X 6 MM Misc  Generic drug:  Insulin Pen Needle  USE ONCE DAILY WITH LEVIMIR INJECTION     ONETOUCH DELICA LANCETS 33G Misc  Test blood sugar once daily. Dx code: E11.09     ramipril 5 MG capsule  Commonly known as:  ALTACE  Take 1 capsule (5 mg total) by mouth daily.     ramipril 2.5 MG capsule  Commonly known as:  ALTACE  Take 1 capsule (2.5 mg total) by mouth daily.     SitaGLIPtin-MetFORMIN HCl 50-1000 MG Tb24  Commonly known as:  JANUMET XR  Take 2 tablets daily        Allergies:  Allergies  Allergen Reactions  . Rosuvastatin Other (See Comments)    REACTION: myalgias    Past Medical History  Diagnosis Date  . Food poisoning     age 69  . Rash   . Hypertension   . Hyperlipidemia   . Diabetes mellitus   . CAD (coronary artery disease)     a. 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 b. s/p CABG 04/2011.  [name] Aortic valve sclerosis     echo june 2009  . PVC (premature ventricular contraction) 06/2009    November, 2010  . GERD (gastroesophageal reflux disease)   . Muscle ache     on Crestor  09/04/09 CPK 193(7-232) tolerated simvastatin in the past  . Ejection fraction     a. Ejection fraction 60%, echo, June, 2009. b. EF preserved by cath 2012.  [name] Hx of CABG     [name],  x4  May 01, 2011 limited to the LAD, left radial artery to the OM, SVG to diagonal, SVG to posterior descending  . Carotid artery disease     Doppler, preop, September, 2012,  mild bilateral plaque with no significant stenoses  . Dizziness     June, 2013    Past Surgical History  Procedure Laterality Date  . Coccyx bone removed    . Pilonidal cystectomy    . Back surgery    .  Intraoperative transesophageal echocardiography.  05/01/2011  . Coronary artery bypass  graft  05/01/2011    CABG x4 with LIMA to LAD, LRA to OM, SVG to D1, SVG to PDA, EVH via left thigh    Family History  Problem Relation Age of Onset  . Hypertension Father   . Heart attack Father 65    bypass  . Diabetes Father   . Hearing loss Father   . Heart attack Paternal Grandfather     late 79s  . Hearing loss Paternal Grandfather   . Cancer Maternal Grandfather     site unknown  . Prostate cancer Maternal Grandfather   . Diabetes Paternal Aunt     X 3  . Heart failure Maternal Grandmother   . Diabetes Mother   . Diabetes Paternal Uncle   . Stroke Neg Hx     Social History:  reports that he has never smoked. He does not have any smokeless tobacco history on file. He reports that he does not drink alcohol or use illicit drugs.    Review of Systems    Lipid history: He has been started on Vascepa for significantly high triglycerides along with his Lipitor but apparently is not taking this recently because of cost and triglycerides are much higher     Lab Results  Component Value Date   CHOL 123 04/15/2015   HDL 28.00* 04/15/2015   LDLCALC 64 04/25/2014   LDLDIRECT 57.0 04/15/2015   TRIG 544.0* 04/15/2015   CHOLHDL 4 04/15/2015            Endocrine: He has a 2.4 cm dominant left-sided thyroid nodule on  Ultrasound but he still has not gone for his biopsy that was ordered    LABS:  No visits with results within 1 Week(s) from this visit. Latest known visit with results is:  Appointment on 04/15/2015  Component Date Value Ref Range Status  . Hgb A1c MFr Bld 04/15/2015 7.6* 4.6 - 6.5 % Final   Glycemic Control Guidelines for People with Diabetes:Non Diabetic:  <6%Goal of Therapy: <7%Additional Action Suggested:  >8%   . Sodium 04/15/2015 139  135 - 145 mEq/L Final  . Potassium 04/15/2015 4.4  3.5 - 5.1 mEq/L Final  . Chloride 04/15/2015 106  96 - 112 mEq/L Final  . CO2 04/15/2015 24  19 - 32 mEq/L Final  . Glucose, Bld 04/15/2015 206* 70 - 99 mg/dL Final   . BUN 16/05/9603 14  6 - 23 mg/dL Final  . Creatinine, Ser 04/15/2015 0.66  0.40 - 1.50 mg/dL Final  . Total Bilirubin 04/15/2015 0.5  0.2 - 1.2 mg/dL Final  . Alkaline Phosphatase 04/15/2015 38* 39 - 117 U/L Final  . AST 04/15/2015 12  0 - 37 U/L Final  . ALT 04/15/2015 20  0 - 53 U/L Final  . Total Protein 04/15/2015 7.1  6.0 - 8.3 g/dL Final  . Albumin 54/04/8118 4.3  3.5 - 5.2 g/dL Final  . Calcium 14/78/2956 9.2  8.4 - 10.5 mg/dL Final  . GFR 21/30/8657 129.65  >60.00 mL/min Final  . Cholesterol 04/15/2015 123  0 - 200 mg/dL Final   ATP III Classification       Desirable:  < 200 mg/dL               Borderline High:  200 - 239 mg/dL          High:  > = 846 mg/dL  . Triglycerides 04/15/2015 544.0* 0.0 - 149.0 mg/dL Final  Normal:  <150 mg/dLBorderline High:  150 - 199 mg/dLTriglyceride is over 400; calculations on Lipids are invalid.  [name] HDL 04/15/2015 28.00* >39.00 mg/dL Final  . Total CHOL/HDL Ratio 04/15/2015 4   Final                  Men          Women1/2 Average Risk     3.4          3.3Average Risk          5.0          4.42X Average Risk          9.6          7.13X Average Risk          15.0          11.0                      . Direct LDL 04/15/2015 57.0   Final   Optimal:  <100 mg/dLNear or Above Optimal:  100-129 mg/dLBorderline High:  130-159 mg/dLHigh:  160-189 mg/dLVery High:  >190 mg/dL    Physical Examination:  BP 122/78 mmHg  Pulse 76  Temp(Src) 98.7 F (37.1 C) (Oral)  Resp 16  Ht  (1.753 m)  Wt 208 lb (94.348 kg)  BMI 30.70 kg/m2  SpO2 96%     ASSESSMENT:  Diabetes type 2, uncontrolled with persistently high A1c    He is insulin resistant and  still requiring relatively large amounts of insulin even with taking Invokana and Janumet XR Although his A1c has improved his blood sugars are recently higher as he cannot afford his Levemir insulin Discussed various insulin options; he is reluctant to take insulin with a syringe and will not take generic  insulin  He is probably having higher fasting readings but not clear if he also has hyperglycemia after his lunch and dinner but clearly with his continuing to have a high fat meal  now appears to be responding significantly to adding Invokana or even though does not have any Labs to objectively assess his current level of control However not clear if his postprandial readings are high as he does not monitor these much Weight is about the same despite starting Invokana but his blood sugars may be improving also  HYPERLIPIDEMIA: He has higher levels and discussed needing to be better with his higher fat intake.  He also has difficulty taking his medications regularly because of cost   THYROID nodule: Biopsy needs to be rescheduled   HYPERTENSION: Blood pressure is well controlled with ramipril    PLAN:   Increase Invokana to 300 mg  Start checking blood sugars after meals   Check into the  cost of Tresiba and Toujeo as these will be more effective than Levemir and will avoid twice a day dosing, he will call back after he determines the cost and given co-pay cards for both     improve diet with low fat intake  May consider OTC fish oil instead of Vascepa if cost is a problem  Reduce ramipril to 2.5 mg while increasing Invokana  Also may consider using a V-go pump if he is reluctant to consider brand-name insulin   Patient Instructions  Check blood sugars on waking up .[name]3-4  .[name] times a week Also check blood sugars about 2 hours after a meal and do this after different meals by rotation  Recommended blood sugar levels  on waking up is 90-130 and about 2 hours after meal is 140-180 Please bring blood sugar monitor to each visit.  Toujeo or [name] 1 injection of 100 units at nite and keep am sugar <140 with 5   Must reduce fat in diet  Invokana 2 daily then next rx will be 300   Ramipril will be 2.5     Counseling time on subjects discussed above is over 50% of today's 60  minute visit  KUMAR,AJAY 04/22/2015, 9:15 PM   Note: This office note was prepared with Insurance underwriter. Any transcriptional errors that result from this process are unintentional.

## 2015-04-22 NOTE — Patient Instructions (Addendum)
Check blood sugars on waking up .[name]3-4  .[name] times a week Also check blood sugars about 2 hours after a meal and do this after different meals by rotation  Recommended blood sugar levels on waking up is 90-130 and about 2 hours after meal is 140-180 Please bring blood sugar monitor to each visit.  Toujeo or [name] 1 injection of 100 units at nite and keep am sugar <140 with 5   Must reduce fat in diet  Invokana 2 daily then next rx will be 300   Ramipril will be 2.5

## 2015-04-25 ENCOUNTER — Telehealth: Payer: Self-pay

## 2015-04-25 NOTE — Telephone Encounter (Signed)
Called pt in reference to the staff message sent by Dr.Kumar about importance of thyroid biopsy. Pt stated that he has cancelled these appts due to the insurance provider not covering the cost. Pt stated that he will not be getting this done until a later date.

## 2015-05-12 ENCOUNTER — Other Ambulatory Visit: Payer: Self-pay | Admitting: Endocrinology

## 2015-05-30 ENCOUNTER — Other Ambulatory Visit: Payer: Self-pay | Admitting: *Deleted

## 2015-05-30 MED ORDER — INSULIN DETEMIR 100 UNIT/ML FLEXPEN: PEN_INJECTOR | SUBCUTANEOUS | Status: DC

## 2015-06-03 ENCOUNTER — Encounter: Payer: Self-pay | Admitting: Endocrinology

## 2015-06-03 ENCOUNTER — Ambulatory Visit (INDEPENDENT_AMBULATORY_CARE_PROVIDER_SITE_OTHER): Payer: BLUE CROSS/BLUE SHIELD | Admitting: Endocrinology

## 2015-06-03 ENCOUNTER — Other Ambulatory Visit: Payer: Self-pay

## 2015-06-03 ENCOUNTER — Other Ambulatory Visit: Payer: Self-pay | Admitting: Endocrinology

## 2015-06-03 VITALS — BP 132/70 | HR 79 | Temp 98.1°F | Wt 206.6 lb

## 2015-06-03 DIAGNOSIS — E041 Nontoxic single thyroid nodule: Secondary | ICD-10-CM | POA: Diagnosis not present

## 2015-06-03 DIAGNOSIS — E1165 Type 2 diabetes mellitus with hyperglycemia: Secondary | ICD-10-CM | POA: Diagnosis not present

## 2015-06-03 DIAGNOSIS — Z794 Long term (current) use of insulin: Secondary | ICD-10-CM | POA: Diagnosis not present

## 2015-06-03 MED ORDER — INSULIN DEGLUDEC 200 UNIT/ML ~~LOC~~ SOPN: 110.0000 [IU] | PEN_INJECTOR | Freq: Once | SUBCUTANEOUS | Status: DC

## 2015-06-03 MED ORDER — CANAGLIFLOZIN 300 MG PO TABS: 300.0000 mg | ORAL_TABLET | Freq: Every day | ORAL | Status: DC

## 2015-06-03 NOTE — Progress Notes (Signed)
Patient ID: [name] [name], male   DOB: [birth year], 64 y.o.   MRN: [id number]           Reason for Appointment: Follow-up for Type 2 Diabetes  Referring physician: Alwyn Ren  History of Present Illness:          Date of diagnosis of type 2 diabetes mellitus :        Background history:  He was probably started on metformin at diagnosis and subsequently given Amaryl also Subsequently had been on Janumet and more recently this was changed to [facility]. Because of poor control he was given Levemir in addition to his Kombiglyze in 01/2013 The dose was progressively increased from initial dose of 12 units He did have somewhat better control right after starting insulin with A1c below 7% but only one time His blood sugars have been consistently high since 2014 with A1c at least 8%  Recent history:   INSULIN regimen is described as: Levemir 100  units at bedtime   He was given a trial of Invokana since 01/2015 because of his persistently high blood sugars including fasting despite taking relatively large insulin doses and Janumet XR    Also on his last visit he was asked to try getting Toujeo or Tresiba from his insurance company but he could not get this information from them. Also he was told to increase the Invokana to 300 mg  His blood sugars are  Still not well controlled with significantly high fasting readings recently     In September his A1c appears somewhat better 7.6  Current blood sugar patterns and problems identified:    Recently is checking blood sugars  Infrequently despite reminders to check more often   His fasting blood sugars are still about the same as his last visit   he does not know whether he went up on his dose of Invokana to 300 mg and also has not been taking it regularly  He has only a couple of readings in the evenings recently done yesterday after an Early meal which was 247 and later 188  His exercise has been much less recently because of busy  schedule  No recent labs are available to objectively assess his control  Has not been able to lose weight,  this is despite his talking to the dietitian who felt that he had a very high fat intake and he is still not able to make many changes  Oral hypoglycemic drugs the patient is taking are: Janumet twice a day, Invokana 100 mg daily      Side effects from medications have been: None  Compliance with the medical regimen:  Fair Hypoglycemia: None    Glucose monitoring:  done less than once  in a day         Glucometer: One Touch.      Blood Glucose readings from download:   Mean values apply above for all meters except median for One Touch  PRE-MEAL Fasting Lunch pc Dinner Bedtime Overall  Glucose range: 179-250  247 188   Mean/median:          Self-care: The diet that the patient has been following is: tries to limit carbs.     Meal times: Breakfast: 9 AM Lunch: 1 PM Dinner: 6 PM   Typical meal intake: Breakfast is steak biscuit.  Lunch is a sandwich or vegetables, variable meal at suppertime.  For snacks will have popcorn  Dietician visit, most recent: 6/16               Exercise:  he is not able to find time to walk, sometimes is playing some golf.  Weight history: Highest  previously 256  Wt Readings from Last 3 Encounters:  06/03/15 206 lb 9 oz (93.696 kg)  04/22/15 208 lb (94.348 kg)  02/11/15 207 lb 6.4 oz (94.076 kg)    Glycemic control:   Lab Results  Component Value Date   HGBA1C 7.6* 04/15/2015   HGBA1C 9.2* 11/20/2014   HGBA1C 8.9* 08/08/2013   Lab Results  Component Value Date   MICROALBUR 1.4 11/20/2014   LDLCALC 64 04/25/2014   CREATININE 0.66 04/15/2015         Medication List       This list is accurate as of: 06/03/15  9:00 PM.  Always use your most recent med list.               aspirin 81 MG tablet  Take 81 mg by mouth daily.     atorvastatin 20 MG tablet  Commonly known as:  LIPITOR  TAKE 1 TABLET (20 MG TOTAL)  BY MOUTH DAILY.     canagliflozin 300 MG Tabs tablet  Commonly known as:  INVOKANA  Take 300 mg by mouth daily before breakfast.     glucose blood test strip  One touch ultra blue test strip. Test blood sugar once daily. Dx code:E11.09.     Icosapent Ethyl 1 G Caps  Commonly known as:  VASCEPA  4 g daily     Insulin Degludec 200 UNIT/ML Sopn  Commonly known as:  TRESIBA FLEXTOUCH  Inject 110 Units into the skin once.     Insulin Detemir 100 UNIT/ML Pen  Commonly known as:  LEVEMIR FLEXTOUCH  Inject  65 units every am and 25 units every pm     NOVOFINE 32G X 6 MM Misc  Generic drug:  Insulin Pen Needle  USE ONCE DAILY WITH LEVIMIR INJECTION     ONETOUCH DELICA LANCETS 33G Misc  Test blood sugar once daily. Dx code: E11.09     ramipril 2.5 MG capsule  Commonly known as:  ALTACE  Take 1 capsule (2.5 mg total) by mouth daily.     SitaGLIPtin-MetFORMIN HCl 50-1000 MG Tb24  Commonly known as:  JANUMET XR  Take 2 tablets daily        Allergies:  Allergies  Allergen Reactions  . Rosuvastatin Other (See Comments)    REACTION: myalgias    Past Medical History  Diagnosis Date  . Food poisoning     age 64  . Rash   . Hypertension   . Hyperlipidemia   . Diabetes mellitus   . CAD (coronary artery disease)     a. 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 b. s/p CABG 04/2011.  [name]. Aortic valve sclerosis     echo june 2009  . PVC (premature ventricular contraction) 06/2009    November, 2010  . GERD (gastroesophageal reflux disease)   . Muscle ache     on Crestor  09/04/09 CPK 193(7-232) tolerated simvastatin in the past  . Ejection fraction     a. Ejection fraction 60%, echo, June, 2009. b. EF preserved by cath 2012.  [name]. Hx of CABG     [name] [name],  x4  May 01, 2011 limited to the LAD, left radial artery to the OM, SVG to diagonal, SVG to  posterior descending  . Carotid artery disease (HCC)     Doppler, preop, September, 2012,  mild  bilateral plaque with no significant stenoses  . Dizziness     June, 2013    Past Surgical History  Procedure Laterality Date  . Coccyx bone removed    . Pilonidal cystectomy    . Back surgery    . Intraoperative transesophageal echocardiography.  05/01/2011  . Coronary artery bypass graft  05/01/2011    CABG x4 with LIMA to LAD, LRA to OM, SVG to D1, SVG to PDA, EVH via left thigh    Family History  Problem Relation Age of Onset  . Hypertension Father   . Heart attack Father 65    bypass  . Diabetes Father   . Hearing loss Father   . Heart attack Paternal Grandfather     late 45s  . Hearing loss Paternal Grandfather   . Cancer Maternal Grandfather     site unknown  . Prostate cancer Maternal Grandfather   . Diabetes Paternal Aunt     X 3  . Heart failure Maternal Grandmother   . Diabetes Mother   . Diabetes Paternal Uncle   . Stroke Neg Hx     Social History:  reports that he has never smoked. He does not have any smokeless tobacco history on file. He reports that he does not drink alcohol or use illicit drugs.    Review of Systems    Lipid history: He has been started on Vascepa for significantly high triglycerides along with his Lipitor but apparently is not taking this recently because of cost and triglycerides are much higher     Lab Results  Component Value Date   CHOL 123 04/15/2015   HDL 28.00* 04/15/2015   LDLCALC 64 04/25/2014   LDLDIRECT 57.0 04/15/2015   TRIG 544.0* 04/15/2015   CHOLHDL 4 04/15/2015            Endocrine: He has a 2.4 cm dominant left-sided thyroid nodule on  Ultrasound but he still has not gone for his biopsy that was ordered    LABS:  No visits with results within 1 Week(s) from this visit. Latest known visit with results is:  Appointment on 04/15/2015  Component Date Value Ref Range Status  . Hgb A1c MFr Bld 04/15/2015 7.6* 4.6 - 6.5 % Final   Glycemic Control Guidelines for People with Diabetes:Non Diabetic:  <6%Goal of  Therapy: <7%Additional Action Suggested:  >8%   . Sodium 04/15/2015 139  135 - 145 mEq/L Final  . Potassium 04/15/2015 4.4  3.5 - 5.1 mEq/L Final  . Chloride 04/15/2015 106  96 - 112 mEq/L Final  . CO2 04/15/2015 24  19 - 32 mEq/L Final  . Glucose, Bld 04/15/2015 206* 70 - 99 mg/dL Final  . BUN 16/05/9603 14  6 - 23 mg/dL Final  . Creatinine, Ser 04/15/2015 0.66  0.40 - 1.50 mg/dL Final  . Total Bilirubin 04/15/2015 0.5  0.2 - 1.2 mg/dL Final  . Alkaline Phosphatase 04/15/2015 38* 39 - 117 U/L Final  . AST 04/15/2015 12  0 - 37 U/L Final  . ALT 04/15/2015 20  0 - 53 U/L Final  . Total Protein 04/15/2015 7.1  6.0 - 8.3 g/dL Final  . Albumin 54/04/8118 4.3  3.5 - 5.2 g/dL Final  . Calcium 14/78/2956 9.2  8.4 - 10.5 mg/dL Final  . GFR 21/30/8657 129.65  >60.00 mL/min Final  . Cholesterol 04/15/2015 123  0 - 200  mg/dL Final   ATP III Classification       Desirable:  < 200 mg/dL               Borderline High:  200 - 239 mg/dL          High:  > = 161 mg/dL  . Triglycerides 04/15/2015 544.0* 0.0 - 149.0 mg/dL Final   Normal:  <096 mg/dLBorderline High:  150 - 199 mg/dLTriglyceride is over 400; calculations on Lipids are invalid.  [name] HDL 04/15/2015 28.00* >39.00 mg/dL Final  . Total CHOL/HDL Ratio 04/15/2015 4   Final                  Men          Women1/2 Average Risk     3.4          3.3Average Risk          5.0          4.42X Average Risk          9.6          7.13X Average Risk          15.0          11.0                      . Direct LDL 04/15/2015 57.0   Final   Optimal:  <100 mg/dLNear or Above Optimal:  100-129 mg/dLBorderline High:  130-159 mg/dLHigh:  160-189 mg/dLVery High:  >190 mg/dL    Physical Examination:  BP 132/70 mmHg  Pulse 79  Temp(Src) 98.1 F (36.7 C) (Oral)  Wt 206 lb 9 oz (93.696 kg)  SpO2 94%     ASSESSMENT:  Diabetes type 2, uncontrolled with persistently high A1c    See history of present illness for detailed discussion of his current management, blood sugar  patterns and problems identified  He is insulin resistant and  still requiring relatively large amounts of insulin even with taking Invokana and Janumet XR Difficult to assess his postprandial hyperglycemia since he is noncompliant been monitoring after meals except yesterday Currently appears to have relatively high fasting readings and not much higher after meals  He is a good candidate for using U-500 insulin but his insurance does not cover it Also he has been somewhat irregular with his Invokana and not sure if he is taking the 300 mg prescribed on the last visit He has also not exercise much Diet has been relatively high fat and he has difficulty changing his lifestyle  HYPERLIPIDEMIA: He has inadequate control and he probably has not taken his fish oil.  This was previously denied by [employer] and he will check on whether he has it at home  THYROID nodule: Biopsy needs to be rescheduled and he will let us know when he is able to afford this   HYPERTENSION: Blood pressure is well controlled with ramipril, even with reducing the dose to 2.5, benefiting from Invokana    PLAN:   Increase Invokana to 300 mg  Start checking blood sugars after meals consistently  Since his insurance appears to be covering [name] he will try to get this at the drugstore  Given him a flowsheet to work on increasing his insulin by 5 units every 3 days until fasting blood sugar is below 130  He can start with 100 units of Tresiba in the evening  Consider mealtime insulin if blood sugars are high after certain  meals  Restart walking even if it is in the parking lot  No change in ramipril 2.5 as yet  Patient Instructions  Levemir 115 units till finished  New Rx Tresiba 100 U at [employer]  More sugars after meals   Make sure Invokana is 300mg  daily  Check blood sugars on waking up 3-4  times a week Also check blood sugars about 2 hours after a meal and do this after different meals by  rotation  Recommended blood sugar levels on waking up is 90-130 and about 2 hours after meal is 130-160  Please bring your blood sugar monitor to each visit, thank you  Walk more      Counseling time on subjects discussed above is over 50% of today's 60 minute visit  Kazden Largo 06/03/2015, 9:00 PM   Note: This office note was prepared with Insurance underwriter. Any transcriptional errors that result from this process are unintentional.

## 2015-06-03 NOTE — Patient Instructions (Addendum)
Levemir 115 units till finished  [facility] Tresiba [facility] [employer]  More sugars after meals   Make sure Theodis Satonvokana is 300mg  daily  Check blood sugars on waking up 3-4  times a week Also check blood sugars about 2 hours after a meal and do this after different meals by rotation  Recommended blood sugar levels on waking up is 90-130 and about 2 hours after meal is 130-160  Please bring your blood sugar monitor to each visit, thank you  Walk more

## 2015-06-12 ENCOUNTER — Ambulatory Visit (INDEPENDENT_AMBULATORY_CARE_PROVIDER_SITE_OTHER): Payer: BLUE CROSS/BLUE SHIELD | Admitting: Cardiology

## 2015-06-12 ENCOUNTER — Encounter: Payer: Self-pay | Admitting: Cardiology

## 2015-06-12 VITALS — BP 140/82 | HR 70 | Ht 69.0 in | Wt 208.8 lb

## 2015-06-12 DIAGNOSIS — IMO0002 Reserved for concepts with insufficient information to code with codable children: Secondary | ICD-10-CM

## 2015-06-12 DIAGNOSIS — I2510 Atherosclerotic heart disease of native coronary artery without angina pectoris: Secondary | ICD-10-CM | POA: Diagnosis not present

## 2015-06-12 DIAGNOSIS — I10 Essential (primary) hypertension: Secondary | ICD-10-CM

## 2015-06-12 DIAGNOSIS — R0989 Other specified symptoms and signs involving the circulatory and respiratory systems: Secondary | ICD-10-CM | POA: Diagnosis not present

## 2015-06-12 DIAGNOSIS — E785 Hyperlipidemia, unspecified: Secondary | ICD-10-CM | POA: Diagnosis not present

## 2015-06-12 DIAGNOSIS — E1151 Type 2 diabetes mellitus with diabetic peripheral angiopathy without gangrene: Secondary | ICD-10-CM

## 2015-06-12 DIAGNOSIS — E1165 Type 2 diabetes mellitus with hyperglycemia: Secondary | ICD-10-CM

## 2015-06-12 DIAGNOSIS — I2583 Coronary atherosclerosis due to lipid rich plaque: Principal | ICD-10-CM

## 2015-06-12 NOTE — Progress Notes (Signed)
Cardiology Office Note   Date:  06/12/2015   ID:  [name] [name], DOB 26-Oct-1950, MRN [id number]  PCP:  Marga Melnick, MD  Cardiologist:  Donato Schultz, MD  Endocrine: Dr. Lucianne Muss      History of Present Illness: [name] [name] is a 64 y.o. male former patient of Dr. Myrtis Ser who presents today to follow-up CAD/CABG artery disease and hyperlipidemia, former PCI to RCA 8 use prior to his bypass surgery.  He agreed previously to change from 20 of simvastatin to 20 of Lipitor. He tells me that he had nighttime leg cramps at first. This improved over time. He is stable now. His follow-up labs reveal an LDL of 56. TSH was normal. Unfortunately his triglycerides are high. I'm sure that goes along with his diabetes. He has been seeing Dr. Lucianne Muss, endocrinology. Has Lovaza as well.  Triglycerides 544 on 04/15/15, hemoglobin A1c 7.6.  Never had symptoms prior to CABG. Dr. Alwyn Ren did ETT. Abnormal. First RCA stent in 2004 then CABG 2012.   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 Takes trips to the [place] occasionally travel.  Past Medical History  Diagnosis Date  . Food poisoning     age 56  . Rash   . Hypertension   . Hyperlipidemia   . Diabetes mellitus   . CAD (coronary artery disease)     a. 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 b. s/p CABG 04/2011.  [name] Aortic valve sclerosis     echo june 2009  . PVC (premature ventricular contraction) 06/2009    November, 2010  . GERD (gastroesophageal reflux disease)   . Muscle ache     on Crestor  09/04/09 CPK 193(7-232) tolerated simvastatin in the past  . Ejection fraction     a. Ejection fraction 60%, echo, June, 2009. b. EF preserved by cath 2012.  [name] Hx of CABG     [name],  x4  May 01, 2011 limited to the LAD, left radial artery to the OM, SVG to diagonal, SVG to posterior descending  . Carotid artery disease (HCC)     Doppler, preop, September, 2012,  mild bilateral plaque with no  significant stenoses  . Dizziness     June, 2013    Past Surgical History  Procedure Laterality Date  . Coccyx bone removed    . Pilonidal cystectomy    . Back surgery    . Intraoperative transesophageal echocardiography.  05/01/2011  . Coronary artery bypass graft  05/01/2011    CABG x4 with LIMA to LAD, LRA to OM, SVG to D1, SVG to PDA, EVH via left thigh    Patient Active Problem List   Diagnosis Date Noted  . Dizziness   . Carotid artery disease (HCC)   . Hx of CABG   . CAD (coronary artery disease)   . Hyperlipidemia   . Aortic valve sclerosis   . GERD (gastroesophageal reflux disease)   . Muscle ache   . Ejection fraction   . KNEE PAIN, LEFT, CHRONIC 05/21/2010  . PVC (premature ventricular contraction) 06/03/2009  . Carotid bruit 06/03/2009  . Type II diabetes mellitus with peripheral circulatory disorder, uncontrolled (HCC) 03/27/2008  . HYPERTENSION 03/27/2008      Current Outpatient Prescriptions  Medication Sig Dispense Refill  . aspirin 81 MG tablet Take 81 mg by mouth daily.      [name] atorvastatin (LIPITOR) 20 MG tablet TAKE 1 TABLET (20 MG TOTAL) BY MOUTH  DAILY. 30 tablet 3  . canagliflozin (INVOKANA) 300 MG TABS tablet Take 300 mg by mouth daily before breakfast. 30 tablet 0  . glucose blood test strip One touch ultra blue test strip. Test blood sugar once daily. Dx code:E11.09. 300 each 3  . Icosapent Ethyl (VASCEPA) 1 G CAPS 4 g daily 120 capsule 3  . Insulin Degludec (TRESIBA FLEXTOUCH) 200 UNIT/ML SOPN Inject 110 Units into the skin once. 9 pen 2  . Insulin Detemir (LEVEMIR FLEXTOUCH) 100 UNIT/ML Pen Inject  65 units every am and 25 units every pm 30 mL 3  . NOVOFINE 32G X 6 MM MISC USE ONCE DAILY WITH LEVIMIR INJECTION 100 each 1  . ONETOUCH DELICA LANCETS 33G MISC Test blood sugar once daily. Dx code: E11.09 300 each 3  . ramipril (ALTACE) 2.5 MG capsule Take 1 capsule (2.5 mg total) by mouth daily. 90 capsule 3  . SitaGLIPtin-MetFORMIN HCl (JANUMET XR)  50-1000 MG TB24 Take 2 tablets daily 60 tablet 3   No current facility-administered medications for this visit.    Allergies:   Rosuvastatin    Social History:  The patient  reports that he has never smoked. He does not have any smokeless tobacco history on file. He reports that he does not drink alcohol or use illicit drugs.   Family History:  The patient's family history includes Cancer in his maternal grandfather; Diabetes in his father, mother, paternal aunt, and paternal uncle; Hearing loss in his father and paternal grandfather; Heart attack in his paternal grandfather; Heart attack (age of onset: 5565) in his father; Heart failure in his maternal grandmother; Hypertension in his father; Prostate cancer in his maternal grandfather. There is no history of Stroke.    ROS:  Please see the history of present illness.     Patient denies fever, chills, headache, sweats, rash, change in vision, change in hearing, chest pain, cough, nausea or vomiting, urinary symptoms. All other systems are reviewed and are negative.   PHYSICAL EXAM: VS:  There were no vitals taken for this visit. , Patient is overweight. He is oriented to person time and place. Affect is normal. Head is atraumatic. Sclera and conjunctiva are normal. There is no jugular venous distention. Lungs are clear. Respiratory effort is not labored. Cardiac exam reveals S1 and S2. The abdomen is soft. There is no peripheral edema. There are no musculoskeletal deformities. There are no skin rashes.  EKG:   Today 06/12/15-sinus rhythm, 68, nonspecific ST-T wave changes, no other abnormalities. Poor R wave progression.  Echocardiogram: 01/24/2008  - Normal ejection fraction  - Mild aortic regurgitation  - Mild mitral regurgitation  Carotid ultrasound 04/28/2011  - Mild bilateral plaque  Recent Labs: 11/20/2014: TSH 2.30 04/15/2015: ALT 20; BUN 14; Creatinine, Ser 0.66; Potassium 4.4; Sodium 139    Lipid Panel    Component Value  Date/Time   CHOL 123 04/15/2015 1003   TRIG 544.0* 04/15/2015 1003   HDL 28.00* 04/15/2015 1003   CHOLHDL 4 04/15/2015 1003   VLDL 32.4 04/25/2014 0957   LDLCALC 64 04/25/2014 0957   LDLDIRECT 57.0 04/15/2015 1003      Wt Readings from Last 3 Encounters:  06/03/15 206 lb 9 oz (93.696 kg)  04/22/15 208 lb (94.348 kg)  02/11/15 207 lb 6.4 oz (94.076 kg)      Current medicines are reviewed  The patient understands his medications.     ASSESSMENT AND PLAN:  Coronary artery disease/status post CABG  - Stable, no exertional  anginal symptoms - - Continuing aggressive secondary prevention  Hyperlipidemia  - Dr. Myrtis Ser changed to a high potency statin, 20 mg of atorvastatin. Encouraged continued enhancement of his diabetes, Dr. Lucianne Muss.  - Dr. Lucianne Muss has given him Lovaza. Continue.  Diabetes with peripheral vascular disease/carotid artery plaque  - Dr. Lucianne Muss has been working with him.  - Triglycerides will improve as diabetes improves as well.  Carotid artery plaque  - Mild plaque noted on ultrasound. Continue with aggressive secondary prevention.  Donato Schultz, MD

## 2015-06-12 NOTE — Patient Instructions (Signed)

## 2015-07-01 ENCOUNTER — Other Ambulatory Visit: Payer: Self-pay | Admitting: Endocrinology

## 2015-07-10 ENCOUNTER — Other Ambulatory Visit: Payer: Self-pay | Admitting: Cardiology

## 2015-07-15 ENCOUNTER — Ambulatory Visit: Payer: BLUE CROSS/BLUE SHIELD | Admitting: Endocrinology

## 2015-07-27 ENCOUNTER — Other Ambulatory Visit: Payer: Self-pay | Admitting: Endocrinology

## 2015-08-14 ENCOUNTER — Other Ambulatory Visit: Payer: Self-pay | Admitting: Endocrinology

## 2015-08-25 ENCOUNTER — Other Ambulatory Visit: Payer: Self-pay | Admitting: Endocrinology

## 2015-09-03 ENCOUNTER — Telehealth: Payer: Self-pay | Admitting: Endocrinology

## 2015-09-03 ENCOUNTER — Other Ambulatory Visit: Payer: Self-pay | Admitting: Endocrinology

## 2015-09-03 NOTE — Telephone Encounter (Signed)
rx was sent in today, no further refills until he's seen in the office.

## 2015-09-03 NOTE — Telephone Encounter (Signed)
Pt is in need of refill on Janumet? (He thinks its janumet) he says it is the pill [facility] please

## 2015-09-10 ENCOUNTER — Ambulatory Visit (INDEPENDENT_AMBULATORY_CARE_PROVIDER_SITE_OTHER): Payer: BLUE CROSS/BLUE SHIELD | Admitting: Endocrinology

## 2015-09-10 ENCOUNTER — Other Ambulatory Visit: Payer: Self-pay | Admitting: Endocrinology

## 2015-09-10 ENCOUNTER — Encounter: Payer: Self-pay | Admitting: Endocrinology

## 2015-09-10 VITALS — BP 122/72 | HR 74 | Temp 97.9°F | Resp 14 | Ht 69.0 in | Wt 213.8 lb

## 2015-09-10 DIAGNOSIS — E041 Nontoxic single thyroid nodule: Secondary | ICD-10-CM

## 2015-09-10 DIAGNOSIS — E1165 Type 2 diabetes mellitus with hyperglycemia: Secondary | ICD-10-CM

## 2015-09-10 DIAGNOSIS — Z794 Long term (current) use of insulin: Secondary | ICD-10-CM | POA: Diagnosis not present

## 2015-09-10 LAB — BASIC METABOLIC PANEL
BUN: 13 mg/dL (ref 6–23)
CALCIUM: 9.4 mg/dL (ref 8.4–10.5)
CO2: 27 meq/L (ref 19–32)
CREATININE: 0.63 mg/dL (ref 0.40–1.50)
Chloride: 106 mEq/L (ref 96–112)
GFR: 136.63 mL/min (ref >60.00)
GLUCOSE: 221 mg/dL — AB (ref 70–99)
Potassium: 3.9 mEq/L (ref 3.5–5.1)
Sodium: 139 mEq/L (ref 135–145)

## 2015-09-10 LAB — POCT GLYCOSYLATED HEMOGLOBIN (HGB A1C): Hemoglobin A1C: 7.5

## 2015-09-10 LAB — TSH: TSH: 1.65 u[IU]/mL (ref 0.35–4.50)

## 2015-09-10 NOTE — Progress Notes (Signed)
Quick Note:  Please let patient know that the blood sugar was 221. Needs to start watching his diet and avoiding high fat and high carbohydrate meals and start checking readings 2 hours after various meals Thyroid blood test is okay ______

## 2015-09-10 NOTE — Progress Notes (Signed)
Patient ID: [name] [name], male   DOB: [birth year], 65 y.o.   MRN: [id number]           Reason for Appointment: Follow-up for Type 2 Diabetes  Referring physician: Alwyn Ren  History of Present Illness:          Date of diagnosis of type 2 diabetes mellitus :        Background history:  He was probably started on metformin at diagnosis and subsequently given Amaryl also Subsequently had been on Janumet and more recently this was changed to [facility]. Because of poor control he was given Levemir in addition to his Kombiglyze in 01/2013 The dose was progressively increased from initial dose of 12 units He did have somewhat better control right after starting insulin with A1c below 7% but only one time His blood sugars have been consistently high since 2014 with A1c at least 8%  Recent history:   INSULIN regimen is described as: TRESIBA 105  units at bedtime   Oral hypoglycemic drugs the patient is taking are: Janumet twice a day, Invokana 300 mg daily       He was given a trial of Invokana and 01/2015 because of his persistently high blood sugars including fasting despite taking relatively large insulin doses and Janumet XR    Also on his last visit he was switched from Levemir to [facility], previously taking 100 units of Levemir  His A1c is still relatively higher at 7.5, about the same as in 9/16  Current blood sugar patterns, management and problems identified:    Recently is checking blood sugars only in the morning and only on 7 days in the last month  He has variable readings in the mornings but mostly high  Not clear if his sugars are high after supper also has a does not monitor postprandial readings  He does not adjust his insulin based on fasting blood sugar patterns  Despite taking Invokana his weight has gone up, likely to be from an consistent diet  Also recently not able to exercise, only recently starting to do a little walking  Side effects from medications have  been: Polyuria from Invokana  Hypoglycemia: None    Glucose monitoring:  done less than once  in a day         Glucometer: One Touch.      Blood Glucose readings from download:   Mean values apply above for all meters except median for One Touch  PRE-MEAL Fasting Lunch Dinner Bedtime Overall  Glucose range:  133-231    147     Mean/median: 187       Self-care: The diet that the patient has been following is: tries to limit carbs.     Meal times: Breakfast: 9 AM Lunch: 1 PM Dinner: 6 PM   Typical meal intake: Breakfast is steak biscuit.  Lunch is a sandwich or vegetables, variable meal at suppertime.  For snacks will have popcorn               Dietician visit, most recent: 6/16               Exercise:  he is not able to walk much  Weight history: Highest  previously 256  Wt Readings from Last 3 Encounters:  09/10/15 213 lb 12.8 oz (96.979 kg)  06/12/15 208 lb 12.8 oz (94.711 kg)  06/03/15 206 lb 9 oz (93.696 kg)    Glycemic control:   Lab Results  Component Value Date  HGBA1C 7.5 09/10/2015   HGBA1C 7.6* 04/15/2015   HGBA1C 9.2* 11/20/2014   Lab Results  Component Value Date   MICROALBUR 1.4 11/20/2014   LDLCALC 64 04/25/2014   CREATININE 0.63 09/10/2015         Medication List       This list is accurate as of: 09/10/15  9:07 PM.  Always use your most recent med list.               aspirin 81 MG tablet  Take 81 mg by mouth daily.     atorvastatin 20 MG tablet  Commonly known as:  LIPITOR  TAKE 1 TABLET (20 MG TOTAL) BY MOUTH DAILY.     canagliflozin 300 MG Tabs tablet  Commonly known as:  INVOKANA  Take 300 mg by mouth daily before breakfast.     glucose blood test strip  One touch ultra blue test strip. Test blood sugar once daily. Dx code:E11.09.     Icosapent Ethyl 1 g Caps  Commonly known as:  VASCEPA  4 g daily     JANUMET XR 50-1000 MG Tb24  Generic drug:  SitaGLIPtin-MetFORMIN HCl  TAKE 2 TABLETS BY MOUTH EVERY DAY     NOVOFINE 32G X  6 MM Misc  Generic drug:  Insulin Pen Needle  USE ONCE DAILY WITH LEVIMIR INJECTION     ONETOUCH DELICA LANCETS 33G Misc  Test blood sugar once daily. Dx code: E11.09     ramipril 2.5 MG capsule  Commonly known as:  ALTACE  Take 1 capsule (2.5 mg total) by mouth daily.     ramipril 5 MG capsule  Commonly known as:  ALTACE  TAKE 1 CAPSULE (5 MG TOTAL) BY MOUTH DAILY.     TRESIBA FLEXTOUCH 200 UNIT/ML Sopn  Generic drug:  Insulin Degludec  INJECT 110 UNITS INTO THE SKIN ONCE.        Allergies:  Allergies  Allergen Reactions  . Rosuvastatin Other (See Comments)    REACTION: myalgias    Past Medical History  Diagnosis Date  . Food poisoning     age 49  . Rash   . Hypertension   . Hyperlipidemia   . Diabetes mellitus   . CAD (coronary artery disease)     a. 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 b. s/p CABG 04/2011.  [name] Aortic valve sclerosis     echo june 2009  . PVC (premature ventricular contraction) 06/2009    November, 2010  . GERD (gastroesophageal reflux disease)   . Muscle ache     on Crestor  09/04/09 CPK 193(7-232) tolerated simvastatin in the past  . Ejection fraction     a. Ejection fraction 60%, echo, June, 2009. b. EF preserved by cath 2012.  [name] Hx of CABG     [name],  x4  May 01, 2011 limited to the LAD, left radial artery to the OM, SVG to diagonal, SVG to posterior descending  . Carotid artery disease (HCC)     Doppler, preop, September, 2012,  mild bilateral plaque with no significant stenoses  . Dizziness     June, 2013    Past Surgical History  Procedure Laterality Date  . Coccyx bone removed    . Pilonidal cystectomy    . Back surgery    . Intraoperative transesophageal echocardiography.  05/01/2011  . Coronary artery bypass graft  05/01/2011    CABG x4 with LIMA to LAD, LRA to OM,  SVG to D1, SVG to PDA, EVH via left thigh    Family History  Problem Relation Age of Onset  . Hypertension Father     . Heart attack Father 65    bypass  . Diabetes Father   . Hearing loss Father   . Thyroid disease Father   . Heart attack Paternal Grandfather     late 38s  . Hearing loss Paternal Grandfather   . Cancer Maternal Grandfather     site unknown  . Prostate cancer Maternal Grandfather   . Diabetes Paternal Aunt     X 3  . Heart failure Maternal Grandmother   . Diabetes Mother   . Diabetes Paternal Uncle   . Stroke Neg Hx     Social History:  reports that he has never smoked. He does not have any smokeless tobacco history on file. He reports that he does not drink alcohol or use illicit drugs.    Review of Systems    Lipid history: He has been advised to take Vascepa for significantly high triglycerides along with his Lipitor but apparently is not taking this  because of cost and triglycerides are not control    Lab Results  Component Value Date   CHOL 123 04/15/2015   HDL 28.00* 04/15/2015   LDLCALC 64 04/25/2014   LDLDIRECT 57.0 04/15/2015   TRIG 544.0* 04/15/2015   CHOLHDL 4 04/15/2015            THYROID: He has a 2.4 cm dominant left-sided thyroid nodule on Ultrasound in 12/2014 but he did not get his biopsy done because of cost  HYPERTENSION: Taking only 2.5 mg ramipril   LABS:  Office Visit on 09/10/2015  Component Date Value Ref Range Status  . Hemoglobin A1C 09/10/2015 7.5   Final  . Sodium 09/10/2015 139  135 - 145 mEq/L Final  . Potassium 09/10/2015 3.9  3.5 - 5.1 mEq/L Final  . Chloride 09/10/2015 106  96 - 112 mEq/L Final  . CO2 09/10/2015 27  19 - 32 mEq/L Final  . Glucose, Bld 09/10/2015 221* 70 - 99 mg/dL Final  . BUN 16/05/9603 13  6 - 23 mg/dL Final  . Creatinine, Ser 09/10/2015 0.63  0.40 - 1.50 mg/dL Final  . Calcium 54/04/8118 9.4  8.4 - 10.5 mg/dL Final  . GFR 14/78/2956 136.63  >60.00 mL/min Final  . TSH 09/10/2015 1.65  0.35 - 4.50 uIU/mL Final    Physical Examination:  BP 122/72 mmHg  Pulse 74  Temp(Src) 97.9 F (36.6 C)  Resp 14   Ht 5\' 9"  (1.753 m)  Wt 213 lb 12.8 oz (96.979 kg)  BMI 31.56 kg/m2  SpO2 95%     ASSESSMENT:  Diabetes type 2, uncontrolled with persistently high A1c    See history of present illness for detailed discussion of his current management, blood sugar patterns and problems identified Although his blood sugars are not as consistently high as before with using [name] compared to Levemir he is still having high fasting readings A1c is still over 7% Has gained weight from decrease and exercise level and also consistent diet over the winter Has had some benefit from taking Invokana and continuing Janumet  HYPERLIPIDEMIA: He has high triglycerides  THYROID nodule: Since it is about 9 months since his last ultrasound will repeat this and decide on biopsy subsequently based on the nodule size  HYPERTENSION: Blood pressure is well controlled with ramipril, even with reducing the dose to 2.5, benefiting from [facility]  PLAN:   Increase Tresiba to 115 units  Continue Invokana  300 mg  Start checking blood sugars after meals consistently  Increase walking as tolerated  More consistent diet  Given him a flowsheet again to work on increasing his insulin by 5 units every 3 days until fasting blood sugar is below 130  Consider mealtime insulin if blood sugars are high after certain meals  Reschedule thyroid ultrasound and also check TSH  Recheck lipids on the next visit  Patient Instructions  Check blood sugars on waking up   times a week Also check blood sugars about 2 hours after a meal and do this after different meals by rotation  Recommended blood sugar levels on waking up is 90-130 and about 2 hours after meal is 130-160  Please bring your blood sugar monitor to each visit, thank you  Tresiba 115 units daily to get am sugars <150     Counseling time on subjects discussed above is over 50% of today's 60 minute visit  Porfirio Bollier 09/10/2015, 9:07 PM   Note: This office note  was prepared with Insurance underwriter. Any transcriptional errors that result from this process are unintentional.

## 2015-09-10 NOTE — Patient Instructions (Addendum)
Check blood sugars on waking up   times a week Also check blood sugars about 2 hours after a meal and do this after different meals by rotation  Recommended blood sugar levels on waking up is 90-130 and about 2 hours after meal is 130-160  Please bring your blood sugar monitor to each visit, thank you  Tresiba 115 units daily to get am sugars <150

## 2015-09-16 ENCOUNTER — Other Ambulatory Visit: Payer: Self-pay | Admitting: Endocrinology

## 2015-09-19 ENCOUNTER — Other Ambulatory Visit: Payer: Self-pay | Admitting: *Deleted

## 2015-09-19 ENCOUNTER — Telehealth: Payer: Self-pay | Admitting: Endocrinology

## 2015-09-19 MED ORDER — INSULIN DEGLUDEC 200 UNIT/ML ~~LOC~~ SOPN: 105.0000 [IU] | PEN_INJECTOR | Freq: Every day | SUBCUTANEOUS | Status: DC

## 2015-09-19 NOTE — Telephone Encounter (Signed)
Patient need a refill of medication TRESIBA FLEXTOUCH 200 UNIT/ML SOPN send to  [id number] - [place], [place] - [address] AT [facility] [address] [facility] [phone] (Phone) [phone] (Fax)

## 2015-09-19 NOTE — Telephone Encounter (Signed)
rx sent

## 2015-09-29 ENCOUNTER — Other Ambulatory Visit: Payer: Self-pay | Admitting: Endocrinology

## 2015-10-01 ENCOUNTER — Other Ambulatory Visit: Payer: Self-pay | Admitting: Endocrinology

## 2015-10-10 ENCOUNTER — Telehealth: Payer: Self-pay | Admitting: Endocrinology

## 2015-10-10 ENCOUNTER — Other Ambulatory Visit: Payer: Self-pay | Admitting: *Deleted

## 2015-10-10 MED ORDER — INSULIN DEGLUDEC 200 UNIT/ML ~~LOC~~ SOPN: 105.0000 [IU] | PEN_INJECTOR | Freq: Every day | SUBCUTANEOUS | Status: DC

## 2015-10-10 NOTE — Telephone Encounter (Signed)
[facility] [place]

## 2015-10-10 NOTE — Telephone Encounter (Signed)
Pt called and [facility] [place]

## 2015-11-18 ENCOUNTER — Other Ambulatory Visit: Payer: Self-pay

## 2015-11-18 MED ORDER — INSULIN PEN NEEDLE 32G X 6 MM MISC: Status: DC

## 2015-12-03 ENCOUNTER — Ambulatory Visit (INDEPENDENT_AMBULATORY_CARE_PROVIDER_SITE_OTHER): Payer: BLUE CROSS/BLUE SHIELD | Admitting: Cardiology

## 2015-12-03 ENCOUNTER — Other Ambulatory Visit: Payer: BLUE CROSS/BLUE SHIELD

## 2015-12-03 ENCOUNTER — Ambulatory Visit: Payer: BLUE CROSS/BLUE SHIELD | Admitting: Family

## 2015-12-03 ENCOUNTER — Encounter: Payer: Self-pay | Admitting: Cardiology

## 2015-12-03 VITALS — BP 140/82 | HR 68 | Ht 69.0 in | Wt 216.4 lb

## 2015-12-03 DIAGNOSIS — I779 Disorder of arteries and arterioles, unspecified: Secondary | ICD-10-CM | POA: Diagnosis not present

## 2015-12-03 DIAGNOSIS — I2510 Atherosclerotic heart disease of native coronary artery without angina pectoris: Secondary | ICD-10-CM

## 2015-12-03 DIAGNOSIS — I739 Peripheral vascular disease, unspecified: Secondary | ICD-10-CM

## 2015-12-03 DIAGNOSIS — E785 Hyperlipidemia, unspecified: Secondary | ICD-10-CM | POA: Diagnosis not present

## 2015-12-03 DIAGNOSIS — I2583 Coronary atherosclerosis due to lipid rich plaque: Principal | ICD-10-CM

## 2015-12-03 NOTE — Patient Instructions (Signed)
Medication Instructions:  The current medical regimen is effective;  continue present plan and medications.  Follow-Up: Follow up in 1 year with Dr. Anne FuSkains.  You will receive a letter in the mail 2 months before you are due.  Please call us when you receive this letter to schedule your follow up appointment.  If you need a refill on your cardiac medications before your next appointment, please call your pharmacy.  Thank you for choosing [facility]!!    Dr Creola CornJohn Russo [facility] [address] [place], [place] [address] [phone]

## 2015-12-03 NOTE — Progress Notes (Signed)
Cardiology Office Note   Date:  12/03/2015   ID:  [name] [name], DOB 01-16-1951, MRN [id number]  PCP:  [name] MelnickWilliam Hopper, MD  Cardiologist:  Donato SchultzSKAINS, [name], MD  Endocrine: [name] [name]      History of Present Illness: [name] SprinklesHarold D [name] [name] is a 65 y.o. male former patient of Dr. Myrtis [name] who presents today to follow-up CAD/CABG artery disease and hyperlipidemia, former PCI to RCA  prior to his bypass surgery.  He agreed previously to change from 20 of simvastatin to 20 of Lipitor.   He tells me that he previously had nighttime leg cramps at first. This improved over time. He is stable now. His follow-up labs reveal an LDL of 56. TSH was normal. Unfortunately his triglycerides are high. I'm sure that goes along with his diabetes. He has been seeing [name] [name], endocrinology. Has Lovaza as well.  Triglycerides 544 on 04/15/15, hemoglobin A1c 7.5. Says he's taking himself off of Invokana. Was causing him to urinate at night and he was also concerned about the commercials on TV from the attorneys.  Never had symptoms prior to CABG. [name] did ETT. Abnormal. First RCA stent in 2004 then CABG 2012.   I am seeing his father as well. His father has had carotid endarterectomy.  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 [place] trips [place] travel.  Past Medical History  Diagnosis Date  . Food poisoning     age 65  . Rash   . Hypertension   . Hyperlipidemia   . Diabetes mellitus   . CAD (coronary artery disease)     a. 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 b. s/p CABG 04/2011.  [name]. Aortic valve sclerosis     echo june 2009  . PVC (premature ventricular contraction) 06/2009    November, 2010  . GERD (gastroesophageal reflux disease)   . Muscle ache     on Crestor  09/04/09 CPK 193(7-232) tolerated simvastatin in the past  . Ejection fraction     a. Ejection fraction 60%, echo, June, 2009. b. EF preserved by cath 2012.  [name]. Hx of  CABG     [name] [name],  x4  May 01, 2011 limited to the LAD, left radial artery to the OM, SVG to diagonal, SVG to posterior descending  . Carotid artery disease (HCC)     Doppler, preop, September, 2012,  mild bilateral plaque with no significant stenoses  . Dizziness     June, 2013    Past Surgical History  Procedure Laterality Date  . Coccyx bone removed    . Pilonidal cystectomy    . Back surgery    . Intraoperative transesophageal echocardiography.  05/01/2011  . Coronary artery bypass graft  05/01/2011    CABG x4 with LIMA to LAD, LRA to OM, SVG to D1, SVG to PDA, EVH via left thigh    Patient Active Problem List   Diagnosis Date Noted  . Dizziness   . Carotid artery disease (HCC)   . Hx of CABG   . CAD (coronary artery disease)   . Hyperlipidemia   . Aortic valve sclerosis   . GERD (gastroesophageal reflux disease)   . Muscle ache   . Ejection fraction   . KNEE PAIN, LEFT, CHRONIC 05/21/2010  . PVC (premature ventricular contraction) 06/03/2009  . Carotid bruit 06/03/2009  . Type II diabetes mellitus with peripheral circulatory disorder, uncontrolled (HCC) 03/27/2008  . Essential hypertension 03/27/2008  Current Outpatient Prescriptions  Medication Sig Dispense Refill  . aspirin 81 MG tablet Take 81 mg by mouth daily.      [name] atorvastatin (LIPITOR) 20 MG tablet TAKE 1 TABLET (20 MG TOTAL) BY MOUTH DAILY. 30 tablet 10  . canagliflozin (INVOKANA) 300 MG TABS tablet Take 300 mg by mouth daily before breakfast. 30 tablet 0  . glucose blood test strip One touch ultra blue test strip. Test blood sugar once daily. Dx code:E11.09. 300 each 3  . Icosapent Ethyl (VASCEPA) 1 G CAPS 4 g daily 120 capsule 3  . Insulin Degludec (TRESIBA FLEXTOUCH) 200 UNIT/ML SOPN Inject 106 Units into the skin daily. 9 mL 3  . Insulin Pen Needle (NOVOFINE) 32G X 6 MM MISC USE ONCE DAILY WITH LEVIMIR INJECTION 100 each 1  . JANUMET XR 50-1000 MG TB24 TAKE 2 TABLETS BY MOUTH EVERY DAY 60 tablet  2  . JANUMET XR 50-1000 MG TB24 TAKE 2 TABLETS BY MOUTH EVERY DAY 60 tablet 3  . ONETOUCH DELICA LANCETS 33G MISC Test blood sugar once daily. Dx code: E11.09 300 each 3  . ramipril (ALTACE) 2.5 MG capsule Take 1 capsule (2.5 mg total) by mouth daily. 90 capsule 3  . ramipril (ALTACE) 5 MG capsule TAKE 1 CAPSULE (5 MG TOTAL) BY MOUTH DAILY. 30 capsule 3  . ramipril (ALTACE) 5 MG capsule TAKE 1 CAPSULE (5 MG TOTAL) BY MOUTH DAILY. 30 capsule 3   No current facility-administered medications for this visit.    Allergies:   Rosuvastatin    Social History:  The patient  reports that he has never smoked. He does not have any smokeless tobacco history on file. He reports that he does not drink alcohol or use illicit drugs.   Family History:  The patient's family history includes Cancer in his maternal grandfather; Diabetes in his father, mother, paternal aunt, and paternal uncle; Hearing loss in his father and paternal grandfather; Heart attack in his paternal grandfather; Heart attack (age of onset: 38) in his father; Heart failure in his maternal grandmother; Hypertension in his father; Prostate cancer in his maternal grandfather; Thyroid disease in his father. There is no history of Stroke.    ROS:  Please see the history of present illness.     Patient denies fever, chills, headache, sweats, rash, change in vision, change in hearing, chest pain, cough, nausea or vomiting, urinary symptoms. All other systems are reviewed and are negative.   PHYSICAL EXAM: VS:  There were no vitals taken for this visit. , Patient is overweight. He is oriented to person time and place. Affect is normal. Head is atraumatic. Sclera and conjunctiva are normal. There is no jugular venous distention. Lungs are clear. Respiratory effort is not labored. Cardiac exam reveals S1 and S2. The abdomen is soft. There is no peripheral edema. There are no musculoskeletal deformities. There are no skin rashes.  EKG:   Today  06/12/15-sinus rhythm, 68, nonspecific ST-T wave changes, no other abnormalities. Poor R wave progression.  Echocardiogram: 01/24/2008  - Normal ejection fraction  - Mild aortic regurgitation  - Mild mitral regurgitation  Carotid ultrasound 04/28/2011  - Mild bilateral plaque  Recent Labs: 04/15/2015: ALT 20 09/10/2015: BUN 13; Creatinine, Ser 0.63; Potassium 3.9; Sodium 139; TSH 1.65    Lipid Panel    Component Value Date/Time   CHOL 123 04/15/2015 1003   TRIG 544.0* 04/15/2015 1003   HDL 28.00* 04/15/2015 1003   CHOLHDL 4 04/15/2015 1003   VLDL 32.4  04/25/2014 0957   LDLCALC 64 04/25/2014 0957   LDLDIRECT 57.0 04/15/2015 1003      Wt Readings from Last 3 Encounters:  09/10/15 213 lb 12.8 oz (96.979 kg)  06/12/15 208 lb 12.8 oz (94.711 kg)  06/03/15 206 lb 9 oz (93.696 kg)      Current medicines are reviewed  The patient understands his medications.     ASSESSMENT AND PLAN:  Coronary artery disease/status post CABG  - Stable, no exertional anginal symptoms -  Continuing aggressive secondary prevention, Medications reviewed  Hyperlipidemia  - Dr. Myrtis Ser changed to a high potency statin, 20 mg of atorvastatin. Encouraged continued enhancement of his diabetes, Dr. Lucianne Muss.  - Dr. Lucianne Muss has given him Lovaza. Continue.  Diabetes with peripheral vascular disease/carotid artery plaque  - Dr. Lucianne Muss has been working with him.  - Triglycerides will improve as diabetes improves as well. Mild plaque  Carotid artery plaque  - Mild plaque noted on ultrasound. Continue with aggressive secondary prevention. He was concerned because his father had a severe carotid artery stenosis that required carotid endarterectomy in his late 26s.  Recommended establish primary care.  Follow up 1 year.  Donato Schultz, MD

## 2015-12-09 ENCOUNTER — Ambulatory Visit: Payer: BLUE CROSS/BLUE SHIELD | Admitting: Endocrinology

## 2015-12-10 ENCOUNTER — Ambulatory Visit: Payer: BLUE CROSS/BLUE SHIELD | Admitting: Family

## 2015-12-10 ENCOUNTER — Other Ambulatory Visit: Payer: Self-pay | Admitting: Endocrinology

## 2015-12-16 ENCOUNTER — Other Ambulatory Visit (INDEPENDENT_AMBULATORY_CARE_PROVIDER_SITE_OTHER): Payer: BLUE CROSS/BLUE SHIELD

## 2015-12-16 DIAGNOSIS — E1165 Type 2 diabetes mellitus with hyperglycemia: Secondary | ICD-10-CM

## 2015-12-16 LAB — LIPID PANEL
CHOL/HDL RATIO: 4
Cholesterol: 114 mg/dL (ref 0–200)
HDL: 27.6 mg/dL — ABNORMAL LOW (ref >39.00)
NONHDL: 86.19
TRIGLYCERIDES: 278 mg/dL — AB (ref 0.0–149.0)
VLDL: 55.6 mg/dL — AB (ref 0.0–40.0)

## 2015-12-16 LAB — HEMOGLOBIN A1C: Hgb A1c MFr Bld: 7.2 % — ABNORMAL HIGH (ref 4.6–6.5)

## 2015-12-16 LAB — LDL CHOLESTEROL, DIRECT: Direct LDL: 61 mg/dL

## 2015-12-16 LAB — COMPREHENSIVE METABOLIC PANEL
ALT: 21 U/L (ref 0–53)
AST: 15 U/L (ref 0–37)
Albumin: 4.3 g/dL (ref 3.5–5.2)
Alkaline Phosphatase: 34 U/L — ABNORMAL LOW (ref 39–117)
BUN: 10 mg/dL (ref 6–23)
CHLORIDE: 105 meq/L (ref 96–112)
CO2: 26 meq/L (ref 19–32)
CREATININE: 0.61 mg/dL (ref 0.40–1.50)
Calcium: 9 mg/dL (ref 8.4–10.5)
GFR: 141.69 mL/min (ref >60.00)
Glucose, Bld: 225 mg/dL — ABNORMAL HIGH (ref 70–99)
Potassium: 4 mEq/L (ref 3.5–5.1)
SODIUM: 138 meq/L (ref 135–145)
Total Bilirubin: 0.6 mg/dL (ref 0.2–1.2)
Total Protein: 6.7 g/dL (ref 6.0–8.3)

## 2015-12-17 ENCOUNTER — Other Ambulatory Visit: Payer: Self-pay | Admitting: Endocrinology

## 2015-12-23 ENCOUNTER — Ambulatory Visit (INDEPENDENT_AMBULATORY_CARE_PROVIDER_SITE_OTHER): Payer: BLUE CROSS/BLUE SHIELD | Admitting: Endocrinology

## 2015-12-23 ENCOUNTER — Encounter: Payer: Self-pay | Admitting: Endocrinology

## 2015-12-23 VITALS — BP 126/78 | HR 63 | Temp 98.0°F | Resp 14 | Ht 69.0 in | Wt 215.0 lb

## 2015-12-23 DIAGNOSIS — Z794 Long term (current) use of insulin: Secondary | ICD-10-CM

## 2015-12-23 DIAGNOSIS — E1165 Type 2 diabetes mellitus with hyperglycemia: Secondary | ICD-10-CM | POA: Diagnosis not present

## 2015-12-23 MED ORDER — VICTOZA 18 MG/3ML ~~LOC~~ SOPN: 1.2000 mg | PEN_INJECTOR | Freq: Every day | SUBCUTANEOUS | Status: DC

## 2015-12-23 MED ORDER — METFORMIN HCL ER 500 MG PO TB24: 2000.0000 mg | ORAL_TABLET | Freq: Every day | ORAL | Status: DC

## 2015-12-23 NOTE — Progress Notes (Signed)
Patient ID: [name] [name], male   DOB: 1950/09/27, 65 y.o.   MRN: [id number]           Reason for Appointment: Follow-up for Type 2 Diabetes   History of Present Illness:          Date of diagnosis of type 2 diabetes mellitus: 2011        Background history:  He was probably started on metformin at diagnosis and subsequently given Amaryl also Subsequently had been on Janumet and more recently this was changed to [facility]. Because of poor control he was given Levemir in addition to his Kombiglyze in 01/2013 The dose was progressively increased from initial dose of 12 units He did have somewhat better control right after starting insulin with A1c below 7% but only one time His blood sugars had been consistently high since 2014 with A1c at least 8%  Recent history:   INSULIN regimen is described as: TRESIBA 110   units at bedtime   Oral hypoglycemic drugs the patient is taking are: Janumet twice a day, Invokana 100 mg daily       He was given a trial of Invokana and 01/2015 because of his persistently high blood sugars including fasting despite taking relatively large insulin doses and Janumet XR   Now taking TRESIBA, previously taking 100 units of Levemir  His A1c is still relatively high  at 7.2, previously 7.5  Current blood sugar patterns, management and problems identified:    Because of his high fasting readings he was told to increase Tresiba by 10 units but he has done only 5 units  Also he was told to increase his  Invokana to 300 mg but he still taking 100 by mistake.  However he thinks that he has increased urination from Invokana and does not want to increase the dose .  He does not remember if blood sugars were better for a month he was taking 300 mg  He has variable readings in the mornings but again mostly high  Not clear if his sugars are high after supper , has done only one reading at 6 PM which was 183  He does not adjust his insulin based on fasting blood  sugar patterns  Despite taking Invokana his weight has gone up a little again  He thinks he is a little more active at not doing any formal exercise, playing some golf and gardening  He did feel hypoglycemic once but had not eaten breakfast or dinner from the night before  Side effects from medications have been: Polyuria from Invokana  Hypoglycemia: None    Glucose monitoring:  done less than once  in a day         Glucometer: One Touch.      Blood Glucose readings from download:   Mean values apply above for all meters except median for One Touch  PRE-MEAL Fasting Lunch Dinner Bedtime Overall  Glucose range: 117-219   183     Mean/median: 167         Self-care: The diet that the patient has been following is: tries to limit carbs.     Meal times: Breakfast: 9 AM Lunch: 1 PM Dinner: 6 PM   Typical meal intake:Lunch is a sandwich or vegetables, variable meal at suppertime.  For snacks will have popcorn               Dietician visit, most recent: 6/16  Exercise:  Some general activity as above   Weight history: Highest  previously 256  Wt Readings from Last 3 Encounters:  12/23/15 215 lb (97.523 kg)  12/03/15 216 lb 6.4 oz (98.158 kg)  09/10/15 213 lb 12.8 oz (96.979 kg)    Glycemic control:   Lab Results  Component Value Date   HGBA1C 7.2* 12/16/2015   HGBA1C 7.5 09/10/2015   HGBA1C 7.6* 04/15/2015   Lab Results  Component Value Date   MICROALBUR 1.4 11/20/2014   LDLCALC 64 04/25/2014   CREATININE 0.61 12/16/2015         Medication List       This list is accurate as of: 12/23/15 10:04 AM.  Always use your most recent med list.               aspirin 81 MG tablet  Take 81 mg by mouth daily.     atorvastatin 20 MG tablet  Commonly known as:  LIPITOR  TAKE 1 TABLET (20 MG TOTAL) BY MOUTH DAILY.     glucose blood test strip  One touch ultra blue test strip. Test blood sugar once daily. Dx code:E11.09.     Insulin Pen Needle 32G X 6  MM Misc  Commonly known as:  NOVOFINE  USE ONCE DAILY WITH LEVIMIR INJECTION     INVOKANA 100 MG Tabs tablet  Generic drug:  canagliflozin  TAKE 1 TABLET BY MOUTH EVERY DAY BEFORE BREAKFAST     JANUMET XR 50-1000 MG Tb24  Generic drug:  SitaGLIPtin-MetFORMIN HCl  TAKE 2 TABLETS BY MOUTH EVERY DAY     metFORMIN 500 MG 24 hr tablet  Commonly known as:  GLUCOPHAGE-XR  Take 4 tablets (2,000 mg total) by mouth daily with supper.     ONETOUCH DELICA LANCETS 33G Misc  Test blood sugar once daily. Dx code: E11.09     ramipril 2.5 MG capsule  Commonly known as:  ALTACE  Take 1 capsule (2.5 mg total) by mouth daily.     TRESIBA FLEXTOUCH 200 UNIT/ML Sopn  Generic drug:  Insulin Degludec  INJECT 106 UNITS INTO THE SKIN DAILY.     VASCEPA 1 g Caps  Generic drug:  Icosapent Ethyl  Take 1 g by mouth daily.     VICTOZA 18 MG/3ML Sopn  Generic drug:  Liraglutide  Inject 0.2 mLs (1.2 mg total) into the skin daily. Inject once daily at the same time        Allergies:  Allergies  Allergen Reactions  . Rosuvastatin Other (See Comments)    REACTION: myalgias    Past Medical History  Diagnosis Date  . Food poisoning     age 56  . Rash   . Hypertension   . Hyperlipidemia   . Diabetes mellitus   . CAD (coronary artery disease)     a. 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 b. s/p CABG 04/2011.  [name] Aortic valve sclerosis     echo june 2009  . PVC (premature ventricular contraction) 06/2009    November, 2010  . GERD (gastroesophageal reflux disease)   . Muscle ache     on Crestor  09/04/09 CPK 193(7-232) tolerated simvastatin in the past  . Ejection fraction     a. Ejection fraction 60%, echo, June, 2009. b. EF preserved by cath 2012.  [name] Hx of CABG     [name],  x4  May 01, 2011 limited to the LAD, left radial artery to  the OM, SVG to diagonal, SVG to posterior descending  . Carotid artery disease (HCC)     Doppler, preop, September,  2012,  mild bilateral plaque with no significant stenoses  . Dizziness     June, 2013    Past Surgical History  Procedure Laterality Date  . Coccyx bone removed    . Pilonidal cystectomy    . Back surgery    . Intraoperative transesophageal echocardiography.  05/01/2011  . Coronary artery bypass graft  05/01/2011    CABG x4 with LIMA to LAD, LRA to OM, SVG to D1, SVG to PDA, EVH via left thigh    Family History  Problem Relation Age of Onset  . Hypertension Father   . Heart attack Father 65    bypass  . Diabetes Father   . Hearing loss Father   . Thyroid disease Father   . Heart attack Paternal Grandfather     late 43s  . Hearing loss Paternal Grandfather   . Cancer Maternal Grandfather     site unknown  . Prostate cancer Maternal Grandfather   . Diabetes Paternal Aunt     X 3  . Heart failure Maternal Grandmother   . Diabetes Mother   . Diabetes Paternal Uncle   . Stroke Neg Hx     Social History:  reports that he has never smoked. He does not have any smokeless tobacco history on file. He reports that he does not drink alcohol or use illicit drugs.    Review of Systems    Lipid history: He has been advised to take Vascepa for significantly high triglycerides along with his Lipitor  Triglycerides are better    Lab Results  Component Value Date   CHOL 114 12/16/2015   HDL 27.60* 12/16/2015   LDLCALC 64 04/25/2014   LDLDIRECT 61.0 12/16/2015   TRIG 278.0* 12/16/2015   CHOLHDL 4 12/16/2015            THYROID: He has a 2.4 cm dominant left-sided thyroid nodule on Ultrasound in 12/2014 but he did not get his Follow-up ultrasound done  HYPERTENSION: Taking  2.5 mg ramipril, Controlled   LABS:  No visits with results within 1 Week(s) from this visit. Latest known visit with results is:  Lab on 12/16/2015  Component Date Value Ref Range Status  . Hgb A1c MFr Bld 12/16/2015 7.2* 4.6 - 6.5 % Final   Glycemic Control Guidelines for People with Diabetes:Non  Diabetic:  <6%Goal of Therapy: <7%Additional Action Suggested:  >8%   . Sodium 12/16/2015 138  135 - 145 mEq/L Final  . Potassium 12/16/2015 4.0  3.5 - 5.1 mEq/L Final  . Chloride 12/16/2015 105  96 - 112 mEq/L Final  . CO2 12/16/2015 26  19 - 32 mEq/L Final  . Glucose, Bld 12/16/2015 225* 70 - 99 mg/dL Final  . BUN 16/05/9603 10  6 - 23 mg/dL Final  . Creatinine, Ser 12/16/2015 0.61  0.40 - 1.50 mg/dL Final  . Total Bilirubin 12/16/2015 0.6  0.2 - 1.2 mg/dL Final  . Alkaline Phosphatase 12/16/2015 34* 39 - 117 U/L Final  . AST 12/16/2015 15  0 - 37 U/L Final  . ALT 12/16/2015 21  0 - 53 U/L Final  . Total Protein 12/16/2015 6.7  6.0 - 8.3 g/dL Final  . Albumin 54/04/8118 4.3  3.5 - 5.2 g/dL Final  . Calcium 14/78/2956 9.0  8.4 - 10.5 mg/dL Final  . GFR 21/30/8657 141.69  >60.00 mL/min Final  .  Cholesterol 12/16/2015 114  0 - 200 mg/dL Final   ATP III Classification       Desirable:  < 200 mg/dL               Borderline High:  200 - 239 mg/dL          High:  > = 161 mg/dL  . Triglycerides 12/16/2015 278.0* 0.0 - 149.0 mg/dL Final   Normal:  <096 mg/dLBorderline High:  150 - 199 mg/dL  . HDL 12/16/2015 27.60* >39.00 mg/dL Final  . VLDL 04/54/0981 55.6* 0.0 - 40.0 mg/dL Final  . Total CHOL/HDL Ratio 12/16/2015 4   Final                  Men          Women1/2 Average Risk     3.4          3.3Average Risk          5.0          4.42X Average Risk          9.6          7.13X Average Risk          15.0          11.0                      . NonHDL 12/16/2015 86.19   Final   NOTE:  Non-HDL goal should be 30 mg/dL higher than patient's LDL goal (i.e. LDL goal of < 70 mg/dL, would have non-HDL goal of < 100 mg/dL)  . Direct LDL 12/16/2015 61.0   Final   Optimal:  <100 mg/dLNear or Above Optimal:  100-129 mg/dLBorderline High:  130-159 mg/dLHigh:  160-189 mg/dLVery High:  >190 mg/dL    Physical Examination:  BP 126/78 mmHg  Pulse 63  Temp(Src) 98 F (36.7 C)  Resp 14  Ht  (1.753 m)  Wt  215 lb (97.523 kg)  BMI 31.74 kg/m2  SpO2 96%     ASSESSMENT:  Diabetes type 2, uncontrolled with persistently high A1c    See history of present illness for detailed discussion of his current management, blood sugar patterns and problems identified  He still does not monitor his blood sugars after meals as directed Only on basal insulin and not clear if he has high postprandial readings He does not want to increase his Invokana because of polyuria  A1c is still over 7% Has gained a little weight again  HYPERLIPIDEMIA: He has high triglycerides, elderly better  THYROID nodule: Needs follow-up ultrasound, will have him schedule  HYPERTENSION: Blood pressure is well controlled with 2.5 mg ramipril  PLAN:  Change Januvia to Victoza. Discussed with the patient the nature of GLP-1 drugs, the actions on various organ systems, how they benefit blood glucose control, as well as the benefit of weight loss and  increase satiety . Explained possible side effects especially nausea and vomiting initially; discussed safety information in package insert.  Described the injection technique and dosage titration of Victoza  starting with 0.6 mg once a day at the same time for the first week and then increasing to 1.2 mg if no symptoms of nausea.  Educational brochure on Victoza and co-pay card given  He may need to reduce his Evaristo Bury and discussed how to titrate this based on fasting readings  Change Janumet to metformin ER, discussed the differences  More readings after meals  More consistent exercise and diet  Patient Instructions  Check blood sugars on waking up 3-4  times a week Also check blood sugars about 2 hours after a meal and do this after different meals by rotation  Recommended blood sugar levels on waking up is 90-130 and about 2 hours after meal is 130-160  Please bring your blood sugar monitor to each visit, thank you  Start VICTOZA injection as shown once daily at the same  time of the day.  Dial the dose to 0.6 mg on the pen for the first week.  You may inject in the stomach, thigh or arm. You may experience nausea in the first few days which usually goes away.  You will feel fullness of the stomach with starting the medication and should try to keep the portions at meals small. After 1 week increase the dose to 1.2mg  daily if no nausea present.   If any questions or concerns are present call the office or the [facility] helpline at [phone]. Visit [email or web address] for more useful information  Try reducing Tresiba weekly when sugar stays <120 in am      Counseling time on subjects discussed above is over 50% of today's 25 minute visit  [name] [name] 12/23/2015, 10:04 AM   Note: This office note was prepared with [employer] underwriter[employer]agon voice recognition system technology. Any transcriptional errors that result from this process are unintentional.

## 2015-12-23 NOTE — Patient Instructions (Signed)
Check blood sugars on waking up 3-4  times a week Also check blood sugars about 2 hours after a meal and do this after different meals by rotation  Recommended blood sugar levels on waking up is 90-130 and about 2 hours after meal is 130-160  Please bring your blood sugar monitor to each visit, thank you  Start VICTOZA injection as shown once daily at the same time of the day.  Dial the dose to 0.6 mg on the pen for the first week.  You may inject in the stomach, thigh or arm. You may experience nausea in the first few days which usually goes away.  You will feel fullness of the stomach with starting the medication and should try to keep the portions at meals small. After 1 week increase the dose to 1.2mg  daily if no nausea present.   If any questions or concerns are present call the office or the [facility] helpline at [phone]. Visit [email or web address] for more useful information  Try reducing Tresiba weekly when sugar stays <120 in am

## 2015-12-30 ENCOUNTER — Other Ambulatory Visit: Payer: Self-pay | Admitting: Endocrinology

## 2016-01-15 ENCOUNTER — Other Ambulatory Visit (INDEPENDENT_AMBULATORY_CARE_PROVIDER_SITE_OTHER): Payer: BLUE CROSS/BLUE SHIELD

## 2016-01-15 DIAGNOSIS — Z794 Long term (current) use of insulin: Secondary | ICD-10-CM

## 2016-01-15 DIAGNOSIS — E1165 Type 2 diabetes mellitus with hyperglycemia: Secondary | ICD-10-CM | POA: Diagnosis not present

## 2016-01-15 LAB — GLUCOSE, RANDOM: Glucose, Bld: 185 mg/dL — ABNORMAL HIGH (ref 70–99)

## 2016-01-16 LAB — FRUCTOSAMINE: FRUCTOSAMINE: 311 umol/L — AB (ref 0–285)

## 2016-01-22 ENCOUNTER — Encounter: Payer: Self-pay | Admitting: Endocrinology

## 2016-01-22 ENCOUNTER — Other Ambulatory Visit: Payer: Self-pay | Admitting: Endocrinology

## 2016-01-22 ENCOUNTER — Ambulatory Visit (INDEPENDENT_AMBULATORY_CARE_PROVIDER_SITE_OTHER): Payer: BLUE CROSS/BLUE SHIELD | Admitting: Endocrinology

## 2016-01-22 VITALS — BP 151/81 | HR 73 | Ht 69.0 in | Wt 217.0 lb

## 2016-01-22 DIAGNOSIS — E041 Nontoxic single thyroid nodule: Secondary | ICD-10-CM | POA: Diagnosis not present

## 2016-01-22 DIAGNOSIS — Z794 Long term (current) use of insulin: Secondary | ICD-10-CM | POA: Diagnosis not present

## 2016-01-22 DIAGNOSIS — E1165 Type 2 diabetes mellitus with hyperglycemia: Secondary | ICD-10-CM | POA: Diagnosis not present

## 2016-01-22 MED ORDER — VICTOZA 18 MG/3ML ~~LOC~~ SOPN: PEN_INJECTOR | SUBCUTANEOUS | Status: DC

## 2016-01-22 NOTE — Patient Instructions (Addendum)
Check blood sugars on waking up 3  times a week Also check blood sugars about 2 hours after a meal and do this after different meals by rotation  Recommended blood sugar levels on waking up is 90-130 and about 2 hours after meal is 130-160  Please bring your blood sugar monitor to each visit, thank you  Invokana in am  Victoza 1.8 mg daily  Tresiba 120 units, go down when am sugar <120  Walk daily

## 2016-01-22 NOTE — Progress Notes (Signed)
Patient ID: [name] [name], male   DOB: July 12, 1951, 65 y.o.   MRN: [id number]           Reason for Appointment: Follow-up for Type 2 Diabetes   History of Present Illness:          Date of diagnosis of type 2 diabetes mellitus: 2011        Background history:  He was probably started on metformin at diagnosis and subsequently given Amaryl also Subsequently had been on Janumet and more [facility]. Because of poor control he was given Levemir in addition to his Kombiglyze in 01/2013 The dose was progressively increased from initial dose of 12 units He did have somewhat better control right after starting insulin with A1c below 7% but only one time His blood sugars had been consistently high since 2014 with A1c at least 8%  Recent history:   INSULIN regimen is described as: TRESIBA 110   units at bedtime   hypoglycemic drugs the patient is taking are: Metformin ER 2 g, Invokana 100 mg daily       He was given a trial of Invokana in 01/2015 because of his persistently high blood sugars including fasting despite taking relatively large insulin doses and Janumet XR   Now taking TRESIBA, previously taking 100 units of Levemir  His A1c is still relatively high  at 7.2 on the last visit  Since his fasting readings are consistently high he was told to start Victoza which he has done and is taking 1.2 mg daily without side effects.  He does have a little less hunger with this but has not lost any weight  Current blood sugar patterns, management and problems identified:    Because of symptoms of dizziness right after his last visit he stopped taking his Invokana but did not notify us  His blood sugars appear to be relatively higher  He has however done only one reading at night which was 260, he thinks this is from not watching his diet.  He is not checking his blood sugars after meals as he thinks he is not eating the proper meal in the evening and mostly  frequent snacks  He thinks he is generally active but not doing any formal exercise, playing some golf and gardening  He did feel hypoglycemic once but had not eaten breakfast or dinner from the night before  Side effects from medications have been: Polyuria from Invokana  Hypoglycemia: None    Glucose monitoring:  done less than once  in a day         Glucometer: One Touch.      Blood Glucose readings from download:   Mean values apply above for all meters except median for One Touch  PRE-MEAL Fasting Lunch Dinner Bedtime Overall  Glucose range: 139-211    260    Mean/median:     194     Self-care: The diet that the patient has been following is: tries to limit carbs.     Meal times: Breakfast: 9 AM Lunch: 1 PM Dinner: 6 PM   Typical meal intake:Lunch is a sandwich or vegetables, variable meal at suppertime.  For snacks will have popcorn               Dietician visit, most recent: 6/16               Exercise:  Some general activity     Weight history: Highest  previously 256  Wt Readings from Last 3 Encounters:  01/22/16 217 lb (98.431 kg)  12/23/15 215 lb (97.523 kg)  12/03/15 216 lb 6.4 oz (98.158 kg)    Glycemic control:   Lab Results  Component Value Date   HGBA1C 7.2* 12/16/2015   HGBA1C 7.5 09/10/2015   HGBA1C 7.6* 04/15/2015   Lab Results  Component Value Date   MICROALBUR 1.4 11/20/2014   LDLCALC 64 04/25/2014   CREATININE 0.61 12/16/2015         Medication List       This list is accurate as of: 01/22/16 10:01 AM.  Always use your most recent med list.               aspirin 81 MG tablet  Take 81 mg by mouth daily.     atorvastatin 20 MG tablet  Commonly known as:  LIPITOR  TAKE 1 TABLET (20 MG TOTAL) BY MOUTH DAILY.     glucose blood test strip  One touch ultra blue test strip. Test blood sugar once daily. Dx code:E11.09.     Insulin Pen Needle 32G X 6 MM Misc  Commonly known as:  NOVOFINE  USE ONCE DAILY WITH LEVIMIR INJECTION       INVOKANA 100 MG Tabs tablet  Generic drug:  canagliflozin  TAKE 1 TABLET BY MOUTH EVERY DAY BEFORE BREAKFAST     metFORMIN 500 MG 24 hr tablet  Commonly known as:  GLUCOPHAGE-XR  Take 4 tablets (2,000 mg total) by mouth daily with supper.     ONETOUCH DELICA LANCETS 33G Misc  Test blood sugar once daily. Dx code: E11.09     ramipril 2.5 MG capsule  Commonly known as:  ALTACE  Take 1 capsule (2.5 mg total) by mouth daily.     TRESIBA FLEXTOUCH 200 UNIT/ML Sopn  Generic drug:  Insulin Degludec  INJECT 106 UNITS INTO THE SKIN DAILY.     VASCEPA 1 g Caps  Generic drug:  Icosapent Ethyl  Take 1 g by mouth daily.     VICTOZA 18 MG/3ML Sopn  Generic drug:  Liraglutide  Inject 0.2 mLs (1.2 mg total) into the skin daily. Inject once daily at the same time        Allergies:  Allergies  Allergen Reactions  . Rosuvastatin Other (See Comments)    REACTION: myalgias    Past Medical History  Diagnosis Date  . Food poisoning     age 25  . Rash   . Hypertension   . Hyperlipidemia   . Diabetes mellitus   . CAD (coronary artery disease)     a. 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 b. s/p CABG 04/2011.  [name] Aortic valve sclerosis     echo june 2009  . PVC (premature ventricular contraction) 06/2009    November, 2010  . GERD (gastroesophageal reflux disease)   . Muscle ache     on Crestor  09/04/09 CPK 193(7-232) tolerated simvastatin in the past  . Ejection fraction     a. Ejection fraction 60%, echo, June, 2009. b. EF preserved by cath 2012.  [name] Hx of CABG     [name] [name],  x4  May 01, 2011 limited to the LAD, left radial artery to the OM, SVG to diagonal, SVG to posterior descending  . Carotid artery disease (HCC)     Doppler, preop, September, 2012,  mild bilateral plaque with no significant stenoses  . Dizziness     June, 2013  Past Surgical History  Procedure Laterality Date  . Coccyx bone removed    . Pilonidal cystectomy     . Back surgery    . Intraoperative transesophageal echocardiography.  05/01/2011  . Coronary artery bypass graft  05/01/2011    CABG x4 with LIMA to LAD, LRA to OM, SVG to D1, SVG to PDA, EVH via left thigh    Family History  Problem Relation Age of Onset  . Hypertension Father   . Heart attack Father 65    bypass  . Diabetes Father   . Hearing loss Father   . Thyroid disease Father   . Heart attack Paternal Grandfather     late 31s  . Hearing loss Paternal Grandfather   . Cancer Maternal Grandfather     site unknown  . Prostate cancer Maternal Grandfather   . Diabetes Paternal Aunt     X 3  . Heart failure Maternal Grandmother   . Diabetes Mother   . Diabetes Paternal Uncle   . Stroke Neg Hx     Social History:  reports that he has never smoked. He does not have any smokeless tobacco history on file. He reports that he does not drink alcohol or use illicit drugs.    Review of Systems    He had dizziness or about 2 weeks soon after his last visit which was mostly a swimmy headed feeling.  He did not report this and stopped his Invokana on his own  Lipid history: He has been advised to take Vascepa for significantly high triglycerides along with his Lipitor  Triglycerides are better    Lab Results  Component Value Date   CHOL 114 12/16/2015   HDL 27.60* 12/16/2015   LDLCALC 64 04/25/2014   LDLDIRECT 61.0 12/16/2015   TRIG 278.0* 12/16/2015   CHOLHDL 4 12/16/2015            THYROID: He has a 2.4 cm dominant left-sided thyroid nodule on Ultrasound in 12/2014 but he Still did not get his Follow-up ultrasound done despite reminders on the last visit  HYPERTENSION: Taking  2.5 mg ramipril, Controlled   LABS:  No visits with results within 1 Week(s) from this visit. Latest known visit with results is:  Lab on 01/15/2016  Component Date Value Ref Range Status  . Fructosamine 01/15/2016 311* 0 - 285 umol/L Final   Comment: Published reference interval for  apparently healthy subjects between age 11 and 57 is 18 - 285 umol/L and in a poorly controlled diabetic population is 228 - 563 umol/L with a mean of 396 umol/L.   [name] Glucose, Bld 01/15/2016 185* 70 - 99 mg/dL Final    Physical Examination:  BP 151/81 mmHg  Pulse 73  Ht  (1.753 m)  Wt 217 lb (98.431 kg)  BMI 32.03 kg/m2     ASSESSMENT:  Diabetes type 2, uncontrolled with persistently high A1c    See history of present illness for detailed discussion of  current management, blood sugar patterns and problems identified  His blood sugars are relatively higher with stopping Invokana which he did by mistake and not letting us know about doing this.  He thought it was causing dizziness but apparently was having vertigo  Not clear if he is benefiting from Victoza 1.2 mg daily which was started Fructosamine is relatively high at 311  He still does not monitor his blood sugars after meals as directed despite reminders Only on basal insulin and not clear if he has  high postprandial readings, has only one high reading in the evening which he thinks is from poor diet He does not want to increase his Invokana dosage because of polyuria  Has gained a little weight again  THYROID nodule: Needs follow-up ultrasound, will reorder  HYPERTENSION: Blood pressure is usually well controlled with 2.5 mg ramipril, higher today from his running out of the medication for 3 days as well as not taking Invokana  PLAN:   Restart Invokana  Increase Victoza 1.8  Increase to see by Evaristo Bury by 10 units for now  Consider mealtime insulin if blood sugars are consistently high after lunch or supper  Start checking blood sugar readings after meals  More consistent exercise and diet.  He can start walking at least on the days he is not very active  Patient Instructions  Check blood sugars on waking up 3  times a week Also check blood sugars about 2 hours after a meal and do this after different  meals by rotation  Recommended blood sugar levels on waking up is 90-130 and about 2 hours after meal is 130-160  Please bring your blood sugar monitor to each visit, thank you  Invokana in am  Victoza 1.8 mg daily  Tresiba 120 units, go down when am sugar <120  Walk daily       Counseling time on subjects discussed above is over 50% of today's 25 minute visit  [name] [name] 01/22/2016, 10:01 AM   Note: This office note was prepared with Insurance underwriter. Any transcriptional errors that result from this process are unintentional.

## 2016-01-29 ENCOUNTER — Other Ambulatory Visit: Payer: Self-pay | Admitting: Endocrinology

## 2016-02-05 ENCOUNTER — Ambulatory Visit
Admission: RE | Admit: 2016-02-05 | Discharge: 2016-02-05 | Disposition: A | Discharge status: Home / Self Care | Payer: BLUE CROSS/BLUE SHIELD | Source: Ambulatory Visit | Attending: Endocrinology | Admitting: Endocrinology

## 2016-02-05 DIAGNOSIS — E041 Nontoxic single thyroid nodule: Secondary | ICD-10-CM

## 2016-03-04 ENCOUNTER — Ambulatory Visit (INDEPENDENT_AMBULATORY_CARE_PROVIDER_SITE_OTHER): Payer: BLUE CROSS/BLUE SHIELD | Admitting: Endocrinology

## 2016-03-04 VITALS — BP 126/80 | HR 78 | Ht 69.0 in | Wt 217.0 lb

## 2016-03-04 DIAGNOSIS — Z794 Long term (current) use of insulin: Secondary | ICD-10-CM

## 2016-03-04 DIAGNOSIS — E1165 Type 2 diabetes mellitus with hyperglycemia: Secondary | ICD-10-CM

## 2016-03-04 LAB — POCT GLYCOSYLATED HEMOGLOBIN (HGB A1C): Hemoglobin A1C: 7.4

## 2016-03-04 MED ORDER — CANAGLIFLOZIN 100 MG PO TABS: ORAL_TABLET | ORAL | 1 refills | Status: DC

## 2016-03-04 NOTE — Addendum Note (Signed)
Addended by: BAILEY, MEGAN T on: 03/04/2016 12:03 PM   Modules accepted: Orders  

## 2016-03-04 NOTE — Patient Instructions (Addendum)
Stop Ramipril and take 1/2 Invokana, after 3 days try 1 pill   If am stays >140, then go to 115 Tresiba  Check blood sugars on waking up    Also check blood sugars about 2 hours after a meal and do this after different meals by rotation  Recommended blood sugar levels on waking up is 90-130 and about 2 hours after meal is 130-160  Please bring your blood sugar monitor to each visit, thank you  More walking

## 2016-03-04 NOTE — Progress Notes (Signed)
Patient ID: [name] [name], male   DOB: 03/11/1951, 65 y.o.   MRN: [id number]           Reason for Appointment: Follow-up for Type 2 Diabetes   History of Present Illness:          Date of diagnosis of type 2 diabetes mellitus: 2011        Background history:  He was probably started on metformin at diagnosis and subsequently given Amaryl also Subsequently had been on Janumet and more recently this was changed to [facility]. Because of poor control he was given Levemir in addition to his Kombiglyze in 01/2013 The dose was progressively increased from initial dose of 12 units He did have somewhat better control right after starting insulin with A1c below 7% but only one time His blood sugars had been consistently high since 2014 with A1c at least 8%  Recent history:   INSULIN regimen is described as: TRESIBA 110   units at bedtime   Non-insulin hypoglycemic drugs the patient is taking are: Metformin ER 2 g, Victoza 1.8 mg daily   His A1c is still relatively high  at 7.4 now   Current blood sugar patterns, management and problems identified:  He was told to try taking Invokana again as he previously had what appeared to be an episode of vertigo.  However he says he again got dizziness 2 days after starting this with symptom of feeling off balance on standing up and some symptoms while upright.  He felt better after stopping the Klonopin but did not notify our office  His blood sugars appear to be still not well-controlled  He was told to increase his [name] to 120 but has not done so  Weight is about the same  Not clear if he is benefiting from Victoza 1.8 mg as his postprandial readings are frequently over 200  However monitoring somewhat infrequently overall  Fasting blood sugars are consistently over 140  Side effects from medications have been: ?  Dizziness from Invokana  Hypoglycemia: None    Glucose monitoring:  done less than once  in a day         Glucometer:  One Touch.      Blood Glucose readings from download:   Mean values apply above for all meters except median for One Touch  PRE-MEAL Fasting Lunch Dinner Bedtime Overall  Glucose range: 148-178       Mean/median: 156     176    POST-MEAL PC Breakfast PC Lunch PC Dinner  Glucose range: 161-289  202, 245  240   Mean/median:       Self-care: The diet that the patient has been following is: tries to limit carbs.     Meal times: Breakfast: 9 AM Lunch: 1 PM Dinner: 6 PM   Typical meal intake:Lunch is a sandwich or vegetables, variable meal at suppertime.  For snacks will have popcorn               Dietician visit, most recent: 6/16               Exercise:  Some walking     Weight history: Highest  previously 256  Wt Readings from Last 3 Encounters:  03/04/16 217 lb (98.4 kg)  01/22/16 217 lb (98.4 kg)  12/23/15 215 lb (97.5 kg)    Glycemic control:   Lab Results  Component Value Date   HGBA1C 7.2 (H) 12/16/2015   HGBA1C 7.5 09/10/2015   HGBA1C  7.6 (H) 04/15/2015   Lab Results  Component Value Date   MICROALBUR 1.4 11/20/2014   LDLCALC 64 04/25/2014   CREATININE 0.61 12/16/2015         Medication List       Accurate as of 03/04/16  9:26 AM. Always use your most recent med list.          aspirin 81 MG tablet Take 81 mg by mouth daily.   atorvastatin 20 MG tablet Commonly known as:  LIPITOR TAKE 1 TABLET (20 MG TOTAL) BY MOUTH DAILY.   glucose blood test strip One touch ultra blue test strip. Test blood sugar once daily. Dx code:E11.09.   Insulin Pen Needle 32G X 6 MM Misc Commonly known as:  NOVOFINE USE ONCE DAILY WITH LEVIMIR INJECTION   INVOKANA 100 MG Tabs tablet Generic drug:  canagliflozin TAKE 1 TABLET BY MOUTH EVERY DAY BEFORE BREAKFAST   metFORMIN 500 MG 24 hr tablet Commonly known as:  GLUCOPHAGE-XR Take 4 tablets (2,000 mg total) by mouth daily with supper.   ONETOUCH DELICA LANCETS 33G Misc Test blood sugar once daily. Dx code:  E11.09   ramipril 2.5 MG capsule Commonly known as:  ALTACE Take 1 capsule (2.5 mg total) by mouth daily.   TRESIBA FLEXTOUCH 200 UNIT/ML Sopn Generic drug:  Insulin Degludec INJECT 106 UNITS INTO THE SKIN DAILY.   VASCEPA 1 g Caps Generic drug:  Icosapent Ethyl Take 1 g by mouth daily.   VICTOZA 18 MG/3ML Sopn Generic drug:  Liraglutide Inject 1.8 mg once daily at the same time       Allergies:  Allergies  Allergen Reactions  . Rosuvastatin Other (See Comments)    REACTION: myalgias    Past Medical History:  Diagnosis Date  . Aortic valve sclerosis    echo june 2009  . CAD (coronary artery disease)    a. 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 b. s/p CABG 04/2011.  . Carotid artery disease (HCC)    Doppler, preop, September, 2012,  mild bilateral plaque with no significant stenoses  . Diabetes mellitus   . Dizziness    June, 2013  . Ejection fraction    a. Ejection fraction 60%, echo, June, 2009. b. EF preserved by cath 2012.  [name] Food poisoning    age 39  . GERD (gastroesophageal reflux disease)   . Hx of CABG    [name],  x4  May 01, 2011 limited to the LAD, left radial artery to the OM, SVG to diagonal, SVG to posterior descending  . Hyperlipidemia   . Hypertension   . Muscle ache    on Crestor  09/04/09 CPK 193(7-232) tolerated simvastatin in the past  . PVC (premature ventricular contraction) 06/2009   November, 2010  . Rash     Past Surgical History:  Procedure Laterality Date  . BACK SURGERY    . coccyx bone removed    . CORONARY ARTERY BYPASS GRAFT  05/01/2011   CABG x4 with LIMA to LAD, LRA to OM, SVG to D1, SVG to PDA, EVH via left thigh  . Intraoperative transesophageal echocardiography.  05/01/2011  . pilonidal cystectomy      Family History  Problem Relation Age of Onset  . Hypertension Father   . Heart attack Father 65    bypass  . Diabetes Father   . Hearing loss Father   . Thyroid disease  Father   . Heart attack Paternal Grandfather  late 72s  . Hearing loss Paternal Grandfather   . Cancer Maternal Grandfather     site unknown  . Prostate cancer Maternal Grandfather   . Diabetes Paternal Aunt     X 3  . Heart failure Maternal Grandmother   . Diabetes Mother   . Diabetes Paternal Uncle   . Stroke Neg Hx     Social History:  reports that he has never smoked. He does not have any smokeless tobacco history on file. He reports that he does not drink alcohol or use drugs.    Review of Systems   Lipid history: He has been advised to take Vascepa for significantly high triglycerides along with his Lipitor  Triglycerides are Over 200    Lab Results  Component Value Date   CHOL 114 12/16/2015   HDL 27.60 (L) 12/16/2015   LDLCALC 64 04/25/2014   LDLDIRECT 61.0 12/16/2015   TRIG 278.0 (H) 12/16/2015   CHOLHDL 4 12/16/2015            THYROID: He has a 2.4 cm dominant left-sided thyroid nodule on Ultrasound in 12/2014  Follow-up showed the same size of the nodules  HYPERTENSION: Taking  2.5 mg ramipril, Controlled, blood pressure was higher on his last visit   LABS:  No visits with results within 1 Week(s) from this visit.  Latest known visit with results is:  Lab on 01/15/2016  Component Date Value Ref Range Status  . Fructosamine 01/16/2016 311* 0 - 285 umol/L Final   Comment: Published reference interval for apparently healthy subjects between age 45 and 90 is 61 - 285 umol/L and in a poorly controlled diabetic population is 228 - 563 umol/L with a mean of 396 umol/L.   [name] Glucose, Bld 01/15/2016 185* 70 - 99 mg/dL Final    Physical Examination:  BP 126/80 (BP Location: Left Arm, Patient Position: Sitting, Cuff Size: Normal)   Pulse 78   Ht 5\' 9"  (1.753 m)   Wt 217 lb (98.4 kg)   SpO2 98%   BMI 32.05 kg/m      ASSESSMENT:  Diabetes type 2, uncontrolled with persistently high A1c    See history of present illness for detailed discussion of   current management, blood sugar patterns and problems identified  His blood sugars are still not well controlled with basal insulin, Victoza 1.8 mg and metformin A1c 7.4 and not improving He was tried on Invokana again but he thinks it made him dizzy, may have had relatively low blood pressure but he did not monitor this  Discussed that he likely needs to be taking mealtime insulin and discussed need for postprandial control and actions of rapid acting insulin in combination with basal insulin He is not wanting to consider mealtime insulin as yet.  He wants to try Invokana again and discussed how to start with smaller doses and holding of the ramipril  THYROID nodule: Stable  HYPERTENSION: Blood pressure is usually well controlled with 2.5 mg ramipril, he will stop this if continuing Invokana  PLAN:   Restart Invokana with half tablet and stop ramipril at the same time.  If no dizziness occurs he can increase the dose to 1 tablet  If fasting look go stays over 140 increase Invokana to at least 115  Start checking blood sugar readings more often, discussed blood sugar targets at various times  Discussed A1c targets  He can walk more often and for longer periods of time  Consistent diet  Follow-up in about  a month  There are no Patient Instructions on file for this visit.   Counseling time on subjects discussed above is over 50% of today's 25 minute visit  Adaja Wander 03/04/2016, 9:26 AM   Note: This office note was prepared with Insurance underwriter. Any transcriptional errors that result from this process are unintentional.

## 2016-04-15 ENCOUNTER — Ambulatory Visit: Payer: BLUE CROSS/BLUE SHIELD | Admitting: Endocrinology

## 2016-04-28 ENCOUNTER — Telehealth: Payer: Self-pay | Admitting: Endocrinology

## 2016-04-29 ENCOUNTER — Other Ambulatory Visit: Payer: Self-pay | Admitting: Endocrinology

## 2016-05-07 ENCOUNTER — Other Ambulatory Visit: Payer: Self-pay | Admitting: *Deleted

## 2016-05-07 MED ORDER — INSULIN DEGLUDEC 200 UNIT/ML ~~LOC~~ SOPN: 106.0000 [IU] | PEN_INJECTOR | Freq: Every day | SUBCUTANEOUS | 1 refills | Status: DC

## 2016-05-07 NOTE — Telephone Encounter (Signed)
rx sent

## 2016-05-07 NOTE — Telephone Encounter (Signed)
Patient need a refill  TRESIBA FLEXTOUCH 200 UNIT/ML SOPN  Send to [facility] in [place] phone # [phone]

## 2016-05-07 NOTE — Telephone Encounter (Signed)
Patient stated pharmacy haven't received medication  TRESIBA FLEXTOUCH 200 UNIT/ML SOPN. Could you please resend.

## 2016-05-08 ENCOUNTER — Encounter: Payer: Self-pay | Admitting: Endocrinology

## 2016-05-08 ENCOUNTER — Ambulatory Visit (INDEPENDENT_AMBULATORY_CARE_PROVIDER_SITE_OTHER): Payer: BLUE CROSS/BLUE SHIELD | Admitting: Endocrinology

## 2016-05-08 VITALS — BP 132/76 | HR 64 | Temp 97.8°F | Resp 16 | Ht 69.0 in | Wt 214.6 lb

## 2016-05-08 DIAGNOSIS — Z23 Encounter for immunization: Secondary | ICD-10-CM | POA: Diagnosis not present

## 2016-05-08 DIAGNOSIS — Z794 Long term (current) use of insulin: Secondary | ICD-10-CM | POA: Diagnosis not present

## 2016-05-08 DIAGNOSIS — E1165 Type 2 diabetes mellitus with hyperglycemia: Secondary | ICD-10-CM

## 2016-05-08 LAB — COMPREHENSIVE METABOLIC PANEL
ALT: 21 U/L (ref 0–53)
AST: 16 U/L (ref 0–37)
Albumin: 4.3 g/dL (ref 3.5–5.2)
Alkaline Phosphatase: 32 U/L — ABNORMAL LOW (ref 39–117)
BUN: 14 mg/dL (ref 6–23)
CALCIUM: 9.5 mg/dL (ref 8.4–10.5)
CHLORIDE: 105 meq/L (ref 96–112)
CO2: 27 meq/L (ref 19–32)
Creatinine, Ser: 0.68 mg/dL (ref 0.40–1.50)
GFR: 124.84 mL/min (ref >60.00)
Glucose, Bld: 118 mg/dL — ABNORMAL HIGH (ref 70–99)
POTASSIUM: 4.1 meq/L (ref 3.5–5.1)
Sodium: 140 mEq/L (ref 135–145)
Total Bilirubin: 0.7 mg/dL (ref 0.2–1.2)
Total Protein: 7.1 g/dL (ref 6.0–8.3)

## 2016-05-08 LAB — MICROALBUMIN / CREATININE URINE RATIO
Creatinine,U: 144.5 mg/dL
MICROALB/CREAT RATIO: 0.8 mg/g (ref 0.0–30.0)
Microalb, Ur: 1.1 mg/dL (ref 0.0–1.9)

## 2016-05-08 MED ORDER — CANAGLIFLOZIN 300 MG PO TABS: 300.0000 mg | ORAL_TABLET | Freq: Every day | ORAL | 3 refills | Status: DC

## 2016-05-08 NOTE — Progress Notes (Signed)
Patient ID: [name] [name], male   DOB: January 07, 1951, 65 y.o.   MRN: [id number]           Reason for Appointment: Follow-up for Type 2 Diabetes   History of Present Illness:          Date of diagnosis of type 2 diabetes mellitus: 2011        Background history:  He was probably started on metformin at diagnosis and subsequently given Amaryl also Subsequently had been on Janumet and more recently this was changed to [facility]. Because of poor control he was given Levemir in addition to his Kombiglyze in 01/2013 The dose was progressively increased from initial dose of 12 units He did have somewhat better control right after starting insulin with A1c below 7% but only one time His blood sugars had been consistently high since 2014 with A1c at least 8%  Recent history:   INSULIN regimen is described as: TRESIBA 106   units at bedtime   Non-insulin hypoglycemic drugs the patient is taking are: Metformin ER 2 g, Victoza 1.8 mg daily   His A1c is still relatively high  at 7.4 in August and he was restarted on Invokana  Current blood sugar patterns, management and problems identified:  He was able to restart Invokana without any dizziness and also left off his ramipril is advised  With this his blood sugars appear to be relatively better but still averaging the same in the morning  He has not done any readings except once after meal, had a reading of 120 at bedtime  Also has not done any readings for about a week, glucose was 134 in the office today  He thinks he is able to walk more consistently now and this helps her sugars  He was told to increase his [name] but he has reduced the dose by 4 units  Weight is about 3 pounds less  Not clear if he is benefiting from Victoza 1.8 mg as his postprandial readings are frequently over 200  Side effects from medications have been: ?  Dizziness from Invokana  Hypoglycemia: None    Glucose monitoring:  done less than once  in a day          Glucometer: One Touch.      Blood Glucose readings from download:   Mean values apply above for all meters except median for One Touch  PRE-MEAL Fasting Lunch Dinner Bedtime Overall  Glucose range: 114-174   120   Mean/median: 150       Self-care: The diet that the patient has been following is: tries to limit carbs.     Meal times: Breakfast: 9 AM Lunch: 1 PM Dinner: 6 PM   Typical meal intake:Lunch is a sandwich or vegetables, variable meal at suppertime.  For snacks will have popcorn               Dietician visit, most recent: 6/16               Exercise:  walking upto 90 min 4-5/7      Weight history: Highest  previously 256  Wt Readings from Last 3 Encounters:  05/08/16 214 lb 9.6 oz (97.3 kg)  03/04/16 217 lb (98.4 kg)  01/22/16 217 lb (98.4 kg)    Glycemic control:   Lab Results  Component Value Date   HGBA1C 7.4 03/04/2016   HGBA1C 7.2 (H) 12/16/2015   HGBA1C 7.5 09/10/2015   Lab Results  Component Value Date  MICROALBUR 1.4 11/20/2014   LDLCALC 64 04/25/2014   CREATININE 0.61 12/16/2015         Medication List       Accurate as of 05/08/16 11:15 AM. Always use your most recent med list.          aspirin 81 MG tablet Take 81 mg by mouth daily.   atorvastatin 20 MG tablet Commonly known as:  LIPITOR TAKE 1 TABLET (20 MG TOTAL) BY MOUTH DAILY.   canagliflozin 300 MG Tabs tablet Commonly known as:  INVOKANA Take 1 tablet (300 mg total) by mouth daily before breakfast.   glucose blood test strip One touch ultra blue test strip. Test blood sugar once daily. Dx code:E11.09.   Insulin Degludec 200 UNIT/ML Sopn Commonly known as:  TRESIBA FLEXTOUCH Inject 106 Units into the skin daily.   Insulin Pen Needle 32G X 6 MM Misc Commonly known as:  NOVOFINE USE ONCE DAILY WITH LEVIMIR INJECTION   metFORMIN 500 MG 24 hr tablet Commonly known as:  GLUCOPHAGE-XR TAKE 4 TABLETS (2,000 MG TOTAL) BY MOUTH DAILY WITH SUPPER.   ONETOUCH DELICA  LANCETS 33G Misc Test blood sugar once daily. Dx code: E11.09   ramipril 2.5 MG capsule Commonly known as:  ALTACE Take 1 capsule (2.5 mg total) by mouth daily.   VASCEPA 1 g Caps Generic drug:  Icosapent Ethyl Take 1 g by mouth daily.   VICTOZA 18 MG/3ML Sopn Generic drug:  Liraglutide Inject 1.8 mg once daily at the same time       Allergies:  Allergies  Allergen Reactions  . Rosuvastatin Other (See Comments)    REACTION: myalgias    Past Medical History:  Diagnosis Date  . Aortic valve sclerosis    echo june 2009  . CAD (coronary artery disease)    a. 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 b. s/p CABG 04/2011.  . Carotid artery disease (HCC)    Doppler, preop, September, 2012,  mild bilateral plaque with no significant stenoses  . Diabetes mellitus   . Dizziness    June, 2013  . Ejection fraction    a. Ejection fraction 60%, echo, June, 2009. b. EF preserved by cath 2012.  [name] Food poisoning    age 3  . GERD (gastroesophageal reflux disease)   . Hx of CABG    [name],  x4  May 01, 2011 limited to the LAD, left radial artery to the OM, SVG to diagonal, SVG to posterior descending  . Hyperlipidemia   . Hypertension   . Muscle ache    on Crestor  09/04/09 CPK 193(7-232) tolerated simvastatin in the past  . PVC (premature ventricular contraction) 06/2009   November, 2010  . Rash     Past Surgical History:  Procedure Laterality Date  . BACK SURGERY    . coccyx bone removed    . CORONARY ARTERY BYPASS GRAFT  05/01/2011   CABG x4 with LIMA to LAD, LRA to OM, SVG to D1, SVG to PDA, EVH via left thigh  . Intraoperative transesophageal echocardiography.  05/01/2011  . pilonidal cystectomy      Family History  Problem Relation Age of Onset  . Hypertension Father   . Heart attack Father 65    bypass  . Diabetes Father   . Hearing loss Father   . Thyroid disease Father   . Heart attack Paternal Grandfather     late  40s  . Hearing loss Paternal Grandfather   .  Cancer Maternal Grandfather     site unknown  . Prostate cancer Maternal Grandfather   . Diabetes Paternal Aunt     X 3  . Heart failure Maternal Grandmother   . Diabetes Mother   . Diabetes Paternal Uncle   . Stroke Neg Hx     Social History:  reports that he has never smoked. He does not have any smokeless tobacco history on file. He reports that he does not drink alcohol or use drugs.    Review of Systems   Lipid history: He has been On Vascepa for significantly high triglycerides along with his Lipitor  Triglycerides are Over 200    Lab Results  Component Value Date   CHOL 114 12/16/2015   HDL 27.60 (L) 12/16/2015   LDLCALC 64 04/25/2014   LDLDIRECT 61.0 12/16/2015   TRIG 278.0 (H) 12/16/2015   CHOLHDL 4 12/16/2015            THYROID: He has a 2.4 cm dominant left-sided thyroid nodule on Ultrasound in 12/2014  Follow-up showed the same size of the nodules  HYPERTENSION: Taking  2.5 mg ramipril Previously, he thinks he will take his blood pressure almost daily and it is higher he will take a ramipril which is about twice a week now Recent readings: 140/86  LABS:  No visits with results within 1 Week(s) from this visit.  Latest known visit with results is:  Office Visit on 03/04/2016  Component Date Value Ref Range Status  . Hemoglobin A1C 03/04/2016 7.4   Final    Physical Examination:  BP 132/76   Pulse 64   Temp 97.8 F (36.6 C)   Resp 16   Ht 5\' 9"  (1.753 m)   Wt 214 lb 9.6 oz (97.3 kg)   SpO2 97%   BMI 31.69 kg/m      ASSESSMENT:  Diabetes type 2, uncontrolled with persistently high A1c    See history of present illness for detailed discussion of  current management, blood sugar patterns and problems identified  His blood sugars are probably better although difficult to assess since he has done only a few readings and only in the morning Will assess fructosamine Discussed need for checking readings  after meals as not clear if he has high postprandial readings Probably benefiting from Invokana and increased walking  HYPERTENSION: Blood pressure is now controlled with taking only occasional ramipril Continue to follow at home  PLAN:   Increase Invokana to 2 tablets of the 100  If tolerated may go up to 300 mg on the next prescription  Stop ramipril  More consistent monitoring at various times of the day, not just in the morning  Continue exercise  May need to adjust Tresiba further if fasting readings are not consistently and target range that was discussed   Patient Instructions  Check blood sugars on waking up  4/7  Also check blood sugars about 2 hours after a meal and do this after different meals by rotation  Recommended blood sugar levels on waking up is 90-130 and about 2 hours after meal is 130-160  Please bring your blood sugar monitor to each visit, thank you  Invokana 2 in am, leave off Ramipril           Counseling time on subjects discussed above is over 50% of today's 25 minute visit  Alnita Aybar 05/08/2016, 11:15 AM   Note: This office note was prepared with Insurance underwriterDragon voice recognition system technology. Any transcriptional errors that result  from this process are unintentional.

## 2016-05-08 NOTE — Patient Instructions (Addendum)
Check blood sugars on waking up  4/7  Also check blood sugars about 2 hours after a meal and do this after different meals by rotation  Recommended blood sugar levels on waking up is 90-130 and about 2 hours after meal is 130-160  Please bring your blood sugar monitor to each visit, thank you  Invokana 2 in am, leave off Ramipril

## 2016-05-09 LAB — FRUCTOSAMINE: FRUCTOSAMINE: 278 umol/L (ref 0–285)

## 2016-05-13 ENCOUNTER — Ambulatory Visit: Payer: BLUE CROSS/BLUE SHIELD | Admitting: Endocrinology

## 2016-05-18 ENCOUNTER — Other Ambulatory Visit: Payer: Self-pay | Admitting: *Deleted

## 2016-05-18 ENCOUNTER — Other Ambulatory Visit: Payer: Self-pay

## 2016-05-18 ENCOUNTER — Other Ambulatory Visit: Payer: Self-pay | Admitting: Cardiology

## 2016-05-18 MED ORDER — RAMIPRIL 2.5 MG PO CAPS: 2.5000 mg | ORAL_CAPSULE | Freq: Every day | ORAL | 0 refills | Status: DC

## 2016-05-18 MED ORDER — METFORMIN HCL ER 500 MG PO TB24: 2000.0000 mg | ORAL_TABLET | Freq: Every day | ORAL | 0 refills | Status: DC

## 2016-05-18 MED ORDER — VICTOZA 18 MG/3ML ~~LOC~~ SOPN: PEN_INJECTOR | SUBCUTANEOUS | 0 refills | Status: DC

## 2016-05-18 MED ORDER — ATORVASTATIN CALCIUM 20 MG PO TABS: ORAL_TABLET | ORAL | 6 refills | Status: DC

## 2016-05-19 ENCOUNTER — Other Ambulatory Visit: Payer: Self-pay

## 2016-05-19 ENCOUNTER — Other Ambulatory Visit: Payer: Self-pay | Admitting: Endocrinology

## 2016-05-19 MED ORDER — RAMIPRIL 2.5 MG PO CAPS: 2.5000 mg | ORAL_CAPSULE | Freq: Every day | ORAL | 1 refills | Status: DC

## 2016-05-19 MED ORDER — VICTOZA 18 MG/3ML ~~LOC~~ SOPN: PEN_INJECTOR | SUBCUTANEOUS | 0 refills | Status: DC

## 2016-07-07 ENCOUNTER — Ambulatory Visit: Payer: BLUE CROSS/BLUE SHIELD | Admitting: Endocrinology

## 2016-07-08 ENCOUNTER — Ambulatory Visit (INDEPENDENT_AMBULATORY_CARE_PROVIDER_SITE_OTHER): Payer: BLUE CROSS/BLUE SHIELD | Admitting: Endocrinology

## 2016-07-08 ENCOUNTER — Encounter: Payer: Self-pay | Admitting: Endocrinology

## 2016-07-08 VITALS — BP 138/80 | HR 75 | Ht 68.0 in | Wt 211.0 lb

## 2016-07-08 DIAGNOSIS — E1165 Type 2 diabetes mellitus with hyperglycemia: Secondary | ICD-10-CM

## 2016-07-08 LAB — POCT GLYCOSYLATED HEMOGLOBIN (HGB A1C): HEMOGLOBIN A1C: 6.6

## 2016-07-08 NOTE — Patient Instructions (Addendum)
Try 1/2 of 300mg  Invokana daily  More sugar testing after meals  Check blood sugars on waking up  3x weekly  Also check blood sugars about 2 hours after a meal and do this after different meals by rotation  Recommended blood sugar levels on waking up is 90-130 and about 2 hours after meal is 130-160  Please bring your blood sugar monitor to each visit, thank you

## 2016-07-08 NOTE — Progress Notes (Signed)
Patient ID: [name] [name], male   DOB: [birth year], [age] y.o.   MRN: [id number]           Reason for Appointment: Follow-up for Type 2 Diabetes   History of Present Illness:          Date of diagnosis of type 2 diabetes mellitus: 2011        Background history:  He was probably started on metformin at diagnosis and subsequently given Amaryl also Subsequently had been on [facility] and [facility]. Because of poor control he was given Levemir in addition to his Kombiglyze in 01/2013 The dose was progressively increased from initial dose of 12 units He did have somewhat better control right after starting insulin with A1c below 7% but only one time His blood sugars had been consistently high since 2014 with A1c at least 8%  Recent history:   INSULIN regimen is described as: TRESIBA 106 units at bedtime   Non-insulin hypoglycemic drugs the patient is taking are: Metformin ER 2 g, Victoza 1.8 mg daily   His A1c is significantly better at 6.6, previously 7.4  Current blood sugar patterns, management and problems identified:  He was able to increase Invokana to 300 mg as directed on the last visit  Although previously had dizziness with Invokana he is tolerating this well and not on antihypertensives.  However he is complaining of excessive urination with this  He has not done Many readings and has only 5 readings in the mornings which are variable and only one reading after lunch and supper.  Although he has lost weight he has not been exercising as consistently as before  He does try to watch his diet but not very consistently   Side effects from medications have been: ?  Dizziness from Invokana  Hypoglycemia: None    Glucose monitoring:  done less than once  in a day         Glucometer: One Touch.      Blood Glucose readings from download:   Mean values apply above for all meters except median for One Touch  PRE-MEAL Fasting Lunch Dinner  Bedtime Overall  Glucose range: 116-175  215  180    Mean/median:     152    Self-care: The diet that the patient has been following is: tries to limit carbs.     Meal times: Breakfast: 9 AM Lunch: 1 PM Dinner: 6 PM   Typical meal intake:Lunch is a sandwich or vegetables, variable meal at suppertime.  For snacks will have popcorn               Dietician visit, most recent: 6/16               Exercise:  walking upto 20-90 min 4-5/7 days      Weight history: Highest  previously 256  Wt Readings from Last 3 Encounters:  07/08/16 211 lb (95.7 kg)  05/08/16 214 lb 9.6 oz (97.3 kg)  03/04/16 217 lb (98.4 kg)    Glycemic control:   Lab Results  Component Value Date   HGBA1C 7.4 03/04/2016   HGBA1C 7.2 (H) 12/16/2015   HGBA1C 7.5 09/10/2015   Lab Results  Component Value Date   MICROALBUR 1.1 05/08/2016   LDLCALC 64 04/25/2014   CREATININE 0.68 05/08/2016         Medication List       Accurate as of 07/08/16  9:53 AM. Always use your most  recent med list.          aspirin 81 MG tablet Take 81 mg by mouth daily.   atorvastatin 20 MG tablet Commonly known as:  LIPITOR TAKE 1 TABLET (20 MG TOTAL) BY MOUTH DAILY.   canagliflozin 300 MG Tabs tablet Commonly known as:  INVOKANA Take 1 tablet (300 mg total) by mouth daily before breakfast.   glucose blood test strip One touch ultra blue test strip. Test blood sugar once daily. Dx code:E11.09.   Insulin Degludec 200 UNIT/ML Sopn Commonly known as:  TRESIBA FLEXTOUCH Inject 106 Units into the skin daily.   Insulin Pen Needle 32G X 6 MM Misc Commonly known as:  NOVOFINE USE ONCE DAILY WITH LEVIMIR INJECTION   metFORMIN 500 MG 24 hr tablet Commonly known as:  GLUCOPHAGE-XR Take 4 tablets (2,000 mg total) by mouth daily with supper.   ONETOUCH DELICA LANCETS 33G Misc Test blood sugar once daily. Dx code: E11.09   ramipril 2.5 MG capsule Commonly known as:  ALTACE Take 1 capsule (2.5 mg total) by mouth  daily.   VASCEPA 1 g Caps Generic drug:  Icosapent Ethyl Take 1 g by mouth daily.   VICTOZA 18 MG/3ML Sopn Generic drug:  liraglutide Inject 1.8 mg once daily at the same time       Allergies:  Allergies  Allergen Reactions  . Rosuvastatin Other (See Comments)    REACTION: myalgias    Past Medical History:  Diagnosis Date  . Aortic valve sclerosis    echo june 2009  . CAD (coronary artery disease)    a. 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 b. s/p CABG 04/2011.  . Carotid artery disease (HCC)    Doppler, preop, September, 2012,  mild bilateral plaque with no significant stenoses  . Diabetes mellitus   . Dizziness    June, 2013  . Ejection fraction    a. Ejection fraction 60%, echo, June, 2009. b. EF preserved by cath 2012.  [name] Food poisoning    age 46  . GERD (gastroesophageal reflux disease)   . Hx of CABG    [name] [name],  x4  May 01, 2011 limited to the LAD, left radial artery to the OM, SVG to diagonal, SVG to posterior descending  . Hyperlipidemia   . Hypertension   . Muscle ache    on Crestor  09/04/09 CPK 193(7-232) tolerated simvastatin in the past  . PVC (premature ventricular contraction) 06/2009   November, 2010  . Rash     Past Surgical History:  Procedure Laterality Date  . BACK SURGERY    . coccyx bone removed    . CORONARY ARTERY BYPASS GRAFT  05/01/2011   CABG x4 with LIMA to LAD, LRA to OM, SVG to D1, SVG to PDA, EVH via left thigh  . Intraoperative transesophageal echocardiography.  05/01/2011  . pilonidal cystectomy      Family History  Problem Relation Age of Onset  . Hypertension Father   . Heart attack Father 65    bypass  . Diabetes Father   . Hearing loss Father   . Thyroid disease Father   . Heart attack Paternal Grandfather     late 31s  . Hearing loss Paternal Grandfather   . Cancer Maternal Grandfather     site unknown  . Prostate cancer Maternal Grandfather   . Diabetes Paternal Aunt      X 3  . Heart failure Maternal Grandmother   .  Diabetes Mother   . Diabetes Paternal Uncle   . Stroke Neg Hx     Social History:  reports that he has never smoked. He does not have any smokeless tobacco history on file. He reports that he does not drink alcohol or use drugs.    Review of Systems   Lipid history: He has been On Vascepa for significantly high triglycerides along with his Lipitor  Triglycerides are 161  200    Lab Results  Component Value Date   CHOL 114 12/16/2015   HDL 27.60 (L) 12/16/2015   LDLCALC 64 04/25/2014   LDLDIRECT 61.0 12/16/2015   TRIG 278.0 (H) 12/16/2015   CHOLHDL 4 12/16/2015            THYROID: He has a 2.4 cm dominant left-sided thyroid nodule on Ultrasound in 12/2014  Follow-up showed the same size of the nodules  HYPERTENSION: Taking  2.5 mg ramipril Previously, he thinks he will take his blood pressure almost daily and it is higher he will take a ramipril which is about twice a week now Recent readings: 140/86  LABS:  No visits with results within 1 Week(s) from this visit.  Latest known visit with results is:  Office Visit on 05/08/2016  Component Date Value Ref Range Status  . Sodium 05/08/2016 140  135 - 145 mEq/L Final  . Potassium 05/08/2016 4.1  3.5 - 5.1 mEq/L Final  . Chloride 05/08/2016 105  96 - 112 mEq/L Final  . CO2 05/08/2016 27  19 - 32 mEq/L Final  . Glucose, Bld 05/08/2016 118* 70 - 99 mg/dL Final  . BUN 16/05/9603 14  6 - 23 mg/dL Final  . Creatinine, Ser 05/08/2016 0.68  0.40 - 1.50 mg/dL Final  . Total Bilirubin 05/08/2016 0.7  0.2 - 1.2 mg/dL Final  . Alkaline Phosphatase 05/08/2016 32* 39 - 117 U/L Final  . AST 05/08/2016 16  0 - 37 U/L Final  . ALT 05/08/2016 21  0 - 53 U/L Final  . Total Protein 05/08/2016 7.1  6.0 - 8.3 g/dL Final  . Albumin 54/04/8118 4.3  3.5 - 5.2 g/dL Final  . Calcium 14/78/2956 9.5  8.4 - 10.5 mg/dL Final  . GFR 21/30/8657 124.84  >60.00 mL/min Final  . Fructosamine 05/09/2016  278  0 - 285 umol/L Final   Comment: Published reference interval for apparently healthy subjects between age 67 and 68 is 43 - 285 umol/L and in a poorly controlled diabetic population is 228 - 563 umol/L with a mean of 396 umol/L.   [name] Microalb, Ur 05/08/2016 1.1  0.0 - 1.9 mg/dL Final  . Creatinine,U 84/69/6295 144.5  mg/dL Final  . Microalb Creat Ratio 05/08/2016 0.8  0.0 - 30.0 mg/g Final    Physical Examination:  BP 138/80   Pulse 75   Ht 5\' 8"  (1.727 m)   Wt 211 lb (95.7 kg)   SpO2 95%   BMI 32.08 kg/m      ASSESSMENT:  Diabetes type 2, uncontrolled with persistently high A1c    See history of present illness for detailed discussion of  current management, blood sugar patterns and problems identified  His blood sugars are  better with taking Invokana especially 300 mg dose He has continued to lose weight Although he has generally done well with his exercise regimen he is not always good with his diet and this may cause some fluctuation in his blood sugars especially postprandially and overnight Since he has complained of excessive  polyuria with Invokana 300 mg he may be able to reduce the dose  HYPERTENSION: Blood pressure is now controlled with only Invokana Continue to follow   PLAN:   He may try half tablet of 300 mg Invokana    More consistent monitoring at various times of the day, also needs to check more frequently especially to help adjust his mealtime dose.   not just in the morning  Continue exercise  May need to adjust Tresiba further if fasting readings are not consistently in the target range, currently since he has some normal readings in the morning and will not increase the dose  Continue Victoza 1.8 mg   Patient Instructions  Try 1/2 of 300mg  Invokana daily     [name] [name] 07/08/2016, 9:53 AM   Note: This office note was prepared with Insurance underwriterDragon voice recognition system technology. Any transcriptional errors that result from this process are  unintentional.

## 2016-08-03 ENCOUNTER — Other Ambulatory Visit: Payer: Self-pay | Admitting: Endocrinology

## 2016-08-10 ENCOUNTER — Other Ambulatory Visit: Payer: Self-pay | Admitting: Endocrinology

## 2016-08-10 NOTE — Telephone Encounter (Signed)
[id number] - [name] - [phone] (Phone) [phone] (Fax)  [facility] and the insurance needs a prior authorization.

## 2016-08-12 NOTE — Telephone Encounter (Signed)
Refill submitted on 08/10/2016.

## 2016-08-13 ENCOUNTER — Telehealth: Payer: Self-pay | Admitting: Endocrinology

## 2016-08-13 MED ORDER — GLUCOSE BLOOD VI STRP: ORAL_STRIP | 2 refills | Status: DC

## 2016-08-13 MED ORDER — MICROLET LANCETS MISC: 2 refills | Status: DC

## 2016-08-13 MED ORDER — BAYER CONTOUR NEXT EZ W/DEVICE KIT
PACK | 2 refills | Status: DC
Start: 2016-08-13 — End: 2017-09-10

## 2016-08-13 NOTE — Telephone Encounter (Signed)
Patient has questions about his ONETOUCH VERIO test strip. And other questions.

## 2016-08-13 NOTE — Telephone Encounter (Signed)
I contacted the patient. He advised me the meter he was using his insurance is no longer paying for. Patient advised we would send a new meter and supplies. Patient voiced understanding and will call back if he has any issues picking the meter up.

## 2016-08-25 ENCOUNTER — Telehealth: Payer: Self-pay | Admitting: Endocrinology

## 2016-08-25 ENCOUNTER — Other Ambulatory Visit: Payer: Self-pay

## 2016-08-25 MED ORDER — INSULIN DEGLUDEC 200 UNIT/ML ~~LOC~~ SOPN: 106.0000 [IU] | PEN_INJECTOR | Freq: Every day | SUBCUTANEOUS | 1 refills | Status: DC

## 2016-08-25 NOTE — Telephone Encounter (Signed)
Pt called in again and needs his Tresiba sent into the [facility] as soon as possible, he is leaving to go out of town.

## 2016-08-25 NOTE — Telephone Encounter (Signed)
Ordered 08/25/16 

## 2016-08-25 NOTE — Telephone Encounter (Signed)
Pt needs refills for tresiba called [facility]

## 2016-08-27 ENCOUNTER — Other Ambulatory Visit: Payer: Self-pay | Admitting: Endocrinology

## 2016-09-03 ENCOUNTER — Other Ambulatory Visit: Payer: Self-pay | Admitting: Endocrinology

## 2016-09-11 ENCOUNTER — Other Ambulatory Visit: Payer: Self-pay | Admitting: Endocrinology

## 2016-09-19 ENCOUNTER — Other Ambulatory Visit: Payer: Self-pay | Admitting: Endocrinology

## 2016-10-05 ENCOUNTER — Telehealth: Payer: Self-pay | Admitting: Endocrinology

## 2016-10-06 ENCOUNTER — Ambulatory Visit: Payer: BLUE CROSS/BLUE SHIELD | Admitting: Endocrinology

## 2016-10-26 NOTE — Progress Notes (Signed)
[name] [name] ID: [name] [name], male   DOB: 1950/08/09, 66 y.o.   MRN: [id number]           Reason for Appointment: Follow-up for Type 2 Diabetes   History of Present Illness:          Date of diagnosis of type 2 diabetes mellitus: 2011        Background history:  [name] [name] was probably started on metformin at diagnosis and subsequently given Amaryl also Subsequently had been on Janumet and more recently this was changed to [facility]. Because of poor control [name] [name] was given Levemir in addition to [name] [name] Kombiglyze in 01/2013 The dose was progressively increased from initial dose of 12 units [name] [name] did have somewhat better control right after starting insulin with A1c below 7% but only one time [name] [name] blood sugars had been consistently high since 2014 with A1c at least 8%  Recent history:   INSULIN regimen is described as: TRESIBA 106 units at bedtime   Non-insulin hypoglycemic drugs the [name] [name] is taking are: Metformin ER 2 g, Victoza 1.8 mg daily   [name] [name] A1c is 7%, previously down to 6.6  Current blood sugar patterns, management and problems identified:  [name] [name] was able to check a couple of readings after supper but only sporadically and these are mostly high  Also [name] [name] has had relatively high fasting readings and they may not correlate with [name] [name] nighttime readings  [name] [name] thinks [name] [name] can only take a half tablet of Invokana because of polyuria and cannot take it on Sundays when [name] [name] is busy  [name] [name] is not sure what factors medication sugars go higher in the evening compared to other days  However [name] [name] has not been active because of cold weather  [name] [name] weight is up slightly   Side effects from medications have been: ?  Dizziness from Invokana  Hypoglycemia: None    Glucose monitoring:  done less than once  in a day         Glucometer: One Touch.      Blood Glucose readings from download:   Mean values apply above for all meters except median for One Touch  PRE-MEAL Fasting Lunch Dinner Bedtime Overall  Glucose  range: 141-230       Mean/median: 172     17 2   POST-MEAL PC Breakfast PC Lunch PC Dinner  Glucose range:   170-227   Mean/median:        Self-care: The diet that the [name] [name] has been following is: tries to limit carbs.     Meal times: Breakfast: 9 AM Lunch: 1 PM Dinner: 6 PM   Typical meal intake:Lunch is a sandwich or vegetables, variable meal at suppertime.  For snacks will have popcorn               Dietician visit, most recent: 6/16               Exercise: not walking, was upto 20-90 min 4-5/7 days     Weight history: Highest  previously 256  Wt Readings from Last 3 Encounters:  10/27/16 215 lb (97.5 kg)  07/08/16 211 lb (95.7 kg)  05/08/16 214 lb 9.6 oz (97.3 kg)    Glycemic control:   Lab Results  Component Value Date   HGBA1C 6.6 07/08/2016   HGBA1C 7.4 03/04/2016   HGBA1C 7.2 (H) 12/16/2015   Lab Results  Component Value Date   MICROALBUR 1.1 05/08/2016   LDLCALC 64 04/25/2014   CREATININE 0.68 05/08/2016  Allergies as of 10/27/2016      Reactions   Rosuvastatin Other (See Comments)   REACTION: myalgias      Medication List       Accurate as of 10/27/16  8:30 AM. Always use your most recent med list.          aspirin 81 MG tablet Take 81 mg by mouth daily.   atorvastatin 20 MG tablet Commonly known as:  LIPITOR TAKE 1 TABLET (20 MG TOTAL) BY MOUTH DAILY.   canagliflozin 300 MG Tabs tablet Commonly known as:  INVOKANA Take 1 tablet (300 mg total) by mouth daily before breakfast.   CONTOUR NEXT EZ MONITOR w/Device Kit TEST BLOOD SUGAR ONCE DAILY   Insulin Degludec 200 UNIT/ML Sopn Commonly known as:  TRESIBA FLEXTOUCH Inject 106 Units into the skin daily.   Insulin Pen Needle 32G X 6 MM Misc Commonly known as:  NOVOFINE USE ONCE DAILY WITH LEVIMIR INJECTION   metFORMIN 500 MG 24 hr tablet Commonly known as:  GLUCOPHAGE-XR TAKE 4 TABLETS (2,000 MG TOTAL) BY MOUTH DAILY WITH SUPPER.   MICROLET LANCETS Misc TEST BLOOD SUGAR  ONCE DAILY   ONETOUCH VERIO test strip Generic drug:  glucose blood TEST BLOOD SUGAR ONCE DAILY   ONETOUCH VERIO test strip Generic drug:  glucose blood TEST BLOOD SUGAR ONCE DAILY   glucose blood test strip Commonly known as:  BAYER CONTOUR NEXT TEST TEST BLOOD SUGAR ONCE DAILY   VASCEPA 1 g Caps Generic drug:  Icosapent Ethyl Take 1 g by mouth daily.   VICTOZA 18 MG/3ML Sopn Generic drug:  liraglutide INJECT 1.8 MG ONCE DAILY AT THE SAME TIME   VICTOZA 18 MG/3ML Sopn Generic drug:  liraglutide INJECT 1.8 MG ONCE DAILY AT THE SAME TIME       Allergies:  Allergies  Allergen Reactions  . Rosuvastatin Other (See Comments)    REACTION: myalgias    Past Medical History:  Diagnosis Date  . Aortic valve sclerosis    echo june 2009  . CAD (coronary artery disease)    a. 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 b. s/p CABG 04/2011.  . Carotid artery disease ([place])    Doppler, preop, September, 2012,  mild bilateral plaque with no significant stenoses  . Diabetes mellitus   . Dizziness    June, 2013  . Ejection fraction    a. Ejection fraction 60%, echo, June, 2009. b. EF preserved by cath 2012.  [name] Food poisoning    age 64  . GERD (gastroesophageal reflux disease)   . Hx of CABG    Roxy Manns,  x4  May 01, 2011 limited to the LAD, left radial artery to the OM, SVG to diagonal, SVG to posterior descending  . Hyperlipidemia   . Hypertension   . Muscle ache    on Crestor  09/04/09 CPK 193(7-232) tolerated simvastatin in the past  . PVC (premature ventricular contraction) 06/2009   November, 2010  . Rash     Past Surgical History:  Procedure Laterality Date  . BACK SURGERY    . coccyx bone removed    . CORONARY ARTERY BYPASS GRAFT  05/01/2011   CABG x4 with LIMA to LAD, LRA to OM, SVG to D1, SVG to PDA, EVH via left thigh  . Intraoperative transesophageal echocardiography.  05/01/2011  . pilonidal cystectomy      Family  History  Problem Relation Age of Onset  . Heart attack Paternal Grandfather  late 28s  . Hearing loss Paternal Grandfather   . Hypertension Father   . Heart attack Father 25    bypass  . Diabetes Father   . Hearing loss Father   . Thyroid disease Father   . Cancer Maternal Grandfather     site unknown  . Prostate cancer Maternal Grandfather   . Diabetes Paternal Aunt     X 3  . Heart failure Maternal Grandmother   . Diabetes Mother   . Diabetes Paternal Uncle   . Stroke Neg Hx     Social History:  reports that [name] [name] has never smoked. [name] [name] has never used smokeless tobacco. [name] [name] reports that [name] [name] does not drink alcohol or use drugs.    Review of Systems   Lipid history: [name] [name] has been On Vascepa for significantly high triglycerides along with [name] [name] Lipitor  Triglycerides are 161 in 11/17    Lab Results  Component Value Date   CHOL 114 12/16/2015   HDL 27.60 (L) 12/16/2015   LDLCALC 64 04/25/2014   LDLDIRECT 61.0 12/16/2015   TRIG 278.0 (H) 12/16/2015   CHOLHDL 4 12/16/2015            THYROID: [name] [name] has a 2.4 cm dominant left-sided thyroid nodule on Ultrasound in 12/2014  Follow-up showed the same size of the nodules  HYPERTENSION:  Previously on  2.5 mg ramipril and this was stopped when [name] [name] was put on Invokana [name] [name] will take [name] [name] blood pressure almost daily and it is higher [name] [name] will take a ramipril which is about twice a week now Highest 140-150/86  LABS:  No visits with results within 1 Week(s) from this visit.  Latest known visit with results is:  Office Visit on 07/08/2016  Component Date Value Ref Range Status  . Hemoglobin A1C 07/08/2016 6.6   Final    Physical Examination:  BP 128/72   Pulse 62   Ht 5' 8"  (1.727 m)   Wt 215 lb (97.5 kg)   SpO2 96%   BMI 32.69 kg/m      ASSESSMENT:  Diabetes type 2, uncontrolled with persistently high A1c    See history of present illness for detailed discussion of  current management, blood sugar patterns and problems  identified  [name] [name] blood sugars are Overall getting higher again [name] [name] fasting readings are mostly high but has more consistently high readings after supper despite taking Victoza [name] [name] is not exercising much Diet is variable  HYPERTENSION: Blood pressure is now controlled with Invokana although [name] [name] states that [name] [name] takes ramipril couple of times a week when [name] [name] blood pressure is higher, not clear why [name] [name] blood pressure is inconsistent Continue to follow on the same regimen   LIPIDS to be rechecked today  PLAN:   [name] [name] can continue to try half tablet of 300 mg Invokana    Today discussed in detail the need for mealtime insulin to cover postprandial spikes, action of mealtime insulin, use of the insulin pen, timing and action of the rapid acting insulin as well as starting dose and dosage titration to target the two-hour reading of under 180.  Worksheet given for this also.  [name] [name] blood sugars need to be checked much more often after supper.  [name] [name] will use the Humalog with the co-pay card given, to start with 6 units the first day at suppertime only.  Since [name] [name] fasting reading may improve with evening meal coverage will not change [name] [name] Tyler Aas as yet  Since she has difficulty with the lancet device  for the contour [name] [name] will use the old lancet device for the One Touch, samples given  Continue Victoza 1.8 mg   [name] [name] Instructions  Humalog before supper, adjust based on meal size and carbs     Tymon Nemetz 10/27/2016, 8:30 AM   Note: This office note was prepared with Estate agent. Any transcriptional errors that result from this process are unintentional.

## 2016-10-27 ENCOUNTER — Encounter: Payer: Self-pay | Admitting: Endocrinology

## 2016-10-27 ENCOUNTER — Ambulatory Visit (INDEPENDENT_AMBULATORY_CARE_PROVIDER_SITE_OTHER): Payer: BLUE CROSS/BLUE SHIELD | Admitting: Endocrinology

## 2016-10-27 VITALS — BP 128/72 | HR 62 | Ht 68.0 in | Wt 215.0 lb

## 2016-10-27 DIAGNOSIS — E1165 Type 2 diabetes mellitus with hyperglycemia: Secondary | ICD-10-CM

## 2016-10-27 DIAGNOSIS — Z794 Long term (current) use of insulin: Secondary | ICD-10-CM | POA: Diagnosis not present

## 2016-10-27 LAB — BASIC METABOLIC PANEL
BUN: 12 mg/dL (ref 6–23)
CALCIUM: 9.2 mg/dL (ref 8.4–10.5)
CO2: 25 mEq/L (ref 19–32)
Chloride: 106 mEq/L (ref 96–112)
Creatinine, Ser: 0.64 mg/dL (ref 0.40–1.50)
GFR: 133.68 mL/min (ref >60.00)
Glucose, Bld: 177 mg/dL — ABNORMAL HIGH (ref 70–99)
Potassium: 4 mEq/L (ref 3.5–5.1)
SODIUM: 139 meq/L (ref 135–145)

## 2016-10-27 LAB — POCT GLYCOSYLATED HEMOGLOBIN (HGB A1C): HEMOGLOBIN A1C: 7

## 2016-10-27 LAB — LIPID PANEL
Cholesterol: 123 mg/dL (ref 0–200)
HDL: 24.1 mg/dL — AB (ref >39.00)
Total CHOL/HDL Ratio: 5

## 2016-10-27 LAB — LDL CHOLESTEROL, DIRECT: Direct LDL: 48 mg/dL

## 2016-10-27 MED ORDER — INSULIN LISPRO 100 UNIT/ML (KWIKPEN): PEN_INJECTOR | SUBCUTANEOUS | 1 refills | Status: DC

## 2016-10-27 NOTE — Patient Instructions (Signed)
Humalog before supper, adjust based on meal size and carbs

## 2016-10-28 ENCOUNTER — Other Ambulatory Visit: Payer: Self-pay | Admitting: Internal Medicine

## 2016-11-03 NOTE — Telephone Encounter (Signed)
Patient stated pharmacy said he needed an approval on  Insulin Pen Needle (NOVOFINE) 32G X 6 MM MISC 100 each   [id number] - [name], [place] - [address] AT [place] [address] [phone] (Phone) [phone] (Fax)

## 2016-11-03 NOTE — Telephone Encounter (Signed)
Spoke with pharmacy and patient does not have this on file

## 2016-11-04 ENCOUNTER — Other Ambulatory Visit: Payer: Self-pay

## 2016-11-04 MED ORDER — INSULIN PEN NEEDLE 32G X 6 MM MISC: 1 refills | Status: DC

## 2016-11-08 ENCOUNTER — Other Ambulatory Visit: Payer: Self-pay | Admitting: Endocrinology

## 2016-11-11 ENCOUNTER — Other Ambulatory Visit: Payer: Self-pay

## 2016-11-11 MED ORDER — INSULIN ASPART 100 UNIT/ML ~~LOC~~ SOLN: SUBCUTANEOUS | 3 refills | Status: DC

## 2016-11-15 NOTE — Progress Notes (Signed)
Please let patient know that the triglycerides are very high, need to change his Vascepa prescription to 4 capsules daily instead of one a day, also need to watch carbohydrates and fats more consistently

## 2016-11-17 ENCOUNTER — Other Ambulatory Visit: Payer: Self-pay

## 2016-11-17 MED ORDER — VASCEPA 1 G PO CAPS: ORAL_CAPSULE | ORAL | 3 refills | Status: DC

## 2016-11-25 ENCOUNTER — Other Ambulatory Visit: Payer: Self-pay

## 2016-11-25 ENCOUNTER — Other Ambulatory Visit: Payer: Self-pay | Admitting: Cardiology

## 2016-11-25 MED ORDER — CANAGLIFLOZIN 300 MG PO TABS: 300.0000 mg | ORAL_TABLET | Freq: Every day | ORAL | 3 refills | Status: DC

## 2016-11-25 MED ORDER — ATORVASTATIN CALCIUM 20 MG PO TABS: ORAL_TABLET | ORAL | 0 refills | Status: DC

## 2016-11-27 MED ORDER — ATORVASTATIN CALCIUM 20 MG PO TABS: ORAL_TABLET | ORAL | 0 refills | Status: DC

## 2016-11-27 NOTE — Addendum Note (Signed)
Addended by: Demetrios Loll on: 11/27/2016 09:52 AM   Modules accepted: Orders

## 2016-12-02 ENCOUNTER — Other Ambulatory Visit: Payer: Self-pay | Admitting: Endocrinology

## 2016-12-02 ENCOUNTER — Encounter: Payer: Self-pay | Admitting: Cardiology

## 2016-12-02 ENCOUNTER — Ambulatory Visit (INDEPENDENT_AMBULATORY_CARE_PROVIDER_SITE_OTHER): Payer: BLUE CROSS/BLUE SHIELD | Admitting: Cardiology

## 2016-12-02 VITALS — BP 134/86 | HR 77 | Ht 68.5 in | Wt 213.4 lb

## 2016-12-02 DIAGNOSIS — I2510 Atherosclerotic heart disease of native coronary artery without angina pectoris: Secondary | ICD-10-CM | POA: Diagnosis not present

## 2016-12-02 DIAGNOSIS — I739 Peripheral vascular disease, unspecified: Secondary | ICD-10-CM

## 2016-12-02 DIAGNOSIS — I779 Disorder of arteries and arterioles, unspecified: Secondary | ICD-10-CM

## 2016-12-02 DIAGNOSIS — E7800 Pure hypercholesterolemia, unspecified: Secondary | ICD-10-CM | POA: Diagnosis not present

## 2016-12-02 DIAGNOSIS — I2583 Coronary atherosclerosis due to lipid rich plaque: Secondary | ICD-10-CM

## 2016-12-02 DIAGNOSIS — E1165 Type 2 diabetes mellitus with hyperglycemia: Secondary | ICD-10-CM | POA: Diagnosis not present

## 2016-12-02 DIAGNOSIS — E1151 Type 2 diabetes mellitus with diabetic peripheral angiopathy without gangrene: Secondary | ICD-10-CM

## 2016-12-02 DIAGNOSIS — IMO0002 Reserved for concepts with insufficient information to code with codable children: Secondary | ICD-10-CM

## 2016-12-02 NOTE — Progress Notes (Signed)
Cardiology Office Note   Date:  12/02/2016   ID:  KAHLEEL FADELEY, DOB 1950/08/14, MRN [id number]  PCP:  [name] Reel, MD  Cardiologist:  [name] Furbish, MD  Endocrine: Dr. Dwyane [name]      History of Present Illness: [name] [name] is a 66 y.o. male former patient of Dr. Ron [name] who presents today to follow-up CAD/CABG artery disease and hyperlipidemia, former PCI to RCA  prior to his bypass surgery.     He tells me that he previously had nighttime leg cramps at first. This improved over time. He is stable now. His follow-up labs reveal an LDL of 56. TSH was normal. Unfortunately his triglycerides are high. I'm sure that goes along with his diabetes. He has been seeing Dr. Dwyane [name], endocrinology. Has Lovaza as well.  Triglycerides 544 on 04/15/15, hemoglobin A1c 7.5. Says he's taking himself off of Invokana. Was causing him to urinate at night and he was also concerned about the commercials on TV from the attorneys.  Never had symptoms prior to CABG. Dr. Linna [name] did ETT. Abnormal. First RCA stent in 2004 then CABG 2012.   I am seeing his father as well. His father has had carotid endarterectomy.  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 Takes trips to the [place] occasionally travel.  12/02/16-overall doing quite well, no chest pain, no shortness of breath, no syncope, no bleeding. Only problems, hard to work and take meds for DM. Dissatisfied personally with it. Feeling aches and pain. One time when pulling his fast boat in he feels like he strained his chest wall. He forgets that he has had bypass surgery. This dissipated after 2 or 3 days. He denies any syncope, chest pain, shortness of breath. Taking his medications well.  Past Medical History:  Diagnosis Date  . Aortic valve sclerosis    echo june 2009  . CAD (coronary artery disease)    a. 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 b. s/p CABG 04/2011.  . Carotid artery disease ([place])    Doppler, preop, September, 2012,  mild bilateral plaque with no significant stenoses  . Diabetes mellitus   . Dizziness    June, 2013  . Ejection fraction    a. Ejection fraction 60%, echo, June, 2009. b. EF preserved by cath 2012.  [name] [name] Food poisoning    age 16  . GERD (gastroesophageal reflux disease)   . Hx of CABG    [name] [name],  x4  May 01, 2011 limited to the LAD, left radial artery to the OM, SVG to diagonal, SVG to posterior descending  . Hyperlipidemia   . Hypertension   . Muscle ache    on Crestor  09/04/09 CPK 193(7-232) tolerated simvastatin in the past  . PVC (premature ventricular contraction) 06/2009   November, 2010  . Rash     Past Surgical History:  Procedure Laterality Date  . BACK SURGERY    . coccyx bone removed    . CORONARY ARTERY BYPASS GRAFT  05/01/2011   CABG x4 with LIMA to LAD, LRA to OM, SVG to D1, SVG to PDA, EVH via left thigh  . Intraoperative transesophageal echocardiography.  05/01/2011  . pilonidal cystectomy      Patient Active Problem List   Diagnosis Date Noted  . Dizziness   . Carotid artery disease ([place])   . Hx of CABG   . CAD (coronary artery disease)   . Hyperlipidemia   . Aortic valve sclerosis   .  GERD (gastroesophageal reflux disease)   . Muscle ache   . Ejection fraction   . KNEE PAIN, LEFT, CHRONIC 05/21/2010  . PVC (premature ventricular contraction) 06/03/2009  . Carotid bruit 06/03/2009  . Type II diabetes mellitus with peripheral circulatory disorder, uncontrolled ([name] [name]) 03/27/2008  . Essential hypertension 03/27/2008      Current Outpatient Prescriptions  Medication Sig Dispense Refill  . aspirin 81 MG tablet Take 81 mg by mouth daily.      [name] [name] atorvastatin (LIPITOR) 20 MG tablet TAKE 1 TABLET (20 MG TOTAL) BY MOUTH DAILY. 90 tablet 0  . Blood Glucose Monitoring Suppl (CONTOUR NEXT EZ MONITOR) w/Device KIT TEST BLOOD SUGAR ONCE DAILY 1 kit 2  . canagliflozin (INVOKANA) 300 MG TABS tablet Take 150 mg by mouth every other  day.    [name] [name] glucose blood (BAYER CONTOUR NEXT TEST) test strip TEST BLOOD SUGAR ONCE DAILY 100 each 2  . Icosapent Ethyl (VASCEPA) 1 g CAPS Take 2 capsules by mouth 2 (two) times daily.    . insulin aspart (NOVOLOG) 100 UNIT/ML injection Inject 6-12 Units into the skin daily before supper.    . Insulin Degludec (TRESIBA FLEXTOUCH) 200 UNIT/ML SOPN Inject 106 Units into the skin daily. 27 pen 1  . insulin lispro (HUMALOG) 100 UNIT/ML KiwkPen Inject 6-12 Units into the skin daily before supper.    . Insulin Pen Needle (NOVOFINE) 32G X 6 MM MISC Use to inject insulin 2 times per day. 100 each 1  . liraglutide (VICTOZA) 18 MG/3ML SOPN Inject 1.8 mg into the skin daily.    . metFORMIN (GLUCOPHAGE-XR) 500 MG 24 hr tablet TAKE 4 TABLETS (2,000 MG TOTAL) BY MOUTH DAILY WITH SUPPER. 360 tablet 0  . MICROLET LANCETS MISC TEST BLOOD SUGAR ONCE DAILY 100 each 2  . ONETOUCH VERIO test strip TEST BLOOD SUGAR ONCE DAILY 300 each 2  . ONETOUCH VERIO test strip TEST BLOOD SUGAR ONCE DAILY 300 each 1   No current facility-administered medications for this visit.     Allergies:   Rosuvastatin    Social History:  The patient  reports that he has never smoked. He has never used smokeless tobacco. He reports that he does not drink alcohol or use drugs.   Family History:  The patient's family history includes Cancer in his maternal grandfather; Diabetes in his father, mother, paternal aunt, and paternal uncle; Hearing loss in his father and paternal grandfather; Heart attack in his paternal grandfather; Heart attack (age of onset: 52) in his father; Heart failure in his maternal grandmother; Hypertension in his father; Prostate cancer in his maternal grandfather; Thyroid disease in his father.    ROS:  Please see the history of present illness.     Patient denies fever, chills, headache, sweats, rash, change in vision, change in hearing, chest pain, cough, nausea or vomiting, urinary symptoms. All other systems are  reviewed and are negative.   PHYSICAL EXAM: VS:  BP 134/86   Pulse 77   Ht 5' 8.5" (1.74 m)   Wt 213 lb 6.4 oz (96.8 kg)   BMI 31.98 kg/m  , GEN: Well nourished, well developed, in no acute distress  HEENT: normal  Neck: no JVD, carotid bruits, or masses Cardiac: RRR; no murmurs, rubs, or gallops,no edema  Respiratory:  clear to auscultation bilaterally, normal work of breathing GI: soft, nontender, nondistended, + BS MS: no deformity or atrophy  Skin: warm and dry, no rash Neuro:  Alert and Oriented x 3, Strength and  sensation are intact Psych: euthymic mood, full affect   EKG:   EKG ordered today 12/02/16-sinus rhythm 77 with nonspecific T-wave changes, subtle T-wave inversion in V6, QT interval 412 ms. Personally viewed. 06/12/15-sinus rhythm, 68, nonspecific ST-T wave changes, no other abnormalities. Poor R wave progression.  Echocardiogram: 01/24/2008  - Normal ejection fraction  - Mild aortic regurgitation  - Mild mitral regurgitation  Carotid ultrasound 04/28/2011  - Mild bilateral plaque  Recent Labs: 05/08/2016: ALT 21 10/27/2016: BUN 12; Creatinine, Ser 0.64; Potassium 4.0; Sodium 139    Lipid Panel    Component Value Date/Time   CHOL 123 10/27/2016 0844   TRIG (H) 10/27/2016 0844    505.0 Triglyceride is over 400; calculations on Lipids are invalid.   HDL 24.10 (L) 10/27/2016 0844   CHOLHDL 5 10/27/2016 0844   VLDL 55.6 (H) 12/16/2015 0845   LDLCALC 64 04/25/2014 0957   LDLDIRECT 48.0 10/27/2016 0844      Wt Readings from Last 3 Encounters:  12/02/16 213 lb 6.4 oz (96.8 kg)  10/27/16 215 lb (97.5 kg)  07/08/16 211 lb (95.7 kg)      Current medicines are reviewed  The patient understands his medications. Reviewed prior office notes, labs, ECG     ASSESSMENT AND PLAN:  Coronary artery disease/status post CABG  - No exertional symptoms, stable, no angina. Overall doing quite well.  Hyperlipidemia  - Dr. Ron [name] changed to a high potency statin, 20  mg of atorvastatin. Today he was worried that maybe some of the aches and pains that he is having, knees, top of his foot may be statin related. He also has low back pain at times when swinging his golf club. I would like for him to sit down with our lipid clinic to discuss. Potentially changing over to Crestor for instance may be helpful. His direct LDL is excellent.   - Dr. Dwyane [name] has given him Vascepa. Continue. His triglycerides were 505 on 10/27/16. Direct LDL was 48 however. Hemoglobin A1c 7.0. He understands.  Diabetes with peripheral vascular disease/carotid artery plaque  - Dr. Dwyane [name] has been working with him.  - Triglycerides have not improved as much as I would've expected with his decrease in hemoglobin A1c. Mild plaque. Continue with Vascepa.   Carotid artery plaque  - Mild plaque noted on ultrasound. Continue with aggressive secondary prevention. He was concerned because his father had a severe carotid artery stenosis that required carotid endarterectomy in his late 49s.  - No changes.   Follow up 1 year.  [name] Furbish, MD

## 2016-12-02 NOTE — Patient Instructions (Signed)
Medication Instructions:  Your physician recommends that you continue on your current medications as directed. Please refer to the Current Medication list given to you today.   Labwork: None  Testing/Procedures: None  Follow-Up: You have an appointment with the LIPID CLINIC Dec 22, 2016 at 8:30AM.  Your physician wants you to follow-up in: 1 year with Dr. Anne Fu. You will receive a reminder letter in the mail two months in advance. If you don't receive a letter, please call our office to schedule the follow-up appointment.   Any Other Special Instructions Will Be Listed Below (If Applicable).     If you need a refill on your cardiac medications before your next appointment, please call your pharmacy.

## 2016-12-03 ENCOUNTER — Other Ambulatory Visit: Payer: Self-pay | Admitting: Endocrinology

## 2016-12-22 ENCOUNTER — Ambulatory Visit: Payer: BLUE CROSS/BLUE SHIELD

## 2016-12-22 ENCOUNTER — Encounter: Payer: Self-pay | Admitting: Pharmacist

## 2016-12-22 ENCOUNTER — Ambulatory Visit (INDEPENDENT_AMBULATORY_CARE_PROVIDER_SITE_OTHER): Payer: BLUE CROSS/BLUE SHIELD | Admitting: Pharmacist

## 2016-12-22 DIAGNOSIS — E7800 Pure hypercholesterolemia, unspecified: Secondary | ICD-10-CM

## 2016-12-22 MED ORDER — ICOSAPENT ETHYL 1 G PO CAPS
2.0000 | ORAL_CAPSULE | Freq: Two times a day (BID) | ORAL | 1 refills | Status: DC
Start: 2016-12-22 — End: 2017-11-22

## 2016-12-22 NOTE — Progress Notes (Signed)
Patient ID: [name] [name]                 DOB: 11/05/1950                    MRN: [id number]     HPI: [name] [name] is a 66 y.o. male patient of Dr. Marlou Porch that presents today for lipid evaluation.  PMH includes CAD/CABG artery disease and hyperlipidemia, former PCI to RCA  prior to his bypass surgery.  He recently reported that he was having some muscle discomfort on atorvastatin when he saw Dr. Marlou Porch. His most recent lipid panel also revealed markedly elevated TG. He states he was fasting for this panel.   He presents today and states he can tolerate muscle aches on atorvastatin. He states its currently just occasional cramping and he can tell he is on a statin, but that it is tolerable. He reports that he was on rosuvastatin in the past and this caused his skin to crawl.   He also reports that he thought he was to take Vascepa 2caps twice daily, but bottle said 2 caps once daily and he has been taking it only once daily due to this.   He endorses that his PCP, Dr. Dwyane Dee has made several changes recently with is diabetic medications to help with his blood sugars.   Risk Factors: CAD s/p PCI/ CABG  LDL Goal: <70, TG<150, nonHDL <100  Current Medications: Vascepa 2g BID - has only been doing 2 caps once a day, atorvastatin 56m daily  Intolerances: atorvastatin - cramps and occasionally, Crestor (rousuvastatin) - muscle aches and skin crawling.   Diet: No meals prepared at home. Eats a lot of rice, mostly fried. Eats smaller portions. He varies his diet immensely.   Exercise: Exercises his german shepherd daily. He walks a lot and is very active. He golfs and is active helping doing work for [employer] other organizations.   Family History: Cancer in his maternal grandfather; Diabetes in his father, mother, paternal aunt, and paternal uncle; Hearing loss in his father and paternal grandfather; Heart attack in his paternal grandfather; Heart attack (age of onset: 659 in his father; Heart  failure in his maternal grandmother; Hypertension in his father; Prostate cancer in his maternal grandfather; Thyroid disease in his father.   Social History: The patient  reports that he has never smoked. He has never used smokeless tobacco. He reports that he does not drink alcohol or use drugs.  Labs: 10/27/16:  TC 123, TG 505, HDL 24, LDL-D 48, non-HDL 99 - atorvastatin 255mdaily, Vascepa 2g once daily  Past Medical History:  Diagnosis Date  . Aortic valve sclerosis    echo june 2009  . CAD (coronary artery disease)    a. 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 b. s/p CABG 04/2011.  . Carotid artery disease ([facility]   Doppler, preop, September, 2012,  mild bilateral plaque with no significant stenoses  . Diabetes mellitus   . Dizziness    June, 2013  . Ejection fraction    a. Ejection fraction 60%, echo, June, 2009. b. EF preserved by cath 2012.  . Marland Kitchenood poisoning    age 66. GERD (gastroesophageal reflux disease)   . Hx of CABG    OwRoxy Manns x4  May 01, 2011 limited to the LAD, left radial artery to the OM, SVG to diagonal, SVG to posterior descending  . Hyperlipidemia   .  Hypertension   . Muscle ache    on Crestor  09/04/09 CPK 193(7-232) tolerated simvastatin in the past  . PVC (premature ventricular contraction) 06/2009   November, 2010  . Rash     Current Outpatient Prescriptions on File Prior to Visit  Medication Sig Dispense Refill  . aspirin 81 MG tablet Take 81 mg by mouth daily.      [name] atorvastatin (LIPITOR) 20 MG tablet TAKE 1 TABLET (20 MG TOTAL) BY MOUTH DAILY. 90 tablet 0  . Blood Glucose Monitoring Suppl (CONTOUR NEXT EZ MONITOR) w/Device KIT TEST BLOOD SUGAR ONCE DAILY 1 kit 2  . canagliflozin (INVOKANA) 300 MG TABS tablet Take 150 mg by mouth every other day.    [name] glucose blood (BAYER CONTOUR NEXT TEST) test strip TEST BLOOD SUGAR ONCE DAILY 100 each 2  . Icosapent Ethyl (VASCEPA) 1 g CAPS Take 2 capsules by mouth 2  (two) times daily.    . insulin aspart (NOVOLOG) 100 UNIT/ML injection Inject 6-12 Units into the skin daily before supper.    . insulin lispro (HUMALOG) 100 UNIT/ML KiwkPen Inject 6-12 Units into the skin daily before supper.    . Insulin Pen Needle (NOVOFINE) 32G X 6 MM MISC Use to inject insulin 2 times per day. 100 each 1  . liraglutide (VICTOZA) 18 MG/3ML SOPN Inject 1.8 mg into the skin daily.    . metFORMIN (GLUCOPHAGE-XR) 500 MG 24 hr tablet TAKE 4 TABLETS (2,000 MG TOTAL) BY MOUTH DAILY WITH SUPPER. 360 tablet 0  . MICROLET LANCETS MISC TEST BLOOD SUGAR ONCE DAILY 100 each 2  . ONETOUCH VERIO test strip TEST BLOOD SUGAR ONCE DAILY 300 each 2  . ONETOUCH VERIO test strip TEST BLOOD SUGAR ONCE DAILY 300 each 1  . TRESIBA FLEXTOUCH 200 UNIT/ML SOPN INJECT 106 UNITS INTO THE SKIN DAILY. 27 pen 1   No current facility-administered medications on file prior to visit.     Allergies  Allergen Reactions  . Rosuvastatin Other (See Comments)    REACTION: myalgias    Assessment/Plan: Hyperlipidemia: LDL at goal, TG elevated above goal <150. Will increase Vascepa back to 2g BID. If this does not get to goal could consider addition of fibrate, though likely will put him at increase risk of muscle symptoms in combination with statin. He will remain on atorvastatin for now and call with any change in symptoms as he prefers this over trying a different agent. He will continue to work on diet and exercise to help with cholesterol as well. He has follow up labs scheduled with PCP in June - will follow up after this if no improvement in TG.   Thank you,  Lelan Pons. Patterson Hammersmith, [facility]  12/22/2016 11:05 AM

## 2016-12-22 NOTE — Progress Notes (Deleted)
Patient ID: [name] [name]                 DOB: April 15, 1951                    MRN: [id number]     HPI: [name] [name] is a 66 y.o. male patient of Dr. Marlou Porch that presents today for lipid evaluation.  PMH includes CAD/CABG artery disease and hyperlipidemia, former PCI to RCA  prior to his bypass surgery.    Risk Factors: CAD s/p PCI/ CABG  LDL Goal: <70, TG<150, nonHDL <100  Current Medications: Vascepa 2g BID, atorvastatin 55m dialy  Intolerances:   Diet:   Exercise:   Family History: Cancer in his maternal grandfather; Diabetes in his father, mother, paternal aunt, and paternal uncle; Hearing loss in his father and paternal grandfather; Heart attack in his paternal grandfather; Heart attack (age of onset: 613 in his father; Heart failure in his maternal grandmother; Hypertension in his father; Prostate cancer in his maternal grandfather; Thyroid disease in his father.   Social History: The patient  reports that he has never smoked. He has never used smokeless tobacco. He reports that he does not drink alcohol or use drugs.  Labs: 10/27/16:  TC 123, TG 505, HDL 24, LDL-D 48, non-HDL 99 - atorvastatin 241mdaily, Vascepa 2g BID  Past Medical History:  Diagnosis Date  . Aortic valve sclerosis    echo june 2009  . CAD (coronary artery disease)    a. 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 b. s/p CABG 04/2011.  . Carotid artery disease ([facility]   Doppler, preop, September, 2012,  mild bilateral plaque with no significant stenoses  . Diabetes mellitus   . Dizziness    June, 2013  . Ejection fraction    a. Ejection fraction 60%, echo, June, 2009. b. EF preserved by cath 2012.  . Marland Kitchenood poisoning    age 89. GERD (gastroesophageal reflux disease)   . Hx of CABG    OwRoxy Manns x4  May 01, 2011 limited to the LAD, left radial artery to the OM, SVG to diagonal, SVG to posterior descending  . Hyperlipidemia   . Hypertension   . Muscle ache    on Crestor  09/04/09 CPK 193(7-232) tolerated simvastatin in the past  . PVC (premature ventricular contraction) 06/2009   November, 2010  . Rash     Current Outpatient Prescriptions on File Prior to Visit  Medication Sig Dispense Refill  . aspirin 81 MG tablet Take 81 mg by mouth daily.      . Marland Kitchentorvastatin (LIPITOR) 20 MG tablet TAKE 1 TABLET (20 MG TOTAL) BY MOUTH DAILY. 90 tablet 0  . Blood Glucose Monitoring Suppl (CONTOUR NEXT EZ MONITOR) w/Device KIT TEST BLOOD SUGAR ONCE DAILY 1 kit 2  . canagliflozin (INVOKANA) 300 MG TABS tablet Take 150 mg by mouth every other day.    . Marland Kitchenlucose blood (BAYER CONTOUR NEXT TEST) test strip TEST BLOOD SUGAR ONCE DAILY 100 each 2  . Icosapent Ethyl (VASCEPA) 1 g CAPS Take 2 capsules by mouth 2 (two) times daily.    . insulin aspart (NOVOLOG) 100 UNIT/ML injection Inject 6-12 Units into the skin daily before supper.    . insulin lispro (HUMALOG) 100 UNIT/ML KiwkPen Inject 6-12 Units into the skin daily before supper.    . Insulin Pen Needle (NOVOFINE) 32G X 6 MM MISC Use to inject insulin 2  times per day. 100 each 1  . liraglutide (VICTOZA) 18 MG/3ML SOPN Inject 1.8 mg into the skin daily.    . metFORMIN (GLUCOPHAGE-XR) 500 MG 24 hr tablet TAKE 4 TABLETS (2,000 MG TOTAL) BY MOUTH DAILY WITH SUPPER. 360 tablet 0  . MICROLET LANCETS MISC TEST BLOOD SUGAR ONCE DAILY 100 each 2  . ONETOUCH VERIO test strip TEST BLOOD SUGAR ONCE DAILY 300 each 2  . ONETOUCH VERIO test strip TEST BLOOD SUGAR ONCE DAILY 300 each 1  . TRESIBA FLEXTOUCH 200 UNIT/ML SOPN INJECT 106 UNITS INTO THE SKIN DAILY. 27 pen 1   No current facility-administered medications on file prior to visit.     Allergies  Allergen Reactions  . Rosuvastatin Other (See Comments)    REACTION: myalgias    Assessment/Plan: Hyperlipidemia: LDL at goal, TG elevated.   Thank you,  Lelan Pons. Patterson Hammersmith, [facility]  12/22/2016 7:52 AM

## 2016-12-22 NOTE — Patient Instructions (Addendum)
INCREASE back to Vascepa to 2 capsules twice daily   CONTINUE all other medications as prescribed.   If you do not have your cholesterol checked at your primary care doctor we will check here in 2-3 months  Please call [phone] with any questions or concerns.    Cholesterol Cholesterol is a fat. Your body needs a small amount of cholesterol. Cholesterol (plaque) may build up in your blood vessels (arteries). That makes you more likely to have a heart attack or stroke. You cannot feel your cholesterol level. Having a blood test is the only way to find out if your level is high. Keep your test results. Work with your doctor to keep your cholesterol at a good level. What do the results mean?  Total cholesterol is how much cholesterol is in your blood.  LDL is bad cholesterol. This is the type that can build up. Try to have low LDL.  HDL is good cholesterol. It cleans your blood vessels and carries LDL away. Try to have high HDL.  Triglycerides are fat that the body can store or burn for energy. What are good levels of cholesterol?  Total cholesterol below 200.  LDL below 100 is good for people who have health risks. LDL below 70 is good for people who have very high risks.  HDL above 40 is good. It is best to have HDL of 60 or higher.  Triglycerides below 150. How can I lower my cholesterol? Diet  Follow your diet program as told by your doctor.  Choose fish, white meat chicken, or [place] that is roasted or baked. Try not to eat red meat, fried foods, sausage, or lunch meats.  Eat lots of fresh fruits and vegetables.  Choose whole grains, beans, pasta, potatoes, and cereals.  Choose olive oil, corn oil, or canola oil. Only use small amounts.  Try not to eat butter, mayonnaise, shortening, or palm kernel oils.  Try not to eat foods with trans fats.  Choose low-fat or nonfat dairy foods.  Drink skim or nonfat milk.  Eat low-fat or nonfat yogurt and cheeses.  Try not  to drink whole milk or cream.  Try not to eat ice cream, egg yolks, or full-fat cheeses.  Healthy desserts include angel food cake, ginger snaps, animal crackers, hard candy, popsicles, and low-fat or nonfat frozen yogurt. Try not to eat pastries, cakes, pies, and cookies. Exercise  Follow your exercise program as told by your doctor.  Be more active. Try gardening, walking, and taking the stairs.  Ask your doctor about ways that you can be more active. Medicine  Take over-the-counter and prescription medicines only as told by your doctor. This information is not intended to replace advice given to you by your health care provider. Make sure you discuss any questions you have with your health care provider. Document Released: 10/16/2008 Document Revised: 02/19/2016 Document Reviewed: 01/30/2016 Elsevier Interactive Patient Education  2017 [employer]

## 2017-01-26 ENCOUNTER — Other Ambulatory Visit: Payer: Self-pay

## 2017-01-27 ENCOUNTER — Encounter: Payer: Self-pay | Admitting: Endocrinology

## 2017-01-27 ENCOUNTER — Ambulatory Visit (INDEPENDENT_AMBULATORY_CARE_PROVIDER_SITE_OTHER): Payer: BLUE CROSS/BLUE SHIELD | Admitting: Endocrinology

## 2017-01-27 VITALS — BP 138/86 | HR 81 | Ht 68.0 in | Wt 214.8 lb

## 2017-01-27 DIAGNOSIS — E782 Mixed hyperlipidemia: Secondary | ICD-10-CM | POA: Diagnosis not present

## 2017-01-27 DIAGNOSIS — I10 Essential (primary) hypertension: Secondary | ICD-10-CM | POA: Diagnosis not present

## 2017-01-27 DIAGNOSIS — E1165 Type 2 diabetes mellitus with hyperglycemia: Secondary | ICD-10-CM

## 2017-01-27 DIAGNOSIS — Z794 Long term (current) use of insulin: Secondary | ICD-10-CM

## 2017-01-27 LAB — LIPID PANEL
Cholesterol: 114 mg/dL (ref 0–200)
HDL: 29.8 mg/dL — ABNORMAL LOW (ref >39.00)
NonHDL: 84.06
Total CHOL/HDL Ratio: 4
Triglycerides: 225 mg/dL — ABNORMAL HIGH (ref 0.0–149.0)
VLDL: 45 mg/dL — ABNORMAL HIGH (ref 0.0–40.0)

## 2017-01-27 LAB — MICROALBUMIN / CREATININE URINE RATIO
CREATININE, U: 59.4 mg/dL
MICROALB UR: 0.4 mg/dL (ref 0.0–1.9)
Microalb Creat Ratio: 0.7 mg/g (ref 0.0–30.0)

## 2017-01-27 LAB — BASIC METABOLIC PANEL
BUN: 12 mg/dL (ref 6–23)
CALCIUM: 9.5 mg/dL (ref 8.4–10.5)
CO2: 27 mEq/L (ref 19–32)
Chloride: 105 mEq/L (ref 96–112)
Creatinine, Ser: 0.62 mg/dL (ref 0.40–1.50)
GFR: 138.56 mL/min (ref >60.00)
GLUCOSE: 157 mg/dL — AB (ref 70–99)
Potassium: 3.9 mEq/L (ref 3.5–5.1)
Sodium: 138 mEq/L (ref 135–145)

## 2017-01-27 LAB — LDL CHOLESTEROL, DIRECT: LDL DIRECT: 53 mg/dL

## 2017-01-27 NOTE — Patient Instructions (Addendum)
Check blood sugars on waking up 3/7 days  Also check blood sugars about 2 hours after a meal and do this after different meals by rotation  Recommended blood sugar levels on waking up is 90-130 and about 2 hours after meal is 130-160  Please bring your blood sugar monitor to each visit, thank you  Must take Humalog BEFORE MEAL with any carbs or high fat   Stop Ramipril and take full Invokana  Walk daily  Tresiba 110 units  Check coverage for Ozempic injection

## 2017-01-27 NOTE — Addendum Note (Signed)
Addended by: Adline MangoSTONE-ELMORE, Denni France I on: 01/27/2017 01:58 PM   Modules accepted: Orders

## 2017-01-27 NOTE — Progress Notes (Signed)
Patient ID: [name] [name], male   DOB: 1951-01-07, 66 y.o.   MRN: [id number]           Reason for Appointment: Follow-up for Type 2 Diabetes   History of Present Illness:          Date of diagnosis of type 2 diabetes mellitus: 2011        Background history:  He was probably started on metformin at diagnosis and subsequently given Amaryl also Subsequently had been on Janumet and more recently this was changed to [facility]. Because of poor control he was given Levemir in addition to his Kombiglyze in 01/2013 The dose was progressively increased from initial dose of 12 units He did have somewhat better control right after starting insulin with A1c below 7% but only one time His blood sugars had been consistently high since 2014 with A1c at least 8%  Recent history:   INSULIN regimen is described as: TRESIBA 106 units at bedtime Humalog at suppertime irregularly    Non-insulin hypoglycemic drugs the patient is taking are: Metformin ER 2 g, Victoza 1.8 mg daily   His A1c is 7.2 %, this is done by PCP last month, previously down to 6.6  Current blood sugar patterns, management and problems identified:  He was started on Humalog at suppertime on the last visit but he has taken this very erratically  He says that he does not eat a proper meal sometimes in the evenings and will not take insulin  Also when eating out he will take insulin when he comes back home  He has not checked any readings AFTER his evening meal in the last month and not clear if his blood sugars are controlled  FASTING readings are about the same overall as before and fluctuating  His diet is quite variable in carbohydrate content and sometimes will have fried food.  Also he thinks he may be snacking more at times in the evenings foods like popcorn   He has difficulty losing weight  Side effects from medications have been: ?  Dizziness from Invokana  Hypoglycemia: None    Glucose monitoring:  done less  than once  in a day         Glucometer:  contour Blood Glucose readings from download:   Mean values apply above for all meters except median for One Touch  PRE-MEAL Fasting Lunch Dinner Bedtime Overall  Glucose range: 133-199      Mean/median:  171          Self-care: The diet that the patient has been following is: tries to limit carbs.     Meal times: Breakfast: 9 AM Lunch: 1 PM Dinner: 6 PM   Typical meal intake:Lunch is a sandwich or vegetables, variable meal at suppertime.  For snacks will have popcorn               Dietician visit, most recent: 6/16               Exercise: not walking, was doing this 4-5/7 days     Weight history: Highest  previously 256  Wt Readings from Last 3 Encounters:  01/27/17 214 lb 12.8 oz (97.4 kg)  12/02/16 213 lb 6.4 oz (96.8 kg)  10/27/16 215 lb (97.5 kg)    Glycemic control:   Lab Results  Component Value Date   HGBA1C 7.0 10/27/2016   HGBA1C 6.6 07/08/2016   HGBA1C 7.4 03/04/2016   Lab Results  Component Value Date  MICROALBUR 1.1 05/08/2016   LDLCALC 64 04/25/2014   CREATININE 0.64 10/27/2016       Allergies as of 01/27/2017      Reactions   Rosuvastatin Other (See Comments)   REACTION: myalgias      Medication List       Accurate as of 01/27/17  1:38 PM. Always use your most recent med list.          aspirin 81 MG tablet Take 81 mg by mouth daily.   atorvastatin 20 MG tablet Commonly known as:  LIPITOR TAKE 1 TABLET (20 MG TOTAL) BY MOUTH DAILY.   CONTOUR NEXT EZ MONITOR w/Device Kit TEST BLOOD SUGAR ONCE DAILY   glucose blood test strip Commonly known as:  BAYER CONTOUR NEXT TEST TEST BLOOD SUGAR ONCE DAILY   Icosapent Ethyl 1 g Caps Commonly known as:  VASCEPA Take 2 capsules by mouth 2 (two) times daily.   insulin lispro 100 UNIT/ML KiwkPen Commonly known as:  HUMALOG Inject 6-12 Units into the skin daily before supper.   Insulin Pen Needle 32G X 6 MM Misc Commonly known as:  NOVOFINE Use to  inject insulin 2 times per day.   INVOKANA 300 MG Tabs tablet Generic drug:  canagliflozin Take 150 mg by mouth every other day.   metFORMIN 500 MG 24 hr tablet Commonly known as:  GLUCOPHAGE-XR TAKE 4 TABLETS (2,000 MG TOTAL) BY MOUTH DAILY WITH SUPPER.   MICROLET LANCETS Misc TEST BLOOD SUGAR ONCE DAILY   TRESIBA FLEXTOUCH 200 UNIT/ML Sopn Generic drug:  Insulin Degludec INJECT 106 UNITS INTO THE SKIN DAILY.   VICTOZA 18 MG/3ML Sopn Generic drug:  liraglutide Inject 1.8 mg into the skin daily.       Allergies:  Allergies  Allergen Reactions  . Rosuvastatin Other (See Comments)    REACTION: myalgias    Past Medical History:  Diagnosis Date  . Aortic valve sclerosis    echo june 2009  . CAD (coronary artery disease)    a. 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 b. s/p CABG 04/2011.  . Carotid artery disease ([place])    Doppler, preop, September, 2012,  mild bilateral plaque with no significant stenoses  . Diabetes mellitus   . Dizziness    June, 2013  . Ejection fraction    a. Ejection fraction 60%, echo, June, 2009. b. EF preserved by cath 2012.  [name] Food poisoning    age 69  . GERD (gastroesophageal reflux disease)   . Hx of CABG    Roxy Manns,  x4  May 01, 2011 limited to the LAD, left radial artery to the OM, SVG to diagonal, SVG to posterior descending  . Hyperlipidemia   . Hypertension   . Muscle ache    on Crestor  09/04/09 CPK 193(7-232) tolerated simvastatin in the past  . PVC (premature ventricular contraction) 06/2009   November, 2010  . Rash     Past Surgical History:  Procedure Laterality Date  . BACK SURGERY    . coccyx bone removed    . CORONARY ARTERY BYPASS GRAFT  05/01/2011   CABG x4 with LIMA to LAD, LRA to OM, SVG to D1, SVG to PDA, EVH via left thigh  . Intraoperative transesophageal echocardiography.  05/01/2011  . pilonidal cystectomy      Family History  Problem Relation Age of Onset  . Heart  attack Paternal Grandfather        late 29s  . Hearing  loss Paternal Grandfather   . Hypertension Father   . Heart attack Father 65       bypass  . Diabetes Father   . Hearing loss Father   . Thyroid disease Father   . Cancer Maternal Grandfather        site unknown  . Prostate cancer Maternal Grandfather   . Diabetes Paternal Aunt        X 3  . Heart failure Maternal Grandmother   . Diabetes Mother   . Diabetes Paternal Uncle   . Stroke Neg Hx     Social History:  reports that he has never smoked. He has never used smokeless tobacco. He reports that he does not drink alcohol or use drugs.    Review of Systems   Lipid history: He has been On Vascepa for significantly high triglycerides along with his Lipitor  Triglycerides have been significantly high Today labs have been drawn    Lab Results  Component Value Date   CHOL 123 10/27/2016   HDL 24.10 (L) 10/27/2016   LDLCALC 64 04/25/2014   LDLDIRECT 48.0 10/27/2016   TRIG (H) 10/27/2016    505.0 Triglyceride is over 400; calculations on Lipids are invalid.   CHOLHDL 5 10/27/2016            THYROID: He has a 2.4 cm dominant left-sided thyroid nodule on Ultrasound in 12/2014  Follow-up showed the same size of the nodules  HYPERTENSION:  Previously on  2.5 mg ramipril and this was stopped when he was put on Invokana  However he thinks his blood pressure has been higher lately and he has started taking ramipril again However he does not think his blood pressure is consistently high  Highest blood pressure at home 140-150/86  Headaches: These his last 2 weeks she has had headaches which are mostly in the back of the neck and head and relieved by 800 mg Motrin  LABS:  No visits with results within 1 Week(s) from this visit.  Latest known visit with results is:  Office Visit on 10/27/2016  Component Date Value Ref Range Status  . Sodium 10/27/2016 139  135 - 145 mEq/L Final  . Potassium 10/27/2016 4.0  3.5 - 5.1  mEq/L Final  . Chloride 10/27/2016 106  96 - 112 mEq/L Final  . CO2 10/27/2016 25  19 - 32 mEq/L Final  . Glucose, Bld 10/27/2016 177* 70 - 99 mg/dL Final  . BUN 10/27/2016 12  6 - 23 mg/dL Final  . Creatinine, Ser 10/27/2016 0.64  0.40 - 1.50 mg/dL Final  . Calcium 10/27/2016 9.2  8.4 - 10.5 mg/dL Final  . GFR 10/27/2016 133.68  >60.00 mL/min Final  . Cholesterol 10/27/2016 123  0 - 200 mg/dL Final   ATP III Classification       Desirable:  < 200 mg/dL               Borderline High:  200 - 239 mg/dL          High:  > = 240 mg/dL  . Triglycerides 10/27/2016 505.0 Triglyceride is over 400; calculations on Lipids are invalid.* 0.0 - 149.0 mg/dL Final   Normal:  <150 mg/dLBorderline High:  150 - 199 mg/dL  . HDL 10/27/2016 24.10* >39.00 mg/dL Final  . Total CHOL/HDL Ratio 10/27/2016 5   Final                  Men  Women1/2 Average Risk     3.4          3.3Average Risk          5.0          4.42X Average Risk          9.6          7.13X Average Risk          15.0          11.0                      . Hemoglobin A1C 10/27/2016 7.0   Final  . Direct LDL 10/27/2016 48.0  mg/dL Final   Optimal:  <100 mg/dLNear or Above Optimal:  100-129 mg/dLBorderline High:  130-159 mg/dLHigh:  160-189 mg/dLVery High:  >190 mg/dL    Physical Examination:  BP 138/86   Pulse 81   Ht 5' 8"  (1.727 m)   Wt 214 lb 12.8 oz (97.4 kg)   SpO2 97%   BMI 32.66 kg/m      ASSESSMENT:  Diabetes type 2, uncontrolled with persistently high A1c    See history of present illness for detailed discussion of  current management, blood sugar patterns and problems identified  His blood sugars are Not well controlled with A1c gradually going up His fasting readings are variably high  Has not done any readings after evening meals even though he is starting to take some a lot of as directed Not clear how much Humalog he needs because he does not do postprandial reading Also his diet is variable in the evenings and  probably getting too many snacks and carbohydrates at times He is not exercising recently and weight is unchanged  HYPERTENSION: Blood pressure is looking higher even though he has taken 2.5 mg ramipril  Headaches: May be from cervical spondylosis and he will talk to his PCP about this    LIPIDS to be rechecked today  PLAN:   He can not take the full tablets of 300 mg Invokana  Stop ramipril for now  Increase Tresiba up to 110 units    He needs to check his readings after meals at least every other day  Discussed at he will need to check his readings around 8-9 PM to be able to adjust his Humalog at suppertime  He can still take between 6-10 units based on meal size and carbohydrate intake but needs to keep in mind that sugars after meals should not be over 180 any time   Start regular walking  Continue Victoza 1.8 mg but he can check coverage for Ozempic instead   Patient Instructions  Check blood sugars on waking up 3/7 days  Also check blood sugars about 2 hours after a meal and do this after different meals by rotation  Recommended blood sugar levels on waking up is 90-130 and about 2 hours after meal is 130-160  Please bring your blood sugar monitor to each visit, thank you  Must take Humalog BEFORE MEAL with any carbs or high fat   Stop Ramipril and take full Invokana  Walk daily  Tresiba 110 units  Check coverage for Ozempic injection   Counseling time on subjects discussed above is over 50% of today's 25 minute visit   Tiaunna Buford 01/27/2017, 1:38 PM   Note: This office note was prepared with Dragon voice recognition system technology. Any transcriptional errors that result from this process are unintentional.

## 2017-01-28 IMAGING — US US SOFT TISSUE HEAD/NECK
1 series · 14 of 25 positions shown · non-contrast
Comparison: None.

CLINICAL DATA: Palpable left lobe

EXAM:
THYROID ULTRASOUND
TECHNIQUE: Ultrasound examination of the thyroid gland and adjacent soft
tissues was performed.

[Series 1: us soft tissue head/neck · 0.07mm/px · 14 of 54 slices shown]
[im 1/54]
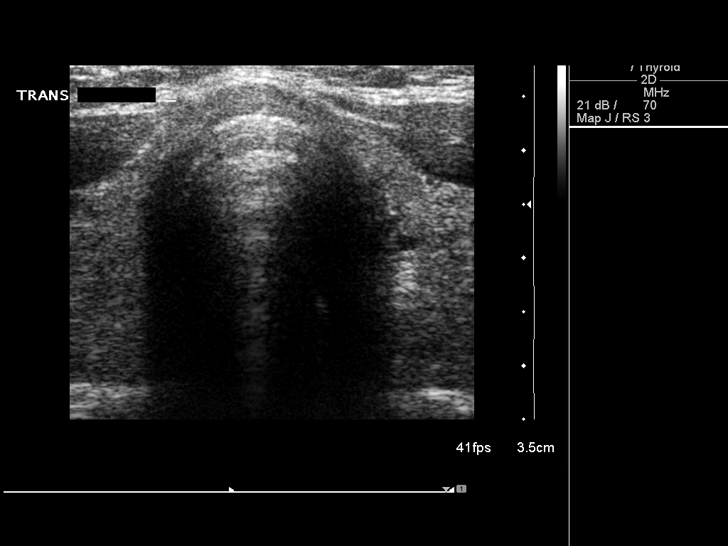
[im 5/54]
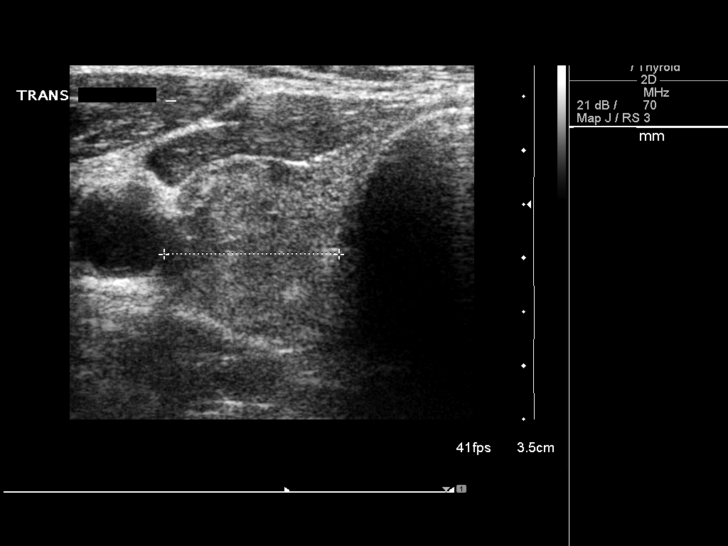
[im 9/54]
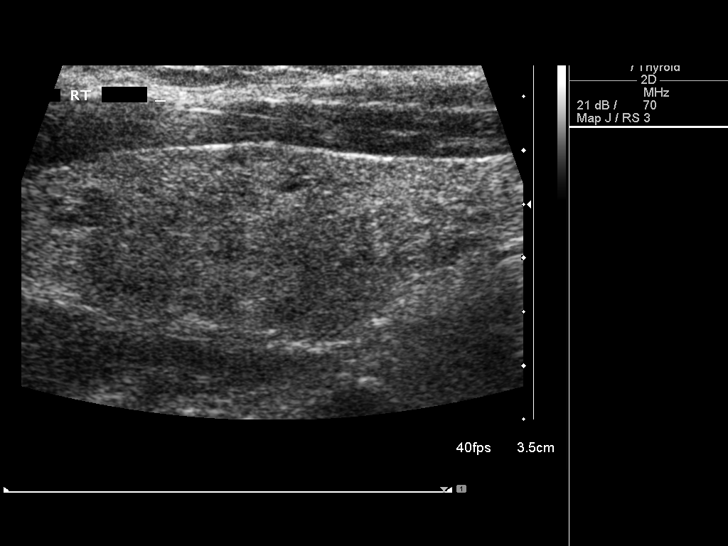
[im 14/54]
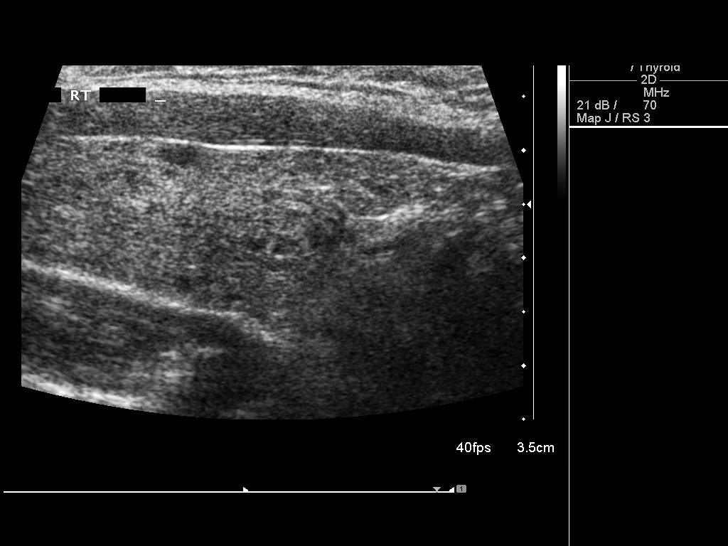
[im 18/54]
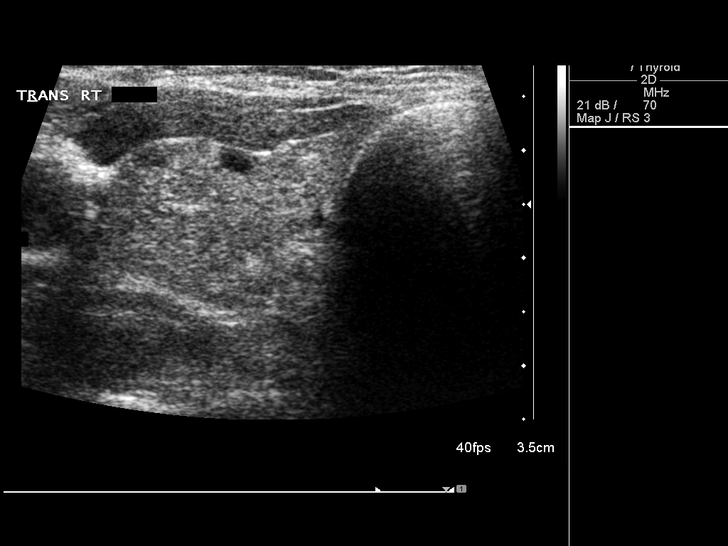
[im 20/54]
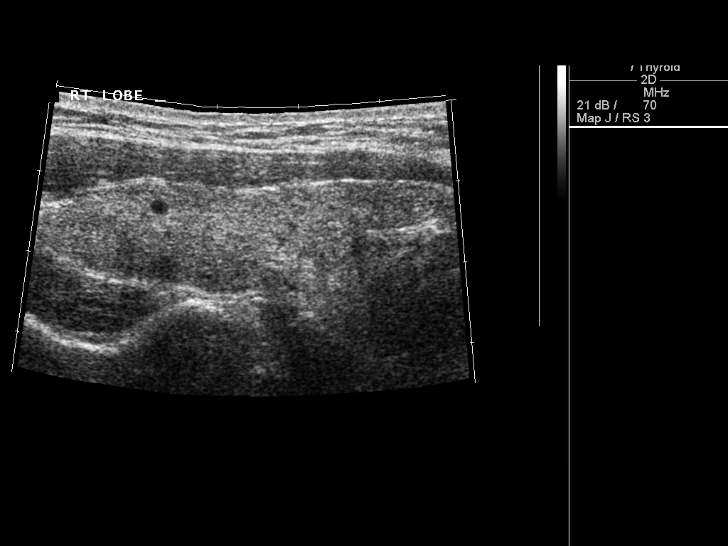
[im 25/54]
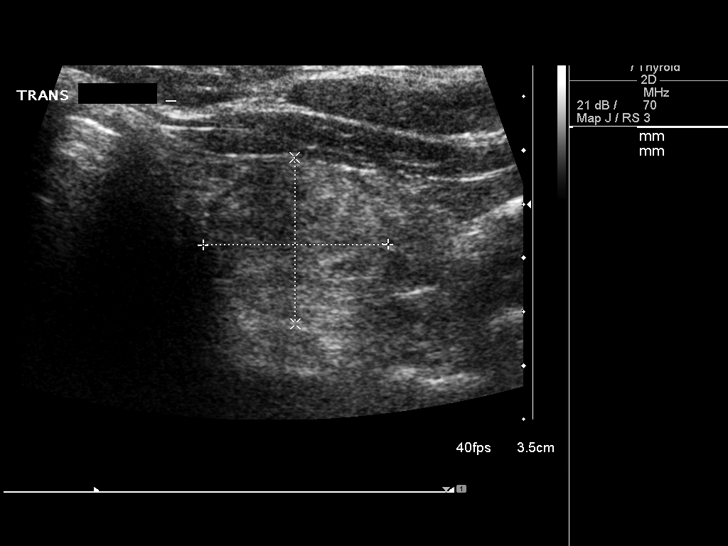
[im 29/54]
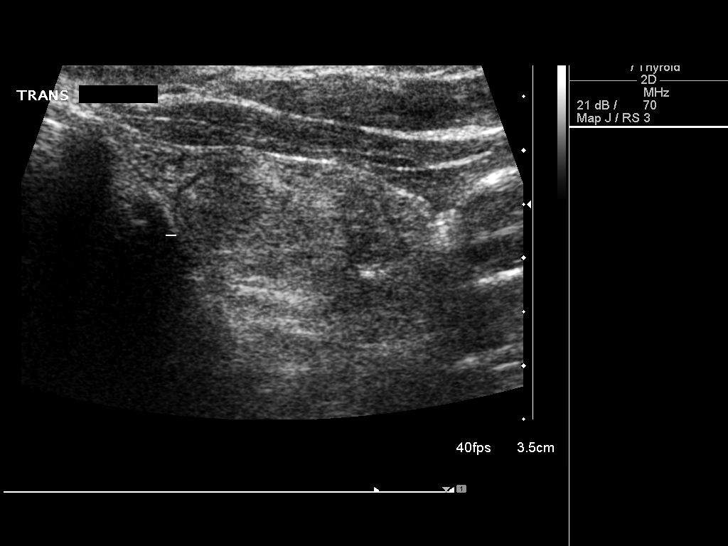
[im 34/54]
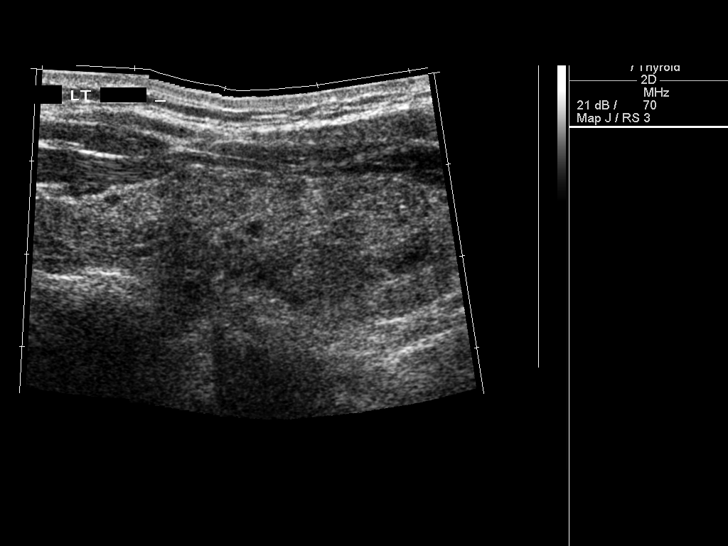
[im 36/54]
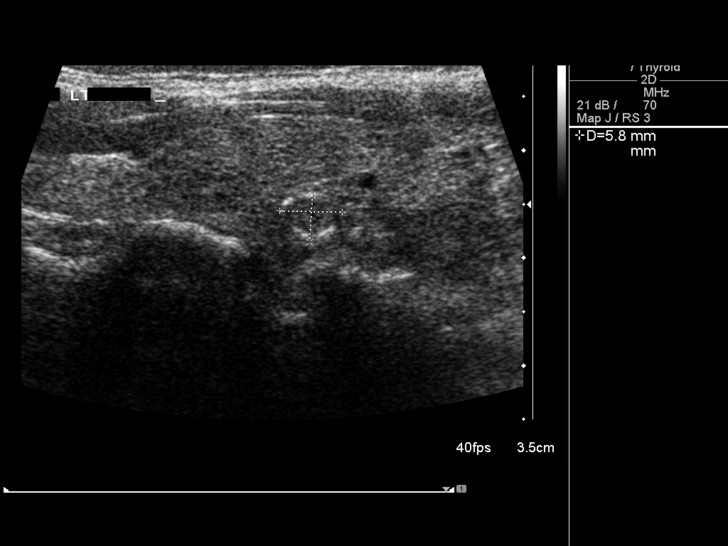
[im 40/54]
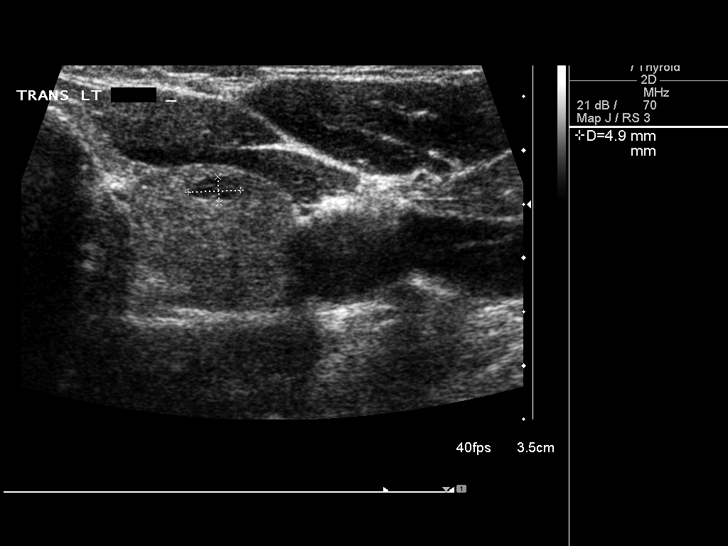
[im 45/54]
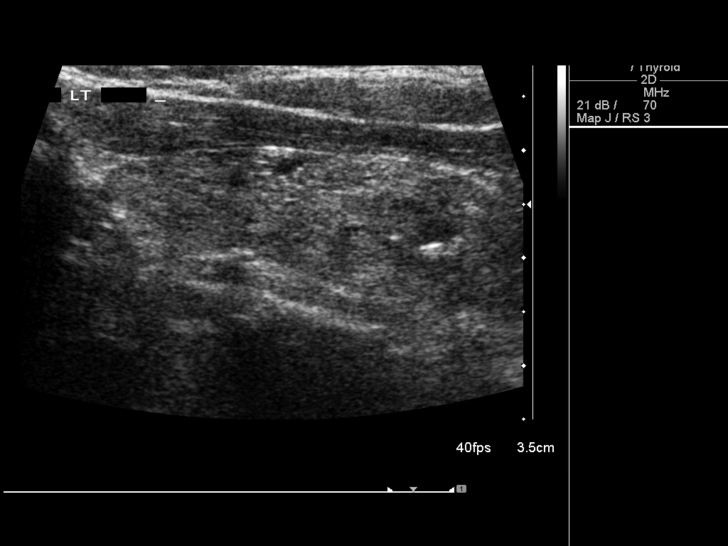
[im 49/54]
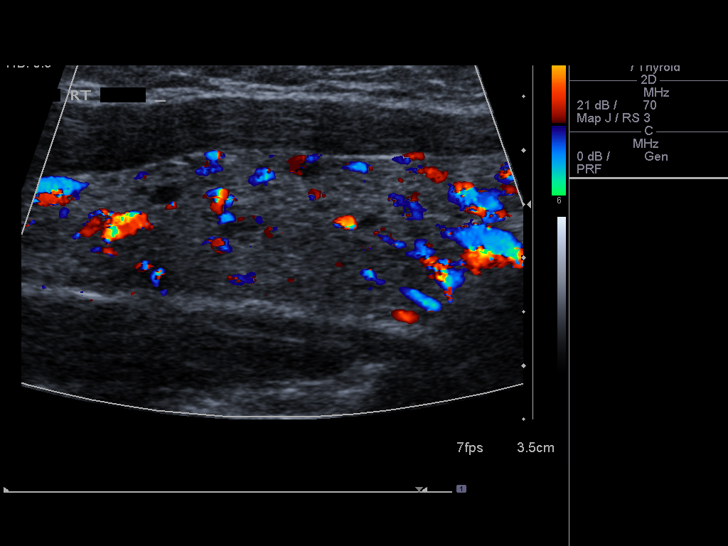
[im 54/54]
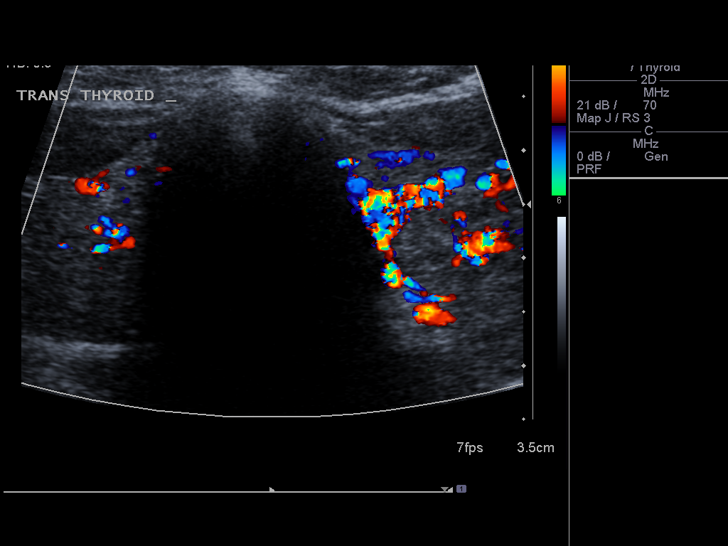

[14 of 25 positions shown; findings below may reference images not displayed]

FINDINGS: Right thyroid lobe

Measurements: 52 x 14 x 16 mm. 9 x 7 x 7 mm echogenic nodule, mid
lobe. 8 x 4 x 6 mm hypoechoic nodule, superior pole. 7 x 5 x 6 mm
hypoechoic nodule, deep mid lobe.

Left thyroid lobe

Measurements: 47 x 17 x 25 mm. Dominant 24 x 16 x 17 mm nodule,
inferior pole. 6 x 4 x 5 mm peripherally calcified nodule, mid lobe.
6 x 2 x 5 mm hypoechoic nodule, superior pole.

Isthmus

Thickness: 3 mm.  No nodules visualized.

Lymphadenopathy

None visualized.
IMPRESSION: 1. Mild thyromegaly with multiple nodules. The dominant 2.4 cm left
nodule meets consensus criteria for biopsy. Ultrasound-guided fine
needle aspiration should be considered, as per the consensus
statement: Management of Thyroid Nodules Detected at US: Society of
Radiologists in Ultrasound Consensus Conference Statement. Radiology

## 2017-03-07 ENCOUNTER — Other Ambulatory Visit: Payer: Self-pay | Admitting: Endocrinology

## 2017-03-17 ENCOUNTER — Other Ambulatory Visit: Payer: Self-pay | Admitting: Endocrinology

## 2017-03-19 ENCOUNTER — Telehealth: Payer: Self-pay | Admitting: Endocrinology

## 2017-03-19 ENCOUNTER — Other Ambulatory Visit: Payer: Self-pay | Admitting: Endocrinology

## 2017-03-19 ENCOUNTER — Telehealth: Payer: Self-pay

## 2017-03-19 MED ORDER — INSULIN DEGLUDEC 200 UNIT/ML ~~LOC~~ SOPN: 106.0000 [IU] | PEN_INJECTOR | Freq: Every day | SUBCUTANEOUS | 0 refills | Status: DC

## 2017-03-19 NOTE — Telephone Encounter (Signed)
° °  MEDICATION: TRESIBA FLEXTOUCH 200 UNIT/ML SOPN  PHARMACY:  [id number] [place],  - [address] [phone] (Phone) [phone] (Fax)       IS THIS A 90 DAY SUPPLY : Y  IS PATIENT OUT OF MEDICTAION: N  IF NOT; HOW MUCH IS LEFT: 2 days left  LAST APPOINTMENT DATE: 01/27/17  NEXT APPOINTMENT DATE: 04/28/17  OTHER COMMENTS: Patient also wanted to know if there was another option for Icosapent Ethyl (VASCEPA) 1 g CAPS. Patient stated It was too expensive. Please call patient and advise.    **Let patient know to contact pharmacy at the end of the day to make sure medication is ready. **  ** Please notify patient to allow 48-72 hours to process**  **Encourage patient to contact the pharmacy for refills or they can request refills through [facility]**

## 2017-03-19 NOTE — Telephone Encounter (Signed)
Please send co-pay card

## 2017-03-19 NOTE — Telephone Encounter (Signed)
Called patient advised of co-pay card. Patient will come by and pick it up. Placed up front with name on the co-pay card. No other questions.  

## 2017-03-19 NOTE — Telephone Encounter (Signed)
Please advise. [name] [name] was submitted;  But advise if there is another option for Vascepa 1g caps as they are too expensive for patient. Thank you!

## 2017-03-19 NOTE — Telephone Encounter (Signed)
Please advise if okay to continue Victoza 1.8 daily.

## 2017-03-19 NOTE — Telephone Encounter (Signed)
I verbally confirmed with Dr. Lucianne Muss about this and he confirmed that this dose is the same.

## 2017-03-19 NOTE — Telephone Encounter (Signed)
Called patient advised of co-pay card. Patient will come by and pick it up. Placed up front with name on the co-pay card. No other questions.

## 2017-03-22 ENCOUNTER — Other Ambulatory Visit: Payer: Self-pay

## 2017-03-22 ENCOUNTER — Telehealth: Payer: Self-pay | Admitting: Endocrinology

## 2017-03-22 MED ORDER — INSULIN GLARGINE 300 UNIT/ML ~~LOC~~ SOPN: 60.0000 [IU] | PEN_INJECTOR | Freq: Two times a day (BID) | SUBCUTANEOUS | 1 refills | Status: DC

## 2017-03-22 NOTE — Telephone Encounter (Signed)
Called patient and let him know that I have sent in his new prescription for the [facility]o [facility] by patient with the new instructions per Dr. Lucianne Muss and to please let us know if he needs anything else if this does not go through.

## 2017-03-22 NOTE — Telephone Encounter (Signed)
Patient called in reference to being out of Insulin Degludec (TRESIBA FLEXTOUCH) 200 UNIT/ML SOPN. Insurance no longer covers this. But patient stated they do cover an insulin that starts with a "J". Patient could not recall the name. Please call patient and advise on getting new insulin filled. OK to leave message.

## 2017-03-22 NOTE — Telephone Encounter (Signed)
Ok to call in the same dose of Toujeo.

## 2017-03-22 NOTE — Telephone Encounter (Signed)
Please advise dosage of Toujeo if okay. Please see previous notes. Thanks!

## 2017-03-22 NOTE — Telephone Encounter (Signed)
He can switch to [facility] but he will have to take 60 units twice a day.  Please call in the Toujeo-max insulin pen, 30 day supply

## 2017-03-22 NOTE — Telephone Encounter (Signed)
Please advise. I do not know why I sent this to Dr.G. Lucianne Muss patient, Thanks!

## 2017-03-22 NOTE — Telephone Encounter (Signed)
Please advise 

## 2017-03-26 ENCOUNTER — Other Ambulatory Visit: Payer: Self-pay | Admitting: Cardiology

## 2017-03-29 ENCOUNTER — Other Ambulatory Visit: Payer: Self-pay | Admitting: Cardiology

## 2017-03-31 ENCOUNTER — Other Ambulatory Visit: Payer: Self-pay | Admitting: Cardiology

## 2017-03-31 NOTE — Telephone Encounter (Signed)
Medication Detail    Disp Refills Start End   atorvastatin (LIPITOR) 20 MG tablet 90 tablet 2 03/26/2017    Sig: TAKE 1 TABLET BY MOUTH EVERY DAY   Sent to pharmacy as: atorvastatin (LIPITOR) 20 MG tablet   E-Prescribing Status: Receipt confirmed by pharmacy (03/26/2017 1:39 PM EDT)   Pharmacy   [id number] - [name], [place] - [address]    

## 2017-04-05 ENCOUNTER — Other Ambulatory Visit: Payer: Self-pay | Admitting: Cardiology

## 2017-04-07 ENCOUNTER — Other Ambulatory Visit: Payer: Self-pay | Admitting: Endocrinology

## 2017-04-07 NOTE — Telephone Encounter (Signed)
Medication Detail    Disp Refills Start End   atorvastatin (LIPITOR) 20 MG tablet 90 tablet 2 03/26/2017    Sig: TAKE 1 TABLET BY MOUTH EVERY DAY   Sent to pharmacy as: atorvastatin (LIPITOR) 20 MG tablet   E-Prescribing Status: Receipt confirmed by pharmacy (03/26/2017 1:39 PM EDT)   Pharmacy   [id number] - [name], [place] - [address]

## 2017-04-12 ENCOUNTER — Other Ambulatory Visit: Payer: Self-pay | Admitting: Cardiology

## 2017-04-14 ENCOUNTER — Other Ambulatory Visit: Payer: Self-pay | Admitting: Endocrinology

## 2017-04-16 ENCOUNTER — Other Ambulatory Visit: Payer: Self-pay | Admitting: Cardiology

## 2017-04-16 NOTE — Telephone Encounter (Signed)
Medication Detail    Disp Refills Start End   atorvastatin (LIPITOR) 20 MG tablet 90 tablet 2 03/26/2017    Sig: TAKE 1 TABLET BY MOUTH EVERY DAY   Sent to pharmacy as: atorvastatin (LIPITOR) 20 MG tablet   E-Prescribing Status: Receipt confirmed by pharmacy (03/26/2017 1:39 PM EDT)   Pharmacy   [id number] - [name], [place] - [address]    

## 2017-04-18 ENCOUNTER — Other Ambulatory Visit: Payer: Self-pay | Admitting: Cardiology

## 2017-04-19 ENCOUNTER — Other Ambulatory Visit: Payer: Self-pay

## 2017-04-19 MED ORDER — ATORVASTATIN CALCIUM 20 MG PO TABS: 20.0000 mg | ORAL_TABLET | Freq: Every day | ORAL | 2 refills | Status: DC

## 2017-04-19 NOTE — Telephone Encounter (Signed)
Medication Detail    Disp Refills Start End   atorvastatin (LIPITOR) 20 MG tablet 90 tablet 2 03/26/2017    Sig: TAKE 1 TABLET BY MOUTH EVERY DAY   Sent to pharmacy as: atorvastatin (LIPITOR) 20 MG tablet   E-Prescribing Status: Receipt confirmed by pharmacy (03/26/2017 1:39 PM EDT)   Pharmacy   [id number] - [name], [place] - [address]    

## 2017-04-20 ENCOUNTER — Telehealth: Payer: Self-pay | Admitting: Cardiology

## 2017-04-20 ENCOUNTER — Other Ambulatory Visit: Payer: Self-pay | Admitting: Cardiology

## 2017-04-20 MED ORDER — ATORVASTATIN CALCIUM 20 MG PO TABS: 20.0000 mg | ORAL_TABLET | Freq: Every day | ORAL | 2 refills | Status: DC

## 2017-04-20 NOTE — Addendum Note (Signed)
Addended by: Demetrios Loll on: 04/20/2017 03:31 PM   Modules accepted: Orders

## 2017-04-20 NOTE — Telephone Encounter (Signed)
New message     *STAT* If patient is at the pharmacy, call can be transferred to refill team.   1. Which medications need to be refilled? (please list name of each medication and dose if known) atorvastatin (LIPITOR) 20 MG tablet  2. Which pharmacy/location (including street and city if local pharmacy) is medication to be sent to[facility] [address] [place]  3. Do they need a 30 day or 90 day supply? Pt has appointment scheduled for November 29

## 2017-04-20 NOTE — Telephone Encounter (Signed)
Pt's medication was sent to pt's pharmacy as requested. Confirmation received.  °

## 2017-04-20 NOTE — Telephone Encounter (Signed)
Medication Detail    Disp Refills Start End   atorvastatin (LIPITOR) 20 MG tablet 90 tablet 2 04/19/2017    Sig - Route: Take 1 tablet (20 mg total) by mouth daily. - Oral   Sent to pharmacy as: atorvastatin (LIPITOR) 20 MG tablet   E-Prescribing Status: Receipt confirmed by pharmacy (04/19/2017 4:35 PM EDT)   Ph[facility]acy   [id number] [place], [place] - [address] AT [address] [facility]    Medication Detail    Disp Refills Start End   atorvastatin (LIPITOR) 20 MG tablet 90 tablet 2 03/26/2017    Sig: TAKE 1 TABLET BY MOUTH EVERY DAY   Sent to pharmacy as: atorvastatin (LIPITOR) 20 MG tablet   E-Prescribing Status: Receipt confirmed by pharmacy (03/26/2017 1:39 PM EDT)   Pharmacy   [id number] - [name], [place] - [address] AT [place] [address]

## 2017-04-28 ENCOUNTER — Other Ambulatory Visit: Payer: Self-pay | Admitting: Endocrinology

## 2017-04-28 ENCOUNTER — Ambulatory Visit (INDEPENDENT_AMBULATORY_CARE_PROVIDER_SITE_OTHER): Payer: Commercial Managed Care - PPO | Admitting: Endocrinology

## 2017-04-28 ENCOUNTER — Encounter: Payer: Self-pay | Admitting: Endocrinology

## 2017-04-28 VITALS — BP 140/88 | HR 75 | Ht 68.0 in | Wt 216.0 lb

## 2017-04-28 DIAGNOSIS — I10 Essential (primary) hypertension: Secondary | ICD-10-CM

## 2017-04-28 DIAGNOSIS — E1165 Type 2 diabetes mellitus with hyperglycemia: Secondary | ICD-10-CM | POA: Diagnosis not present

## 2017-04-28 DIAGNOSIS — Z794 Long term (current) use of insulin: Secondary | ICD-10-CM

## 2017-04-28 DIAGNOSIS — E782 Mixed hyperlipidemia: Secondary | ICD-10-CM | POA: Diagnosis not present

## 2017-04-28 LAB — COMPREHENSIVE METABOLIC PANEL
ALBUMIN: 4.5 g/dL (ref 3.5–5.2)
ALK PHOS: 33 U/L — AB (ref 39–117)
ALT: 22 U/L (ref 0–53)
AST: 16 U/L (ref 0–37)
BUN: 11 mg/dL (ref 6–23)
CALCIUM: 9.4 mg/dL (ref 8.4–10.5)
CO2: 27 mEq/L (ref 19–32)
CREATININE: 0.61 mg/dL (ref 0.40–1.50)
Chloride: 105 mEq/L (ref 96–112)
GFR: 141.08 mL/min (ref >60.00)
Glucose, Bld: 172 mg/dL — ABNORMAL HIGH (ref 70–99)
Potassium: 4.2 mEq/L (ref 3.5–5.1)
SODIUM: 138 meq/L (ref 135–145)
Total Bilirubin: 0.8 mg/dL (ref 0.2–1.2)
Total Protein: 7 g/dL (ref 6.0–8.3)

## 2017-04-28 LAB — POCT GLYCOSYLATED HEMOGLOBIN (HGB A1C): Hemoglobin A1C: 6.6

## 2017-04-28 LAB — TSH: TSH: 2.11 u[IU]/mL (ref 0.35–4.50)

## 2017-04-28 NOTE — Progress Notes (Signed)
Patient ID: [name] [name], male   DOB: 13-Aug-1950, 66 y.o.   MRN: [id number]           Reason for Appointment: Follow-up for Type 2 Diabetes   History of Present Illness:          Date of diagnosis of type 2 diabetes mellitus: 2011        Background history:  He was probably started on metformin at diagnosis and subsequently given Amaryl also Subsequently had been on Janumet and more recently this was changed to [facility]. Because of poor control he was given Levemir in addition to his Kombiglyze in 01/2013 The dose was progressively increased from initial dose of 12 units He did have somewhat better control right after starting insulin with A1c below 7% but only one time His blood sugars had been consistently high since 2014 with A1c at least 8%  Recent history:   INSULIN regimen is described as: Toujeo 60 bid, previously on Tresiba 106 units at bedtime  Humalog at suppertime 10  units  Non-insulin hypoglycemic drugs the patient is taking are: Invokana 300 every other day  Metformin ER 2 g, Victoza 1.8 mg daily   His A1c is 6.6, previously 7.2 %  Current blood sugar patterns, management and problems identified:  He was advised to take Humalog more regularly at suppertime when he was here 3 months ago  He thinks he is doing that before eating more consistently  However has only 2 readings AFTER his evening meal which were over 200  He thinks his blood sugars may not be as good because of his not finding the time to exercise or walk as usual  Also he was told to take his [facility] everyday but he is taking this only every other day  FASTING blood sugars recently are mildly increased although still somewhat better on an average compared to last time  About 6 weeks ago he was changed from [place] to [place] because of lack of insurance coverage and this has been started at 60 twice a day  Side effects from medications have been: ?  Dizziness from Invokana  Hypoglycemia:  None    Glucose monitoring:  done less than once  in a day         Glucometer:  contour Blood Glucose readings from download:   Mean values apply above for all meters except median for One Touch  PRE-MEAL Fasting Lunch Dinner Bedtime Overall  Glucose range: 127-179    209, 218    Mean/median: 158         Self-care: The diet that the patient has been following is: tries to limit carbs.     Meal times: Breakfast: 9 AM Lunch: 1 PM Dinner: 6 PM   Typical meal intake:Lunch is a sandwich or vegetables, variable meal at suppertime.  For snacks will have popcorn               Dietician visit, most recent: 6/16               Exercise: not walking, Previously was doing this 4-5/7 days     Weight history: Highest  previously 256  Wt Readings from Last 3 Encounters:  04/28/17 216 lb (98 kg)  01/27/17 214 lb 12.8 oz (97.4 kg)  12/02/16 213 lb 6.4 oz (96.8 kg)    Glycemic control:   Lab Results  Component Value Date   HGBA1C 6.6 04/28/2017   HGBA1C 7.0 10/27/2016   HGBA1C 6.6 07/08/2016  Lab Results  Component Value Date   MICROALBUR 0.4 01/27/2017   LDLCALC 64 04/25/2014   CREATININE 0.62 01/27/2017    Other active problems: See review of systems    Allergies as of 04/28/2017      Reactions   Rosuvastatin Other (See Comments)   REACTION: myalgias      Medication List       Accurate as of 04/28/17 12:45 PM. Always use your most recent med list.          aspirin 81 MG tablet Take 81 mg by mouth daily.   atorvastatin 20 MG tablet Commonly known as:  LIPITOR Take 1 tablet (20 mg total) by mouth daily.   CONTOUR NEXT EZ MONITOR w/Device Kit TEST BLOOD SUGAR ONCE DAILY   glucose blood test strip Commonly known as:  BAYER CONTOUR NEXT TEST TEST BLOOD SUGAR ONCE DAILY   Icosapent Ethyl 1 g Caps Commonly known as:  VASCEPA Take 2 capsules by mouth 2 (two) times daily.   insulin lispro 100 UNIT/ML KiwkPen Commonly known as:  HUMALOG Inject 6-12 Units into the  skin daily before supper.   INVOKANA 300 MG Tabs tablet Generic drug:  canagliflozin Take 150 mg by mouth every other day.   metFORMIN 500 MG 24 hr tablet Commonly known as:  GLUCOPHAGE-XR TAKE 4 TABLETS (2,000 MG TOTAL) BY MOUTH DAILY WITH SUPPER.   MICROLET LANCETS Misc TEST BLOOD SUGAR ONCE DAILY   NOVOFINE 32G X 6 MM Misc Generic drug:  Insulin Pen Needle USE TO INJECT INSULIN 2 TIMES PER DAY.   TOUJEO MAX SOLOSTAR 300 UNIT/ML Sopn Generic drug:  Insulin Glargine INJECT 60 UNITS INTO THE SKIN 2 (TWO) TIMES DAILY.   VICTOZA 18 MG/3ML Sopn Generic drug:  liraglutide INJECT 1.8 MG ONCE DAILY AT THE SAME TIME            Discharge Care Instructions        Start     Ordered   04/28/17 0000  POCT glycosylated hemoglobin (Hb A1C)     04/28/17 1036   04/28/17 0000  Comprehensive metabolic panel     76/81/15 1036   04/28/17 0000  TSH     04/28/17 1036      Allergies:  Allergies  Allergen Reactions  . Rosuvastatin Other (See Comments)    REACTION: myalgias    Past Medical History:  Diagnosis Date  . Aortic valve sclerosis    echo june 2009  . CAD (coronary artery disease)    a. 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 b. s/p CABG 04/2011.  . Carotid artery disease ([place])    Doppler, preop, September, 2012,  mild bilateral plaque with no significant stenoses  . Diabetes mellitus   . Dizziness    June, 2013  . Ejection fraction    a. Ejection fraction 60%, echo, June, 2009. b. EF preserved by cath 2012.  [name] Food poisoning    age 49  . GERD (gastroesophageal reflux disease)   . Hx of CABG    Roxy Manns,  x4  May 01, 2011 limited to the LAD, left radial artery to the OM, SVG to diagonal, SVG to posterior descending  . Hyperlipidemia   . Hypertension   . Muscle ache    on Crestor  09/04/09 CPK 193(7-232) tolerated simvastatin in the past  . PVC (premature ventricular contraction) 06/2009   November, 2010  . Rash      Past Surgical History:  Procedure Laterality Date  . BACK SURGERY    . coccyx bone removed    . CORONARY ARTERY BYPASS GRAFT  05/01/2011   CABG x4 with LIMA to LAD, LRA to OM, SVG to D1, SVG to PDA, EVH via left thigh  . Intraoperative transesophageal echocardiography.  05/01/2011  . pilonidal cystectomy      Family History  Problem Relation Age of Onset  . Heart attack Paternal Grandfather        late 35s  . Hearing loss Paternal Grandfather   . Hypertension Father   . Heart attack Father 58       bypass  . Diabetes Father   . Hearing loss Father   . Thyroid disease Father   . Cancer Maternal Grandfather        site unknown  . Prostate cancer Maternal Grandfather   . Diabetes Paternal Aunt        X 3  . Heart failure Maternal Grandmother   . Diabetes Mother   . Diabetes Paternal Uncle   . Stroke Neg Hx     Social History:  reports that he has never smoked. He has never used smokeless tobacco. He reports that he does not drink alcohol or use drugs.    Review of Systems   Lipid history: He has been On Vascepa for significantly high triglycerides along with his Lipitor  Triglycerides have been significantly high, Better on the last visit with Vascepa However he ran out of his Lipitor 3 weeks ago and has not started back yet because of insurance and pharmacy issues LDL usually under control    Lab Results  Component Value Date   CHOL 114 01/27/2017   HDL 29.80 (L) 01/27/2017   LDLCALC 64 04/25/2014   LDLDIRECT 53.0 01/27/2017   TRIG 225.0 (H) 01/27/2017   CHOLHDL 4 01/27/2017            THYROID: He has a 2.4 cm dominant left-sided thyroid nodule on Ultrasound in 12/2014  Follow-up showed the same size of the nodules TSH not done this year yet  HYPERTENSION:  Previously on  2.5 mg ramipril and this was stopped when he was put on Invokana    He was told to stop ramipril with taking Invokana on the last visit but on his own he is taking a regimen of ramipril  alternating with Invokana every other day He checks his blood pressure 2 or 3 times a week at the drugstore or at his parent's home and he thinks it is usually in the 024O systolic and 97D diastolic   BP Readings from Last 3 Encounters:  04/28/17 (!) 140/92  01/27/17 138/86  12/02/16 134/86       LABS:  Office Visit on 04/28/2017  Component Date Value Ref Range Status  . Hemoglobin A1C 04/28/2017 6.6   Final    Physical Examination:  BP (!) 140/92   Pulse 75   Ht _0  (1.727 m)   Wt 216 lb (98 kg)   SpO2 97%   BMI 32.84 kg/m      ASSESSMENT:  Diabetes type 2 on insulin    See history of present illness for detailed discussion of  current management, blood sugar patterns and problems identified  He has insulin deficiency and insulin resistance requiring very large doses of basal insulin even in combination with Humalog at suppertime and Invokana + Victoza Difficult to know how to adjust his Humalog doses as he is rarely checking after dinner Recent  readings are high after dinner Fasting readings are also overall high  HYPERTENSION: Blood pressure is reportedly fairly consistently controlled when he checks his reading at the drugstore and higher in the office    LIPIDS to be rechecked on his next visit since he has not taken his Lipitor regularly recently, also not taking his Vascepa because of insurance not covering this  Discussed need for keeping triglycerides in the control and also Vascepa apparently does seem to have now shown to have CAD protection data   PLAN:   He can take the full tablets of 300 mg Invokana daily  Stop ramipril for now  Increase Toujeo and try to take 124 units in the evening once a day, most likely this may work over 24 hours  Increase Humalog by 2-4 units especially if eating larger meals with more carbohydrate  Since his insurance will change again in November will not make any other basic changes  Restart Lipitor and take fish  oil, 4 capsules daily OTC   Patient Instructions  Toujeo 124 units at dinner with 12-14 units Humalog  Check blood sugars on waking up 5/7 days   Also check blood sugars about 2 hours after a meal and do this after different meals by rotation  Recommended blood sugar levels on waking up is 90-130 and about 2 hours after meal is 130-160  Please bring your blood sugar monitor to each visit, thank you  Take Invokana daily  No Ramipril  Fish oil 4 caps daily     [name] [name] 04/28/2017, 12:45 PM   Note: This office note was prepared with Dragon voice recognition system technology. Any transcriptional errors that result from this process are unintentional.  Total visit time for evaluation and management of multiple problems, review of medications, planning future medication coverage with insurance difficulties, labs, counseling = 25 minutes

## 2017-04-28 NOTE — Patient Instructions (Addendum)
Toujeo 124 units at dinner with 12-14 units Humalog  Check blood sugars on waking up 5/7 days   Also check blood sugars about 2 hours after a meal and do this after different meals by rotation  Recommended blood sugar levels on waking up is 90-130 and about 2 hours after meal is 130-160  Please bring your blood sugar monitor to each visit, thank you  Take Invokana daily  No Ramipril  Fish oil 4 caps daily

## 2017-05-12 ENCOUNTER — Other Ambulatory Visit: Payer: Self-pay | Admitting: Endocrinology

## 2017-06-03 ENCOUNTER — Other Ambulatory Visit: Payer: Self-pay | Admitting: Endocrinology

## 2017-06-15 ENCOUNTER — Other Ambulatory Visit: Payer: Medicare HMO

## 2017-06-15 DIAGNOSIS — Z125 Encounter for screening for malignant neoplasm of prostate: Secondary | ICD-10-CM | POA: Diagnosis not present

## 2017-06-15 DIAGNOSIS — I10 Essential (primary) hypertension: Secondary | ICD-10-CM | POA: Diagnosis not present

## 2017-06-15 DIAGNOSIS — E7849 Other hyperlipidemia: Secondary | ICD-10-CM | POA: Diagnosis not present

## 2017-06-15 DIAGNOSIS — E119 Type 2 diabetes mellitus without complications: Secondary | ICD-10-CM | POA: Diagnosis not present

## 2017-06-17 ENCOUNTER — Other Ambulatory Visit: Payer: Self-pay | Admitting: Endocrinology

## 2017-06-21 DIAGNOSIS — Z794 Long term (current) use of insulin: Secondary | ICD-10-CM | POA: Diagnosis not present

## 2017-06-21 DIAGNOSIS — I6529 Occlusion and stenosis of unspecified carotid artery: Secondary | ICD-10-CM | POA: Diagnosis not present

## 2017-06-21 DIAGNOSIS — I25810 Atherosclerosis of coronary artery bypass graft(s) without angina pectoris: Secondary | ICD-10-CM | POA: Diagnosis not present

## 2017-06-21 DIAGNOSIS — I493 Ventricular premature depolarization: Secondary | ICD-10-CM | POA: Diagnosis not present

## 2017-06-21 DIAGNOSIS — Z0000 Encounter for general adult medical examination without abnormal findings: Secondary | ICD-10-CM | POA: Diagnosis not present

## 2017-06-21 DIAGNOSIS — E7849 Other hyperlipidemia: Secondary | ICD-10-CM | POA: Diagnosis not present

## 2017-06-21 DIAGNOSIS — I119 Hypertensive heart disease without heart failure: Secondary | ICD-10-CM | POA: Diagnosis not present

## 2017-06-21 DIAGNOSIS — E668 Other obesity: Secondary | ICD-10-CM | POA: Diagnosis not present

## 2017-06-21 DIAGNOSIS — E042 Nontoxic multinodular goiter: Secondary | ICD-10-CM | POA: Diagnosis not present

## 2017-06-21 DIAGNOSIS — E119 Type 2 diabetes mellitus without complications: Secondary | ICD-10-CM | POA: Diagnosis not present

## 2017-06-23 ENCOUNTER — Other Ambulatory Visit: Payer: Self-pay | Admitting: Endocrinology

## 2017-06-25 DIAGNOSIS — Z1212 Encounter for screening for malignant neoplasm of rectum: Secondary | ICD-10-CM | POA: Diagnosis not present

## 2017-07-01 ENCOUNTER — Ambulatory Visit: Payer: Medicare HMO | Admitting: Cardiology

## 2017-07-01 ENCOUNTER — Encounter: Payer: Self-pay | Admitting: Cardiology

## 2017-07-01 VITALS — BP 148/88 | HR 88 | Ht 68.0 in | Wt 216.0 lb

## 2017-07-01 DIAGNOSIS — I2583 Coronary atherosclerosis due to lipid rich plaque: Secondary | ICD-10-CM | POA: Diagnosis not present

## 2017-07-01 DIAGNOSIS — E782 Mixed hyperlipidemia: Secondary | ICD-10-CM

## 2017-07-01 DIAGNOSIS — I779 Disorder of arteries and arterioles, unspecified: Secondary | ICD-10-CM

## 2017-07-01 DIAGNOSIS — I2510 Atherosclerotic heart disease of native coronary artery without angina pectoris: Secondary | ICD-10-CM | POA: Diagnosis not present

## 2017-07-01 DIAGNOSIS — E1165 Type 2 diabetes mellitus with hyperglycemia: Secondary | ICD-10-CM

## 2017-07-01 DIAGNOSIS — E1151 Type 2 diabetes mellitus with diabetic peripheral angiopathy without gangrene: Secondary | ICD-10-CM | POA: Diagnosis not present

## 2017-07-01 DIAGNOSIS — I739 Peripheral vascular disease, unspecified: Secondary | ICD-10-CM

## 2017-07-01 DIAGNOSIS — IMO0002 Reserved for concepts with insufficient information to code with codable children: Secondary | ICD-10-CM

## 2017-07-01 NOTE — Patient Instructions (Signed)

## 2017-07-01 NOTE — Progress Notes (Signed)
Cardiology Office Note   Date:  07/01/2017   ID:  [name] [name], DOB 1951/01/16, MRN [id number]  PCP:  Shon Baton, MD  Cardiologist:  Candee Furbish, MD  Endocrine: Dr. Dwyane Dee      History of Present Illness: [name] [name] is a 66 y.o. male former patient of Dr. Ron Parker who presents today to follow-up CAD/CABG artery disease and hyperlipidemia, former PCI to RCA  prior to his bypass surgery.    He tells me that he previously had nighttime leg cramps at first. This improved over time. He is stable now. His follow-up labs reveal an LDL of 56. TSH was normal. Unfortunately his triglycerides are high. I'm sure that goes along with his diabetes. He has been seeing Dr. Dwyane Dee, endocrinology.  This is improved since improving his diabetes.  Triglycerides 544 on 04/15/15, hemoglobin A1c 7.5.  Triglycerides are  280 on 06/15/17.  Invokana.  Never had symptoms prior to CABG. Dr. Linna Darner did ETT. Abnormal. First RCA stent in 2004 then CABG 2012.   I saw father as well who has passed. His father has had carotid endarterectomy.  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 Takes trips to the [place] occasionally travel.  12/02/16-overall doing quite well, no chest pain, no shortness of breath, no syncope, no bleeding. Only problems, hard to work and take meds for DM. Dissatisfied personally with it. Feeling aches and pain. One time when pulling his fast boat in he feels like he strained his chest wall. He forgets that he has had bypass surgery. This dissipated after 2 or 3 days. He denies any syncope, chest pain, shortness of breath. Taking his medications well.  07/01/17- he had an experience when he was walking uphill for about 1-1/2 miles after a [employer] where he had to slow down because of shortness of breath.  Usually he is quite active and has no trouble before or since then.  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  He has been grieving the loss of his father who was a patient of mine.  Past Medical History:  Diagnosis Date  . Aortic valve sclerosis    echo june 2009  . CAD (coronary artery disease)    a. 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 b. s/p CABG 04/2011.  . Carotid artery disease ([place])    Doppler, preop, September, 2012,  mild bilateral plaque with no significant stenoses  . Diabetes mellitus   . Dizziness    June, 2013  . Ejection fraction    a. Ejection fraction 60%, echo, June, 2009. b. EF preserved by cath 2012.  [name] Food poisoning    age 33  . GERD (gastroesophageal reflux disease)   . Hx of CABG    Roxy Manns,  x4  May 01, 2011 limited to the LAD, left radial artery to the OM, SVG to diagonal, SVG to posterior descending  . Hyperlipidemia   . Hypertension   . Muscle ache    on Crestor  09/04/09 CPK 193(7-232) tolerated simvastatin in the past  . PVC (premature ventricular contraction) 06/2009   November, 2010  . Rash     Past Surgical History:  Procedure Laterality Date  . BACK SURGERY    . coccyx bone removed    . CORONARY ARTERY BYPASS GRAFT  05/01/2011   CABG x4 with LIMA to LAD, LRA to  OM, SVG to D1, SVG to PDA, EVH via left thigh  . Intraoperative transesophageal echocardiography.  05/01/2011  . pilonidal cystectomy      Patient Active Problem List   Diagnosis Date Noted  . Dizziness   . Carotid artery disease ([place])   . Hx of CABG   . CAD (coronary artery disease)   . Hyperlipidemia   . Aortic valve sclerosis   . GERD (gastroesophageal reflux disease)   . Muscle ache   . Ejection fraction   . KNEE PAIN, LEFT, CHRONIC 05/21/2010  . PVC (premature ventricular contraction) 06/03/2009  . Carotid bruit 06/03/2009  . Type II diabetes mellitus with peripheral circulatory disorder, uncontrolled ([place]) 03/27/2008  . Essential hypertension 03/27/2008      Current Outpatient Medications  Medication Sig  Dispense Refill  . aspirin 81 MG tablet Take 81 mg by mouth daily.      [name] atorvastatin (LIPITOR) 20 MG tablet Take 1 tablet (20 mg total) by mouth daily. 90 tablet 2  . Blood Glucose Monitoring Suppl (CONTOUR NEXT EZ MONITOR) w/Device KIT TEST BLOOD SUGAR ONCE DAILY 1 kit 2  . canagliflozin (INVOKANA) 300 MG TABS tablet Take 150 mg by mouth every other day.    [name] glucose blood (BAYER CONTOUR NEXT TEST) test strip TEST BLOOD SUGAR ONCE DAILY 100 each 2  . insulin lispro (HUMALOG KWIKPEN) 100 UNIT/ML KiwkPen Inject 12-14 units before supper. 15 mL 3  . metFORMIN (GLUCOPHAGE-XR) 500 MG 24 hr tablet TAKE 4 TABLETS (2,000 MG TOTAL) BY MOUTH DAILY WITH SUPPER. 360 tablet 1  . MICROLET LANCETS MISC TEST BLOOD SUGAR ONCE DAILY 100 each 2  . NOVOFINE 32G X 6 MM MISC USE TO INJECT INSULIN 2 TIMES PER DAY. 100 each 1  . ramipril (ALTACE) 2.5 MG capsule TAKE 1 CAPSULE (2.5 MG TOTAL) BY MOUTH DAILY. 90 capsule 1  . TOUJEO MAX SOLOSTAR 300 UNIT/ML SOPN INJECT 60 UNITS INTO THE SKIN 2 (TWO) TIMES DAILY. 9 pen 1  . VICTOZA 18 MG/3ML SOPN INJECT 1.8 MG ONCE DAILY AT THE SAME TIME 27 mL 1  . Icosapent Ethyl (VASCEPA) 1 g CAPS Take 2 capsules by mouth 2 (two) times daily. (Patient not taking: Reported on 07/01/2017) 120 capsule 1   No current facility-administered medications for this visit.     Allergies:   Rosuvastatin    Social History:  The patient  reports that  has never smoked. he has never used smokeless tobacco. He reports that he does not drink alcohol or use drugs.   Family History:  The patient's family history includes Cancer in his maternal grandfather; Diabetes in his father, mother, paternal aunt, and paternal uncle; Hearing loss in his father and paternal grandfather; Heart attack in his paternal grandfather; Heart attack (age of onset: 82) in his father; Heart failure in his maternal grandmother; Hypertension in his father; Prostate cancer in his maternal grandfather; Thyroid disease in his  father.    ROS:  Please see the history of present illness.     Patient denies fever, chills, headache, sweats, rash, change in vision, change in hearing, chest pain, cough, nausea or vomiting, urinary symptoms. All other systems are reviewed and are negative.   PHYSICAL EXAM: VS:  BP (!) 148/88   Pulse 88   Ht 5' 8"  (1.727 m)   Wt 216 lb (98 kg)   SpO2 98%   BMI 32.84 kg/m  , GEN: Well nourished, well developed, in no acute distress  HEENT: normal  Neck: no JVD, carotid bruits, or masses Cardiac: RRR; no murmurs, rubs, or gallops,no edema  Respiratory:  clear to auscultation bilaterally, normal work of breathing GI: soft, nontender, nondistended, + BS MS: no deformity or atrophy  Skin: warm and dry, no rash Neuro:  Alert and Oriented x 3, Strength and sensation are intact Psych: euthymic mood, full affect   EKG:   EKG ordered today 12/02/16-sinus rhythm 77 with nonspecific T-wave changes, subtle T-wave inversion in V6, QT interval 412 ms. Personally viewed. 06/12/15-sinus rhythm, 68, nonspecific ST-T wave changes, no other abnormalities. Poor R wave progression.  Echocardiogram: 01/24/2008  - Normal ejection fraction  - Mild aortic regurgitation  - Mild mitral regurgitation  Carotid ultrasound 04/28/2011  - Mild bilateral plaque  Recent Labs: 04/28/2017: ALT 22; BUN 11; Creatinine, Ser 0.61; Potassium 4.2; Sodium 138; TSH 2.11    Lipid Panel    Component Value Date/Time   CHOL 114 01/27/2017 1145   TRIG 225.0 (H) 01/27/2017 1145   HDL 29.80 (L) 01/27/2017 1145   CHOLHDL 4 01/27/2017 1145   VLDL 45.0 (H) 01/27/2017 1145   LDLCALC 64 04/25/2014 0957   LDLDIRECT 53.0 01/27/2017 1145      Wt Readings from Last 3 Encounters:  07/01/17 216 lb (98 kg)  04/28/17 216 lb (98 kg)  01/27/17 214 lb 12.8 oz (97.4 kg)      Current medicines are reviewed  The patient understands his medications. Reviewed prior office notes, labs, ECG     ASSESSMENT AND PLAN:  Coronary  artery disease/status post CABG  -  Overall doing quite well.  He did have an episode when he was walking uphill at [place] where he felt some shortness of breath which is unusual for him.  He has not felt anything since.  I told him that if he begins to feel this way again to let us know and we will have low threshold for stress test.  Hyperlipidemia  - Dr. Ron Parker changed to a high potency statin, 20 mg of atorvastatin.  Continues to take this.  Overall doing quite well.  Dr. Dwyane Dee has given him Vascepa in the past but I do not see this on his current medication list.  His triglycerides have improved. Continue. His triglycerides were 505 on 10/27/16. Direct LDL was 48 however. Hemoglobin A1c 7.0. He understands.  Diabetes with peripheral vascular disease/carotid artery plaque  - Dr. Dwyane Dee has been working with him.  - Triglycerides have not improved as much as I would've expected with his decrease in hemoglobin A1c. Mild plaque.  Had been on Vascepa in the past.   Carotid artery plaque  - Mild plaque noted on ultrasound. Continue with aggressive secondary prevention. He was concerned because his father had a severe carotid artery stenosis that required carotid endarterectomy in his late 63s.  - No changes.  If his grief does not improve over the next several weeks, consider discussing this with Dr. Virgina Jock.  He does not seem to be very interested in potentially taking SSRI however.  I encouraged him to continue with exercise.  Follow up 1 year.  Candee Furbish, MD

## 2017-07-05 ENCOUNTER — Ambulatory Visit (INDEPENDENT_AMBULATORY_CARE_PROVIDER_SITE_OTHER): Payer: Medicare HMO

## 2017-07-05 ENCOUNTER — Ambulatory Visit (INDEPENDENT_AMBULATORY_CARE_PROVIDER_SITE_OTHER): Payer: Medicare HMO | Admitting: Orthopedic Surgery

## 2017-07-05 ENCOUNTER — Encounter (INDEPENDENT_AMBULATORY_CARE_PROVIDER_SITE_OTHER): Payer: Self-pay | Admitting: Orthopedic Surgery

## 2017-07-05 DIAGNOSIS — M25562 Pain in left knee: Secondary | ICD-10-CM | POA: Diagnosis not present

## 2017-07-05 DIAGNOSIS — G8929 Other chronic pain: Secondary | ICD-10-CM | POA: Diagnosis not present

## 2017-07-09 ENCOUNTER — Encounter (INDEPENDENT_AMBULATORY_CARE_PROVIDER_SITE_OTHER): Payer: Self-pay | Admitting: Orthopedic Surgery

## 2017-07-09 NOTE — Progress Notes (Signed)
Office Visit Note   Patient: [name] SprinklesHarold D Yamamoto           Date of Birth: July 29, 1951           MRN: [id number] Visit Date: 07/05/2017 Requested by: Creola Cornusso, John, MD [address] [place], [place] [id number] PCP: Creola Cornusso, John, MD  Subjective: Chief Complaint  Patient presents with  . 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  Approximately 6 years ago he had aspiration and injection in the left knee with good relief.  Now for the last 2-3 months he reports increasing severity of his pain.  She can walk fine but has difficulty with squats and stairs.  He reports primarily global pain in the knee as well as posterior pain.  He walks for exercise.  He does this according to time and not distance.  He has insulin-dependent diabetes.  He takes occasional Tylenol for his pain.  Globin A1c 6.8 by his report.              ROS: All systems reviewed are negative as they relate to the chief complaint within the history of present illness.  Patient denies  fevers or chills.   Assessment & Plan: Visit Diagnoses:  1. Chronic pain of left knee     Plan: Impression is end-stage medial compartment knee arthritis with global knee pain.  Does have a small flexion contracture.  We will try an injection as this has helped him before.  He may need knee replacement in the future but for now he wants to focus on nonweightbearing quad strengthening exercises.  Follow-up with me in 8 weeks for clinical recheck and consideration of other options including gel injection.  Follow-Up Instructions: Return in about 8 weeks (around 08/30/2017).   Orders:  Orders Placed This Encounter  Procedures  . XR KNEE 3 VIEW LEFT   No orders of the defined types were placed in this encounter.     Procedures: Large Joint Inj: L knee on 07/10/2017 12:01 AM Indications: diagnostic evaluation, joint swelling and pain Details: 18 G 1.5 in needle, superolateral approach  Arthrogram:  No  Medications: 5 mL lidocaine 1 %; 40 mg methylPREDNISolone acetate 40 MG/ML; 4 mL bupivacaine 0.25 % Outcome: tolerated well, no immediate complications Procedure, treatment alternatives, risks and benefits explained, specific risks discussed. Consent was given by the patient. Immediately prior to procedure a time out was called to verify the correct patient, procedure, equipment, support staff and site/side marked as required. Patient was prepped and draped in the usual sterile fashion.       Clinical Data: No additional findings.  Objective: Vital Signs: There were no vitals taken for this visit.  Physical Exam:   Constitutional: Patient appears well-developed HEENT:  Head: Normocephalic Eyes:EOM are normal Neck: Normal range of motion Cardiovascular: Normal rate Pulmonary/chest: Effort normal Neurologic: Patient is alert Skin: Skin is warm Psychiatric: Patient has normal mood and affect    Ortho Exam: Range of motion of the right knee.  Left knee has 8 degree flexion contracture.  Trace effusion present.  Intact.  Medial greater than lateral joint line tenderness is noted.  Tell femoral crepitus is present without apprehension.  Extensor mechanism is nontender and intact  Specialty Comments:  No specialty comments available.  Imaging: No results found.   PMFS History: Patient Active Problem List   Diagnosis Date Noted  . Dizziness   . Carotid artery disease (HCC)   .  Hx of CABG   . CAD (coronary artery disease)   . Hyperlipidemia   . Aortic valve sclerosis   . GERD (gastroesophageal reflux disease)   . Muscle ache   . Ejection fraction   . KNEE PAIN, LEFT, CHRONIC 05/21/2010  . PVC (premature ventricular contraction) 06/03/2009  . Carotid bruit 06/03/2009  . Type II diabetes mellitus with peripheral circulatory disorder, uncontrolled (HCC) 03/27/2008  . Essential hypertension 03/27/2008   Past Medical History:  Diagnosis Date  . Aortic valve sclerosis     echo june 2009  . CAD (coronary artery disease)    a. 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 b. s/p CABG 04/2011.  . Carotid artery disease (HCC)    Doppler, preop, September, 2012,  mild bilateral plaque with no significant stenoses  . Diabetes mellitus   . Dizziness    June, 2013  . Ejection fraction    a. Ejection fraction 60%, echo, June, 2009. b. EF preserved by cath 2012.  [name]. Food poisoning    age 66  . GERD (gastroesophageal reflux disease)   . Hx of CABG    [name],  x4  May 01, 2011 limited to the LAD, left radial artery to the OM, SVG to diagonal, SVG to posterior descending  . Hyperlipidemia   . Hypertension   . Muscle ache    on Crestor  09/04/09 CPK 193(7-232) tolerated simvastatin in the past  . PVC (premature ventricular contraction) 06/2009   November, 2010  . Rash     Family History  Problem Relation Age of Onset  . Heart attack Paternal Grandfather        late 960s  . Hearing loss Paternal Grandfather   . Hypertension Father   . Heart attack Father 65       bypass  . Diabetes Father   . Hearing loss Father   . Thyroid disease Father   . Cancer Maternal Grandfather        site unknown  . Prostate cancer Maternal Grandfather   . Diabetes Paternal Aunt        X 3  . Heart failure Maternal Grandmother   . Diabetes Mother   . Diabetes Paternal Uncle   . Stroke Neg Hx     Past Surgical History:  Procedure Laterality Date  . BACK SURGERY    . coccyx bone removed    . CORONARY ARTERY BYPASS GRAFT  05/01/2011   CABG x4 with LIMA to LAD, LRA to OM, SVG to D1, SVG to PDA, EVH via left thigh  . Intraoperative transesophageal echocardiography.  05/01/2011  . pilonidal cystectomy     Social History   Occupational History  . Occupation: Emergency planning/management officerolice officer   Tobacco Use  . Smoking status: Never Smoker  . Smokeless tobacco: Never Used  Substance and Sexual Activity  . Alcohol use: No  . Drug use: No  . Sexual  activity: Not on file

## 2017-07-10 DIAGNOSIS — G8929 Other chronic pain: Secondary | ICD-10-CM | POA: Diagnosis not present

## 2017-07-10 DIAGNOSIS — M25562 Pain in left knee: Secondary | ICD-10-CM | POA: Diagnosis not present

## 2017-07-10 MED ORDER — METHYLPREDNISOLONE ACETATE 40 MG/ML IJ SUSP
40.0000 mg | INTRAMUSCULAR | Status: AC | PRN
  Administered 2017-07-10: 40 mg via INTRA_ARTICULAR

## 2017-07-10 MED ORDER — BUPIVACAINE HCL 0.25 % IJ SOLN
4.0000 mL | INTRAMUSCULAR | Status: AC | PRN
  Administered 2017-07-10: 4 mL via INTRA_ARTICULAR

## 2017-07-10 MED ORDER — LIDOCAINE HCL 1 % IJ SOLN
5.0000 mL | INTRAMUSCULAR | Status: AC | PRN
Start: 2017-07-10 — End: 2017-07-10
  Administered 2017-07-10: 5 mL

## 2017-07-29 DIAGNOSIS — H2513 Age-related nuclear cataract, bilateral: Secondary | ICD-10-CM | POA: Diagnosis not present

## 2017-07-29 DIAGNOSIS — H52223 Regular astigmatism, bilateral: Secondary | ICD-10-CM | POA: Diagnosis not present

## 2017-07-29 DIAGNOSIS — E119 Type 2 diabetes mellitus without complications: Secondary | ICD-10-CM | POA: Diagnosis not present

## 2017-07-29 DIAGNOSIS — H524 Presbyopia: Secondary | ICD-10-CM | POA: Diagnosis not present

## 2017-07-29 DIAGNOSIS — H5213 Myopia, bilateral: Secondary | ICD-10-CM | POA: Diagnosis not present

## 2017-08-04 ENCOUNTER — Ambulatory Visit: Payer: Commercial Managed Care - PPO | Admitting: Endocrinology

## 2017-08-10 ENCOUNTER — Other Ambulatory Visit: Payer: Self-pay

## 2017-08-10 MED ORDER — METFORMIN HCL ER 500 MG PO TB24: 2000.0000 mg | ORAL_TABLET | Freq: Every day | ORAL | 1 refills | Status: DC

## 2017-08-23 ENCOUNTER — Encounter: Payer: Self-pay | Admitting: Endocrinology

## 2017-08-23 ENCOUNTER — Ambulatory Visit: Payer: Medicare HMO | Admitting: Endocrinology

## 2017-08-23 ENCOUNTER — Other Ambulatory Visit: Payer: Self-pay

## 2017-08-23 ENCOUNTER — Telehealth: Payer: Self-pay | Admitting: Endocrinology

## 2017-08-23 VITALS — BP 128/76 | HR 60 | Ht 68.0 in | Wt 215.0 lb

## 2017-08-23 DIAGNOSIS — R69 Illness, unspecified: Secondary | ICD-10-CM | POA: Diagnosis not present

## 2017-08-23 DIAGNOSIS — E782 Mixed hyperlipidemia: Secondary | ICD-10-CM

## 2017-08-23 DIAGNOSIS — I10 Essential (primary) hypertension: Secondary | ICD-10-CM | POA: Diagnosis not present

## 2017-08-23 DIAGNOSIS — Z794 Long term (current) use of insulin: Secondary | ICD-10-CM

## 2017-08-23 DIAGNOSIS — E1165 Type 2 diabetes mellitus with hyperglycemia: Secondary | ICD-10-CM

## 2017-08-23 MED ORDER — GLUCOSE BLOOD VI STRP: ORAL_STRIP | 6 refills | Status: AC

## 2017-08-23 NOTE — Telephone Encounter (Signed)
[facility] ph# [phone] needs clarification on script for the strips (they did not receive script for lancets or monitor). Please call them and advise.

## 2017-08-23 NOTE — Progress Notes (Signed)
Patient ID: [name] [name], male   DOB: 1950/08/15, 67 y.o.   MRN: [id number]           Reason for Appointment: Follow-up for Type 2 Diabetes   History of Present Illness:          Date of diagnosis of type 2 diabetes mellitus: 2011        Background history:  He was probably started on metformin at diagnosis and subsequently given Amaryl also Subsequently had been on Janumet and more recently this was changed to [facility]. Because of poor control he was given Levemir in addition to his Kombiglyze in 01/2013 The dose was progressively increased from initial dose of 12 units He did have somewhat better control right after starting insulin with A1c below 7% but only one time His blood sugars had been consistently high since 2014 with A1c at least 8%  Recent history:   INSULIN regimen is described as: Toujeo 60 bid,Humalog at suppertime 10  units  Non-insulin hypoglycemic drugs the patient is taking are: Invokana 364m 1/2 daily ,Metformin ER 2 g, Victoza 1.8 mg daily   His A1c is  7.1 in November done by PCP   Current blood sugar patterns, management and problems identified:  He has not been checking his blood sugars regularly especially with problems with getting his test strips covered by insurance  Also at least for 4 days he was not able to get insulin because of insurance issues  MORNING blood sugars recently are increased more than usual although he has not checked before breakfast consistently recently   Has only one high reading after supper but is checking postprandial readings very sporadically  He is still taking Toujeo, previously on [place] take 60 units twice a day despite instructions to take 124 units in the evening once a day  His weight is about the same  His insurance is not going to cover contour and he will have to switch to switch to One Touch  Side effects from medications have been: ?  Dizziness from Invokana  Hypoglycemia: None    Glucose  monitoring:  done less than once  in a day         Glucometer:  contour Blood Glucose readings from download:   Mean values apply above for all meters except median for One Touch  PRE-MEAL Fasting Lunch Dinner Bedtime Overall  Glucose range: 149-197  104-160   117, 171    Mean/median: 171    156    Self-care: The diet that the patient has been following is: tries to limit carbs.      Meal times: Breakfast: 9 AM Lunch: 1 PM Dinner: 6 PM   Typical meal intake:Lunch is a sandwich or vegetables, variable meal at suppertime.  For snacks will have popcorn               Dietician visit, most recent: 6/16               Exercise: some walking, Previously was doing this 4-5/7 days     Weight history: Highest  previously 256  Wt Readings from Last 3 Encounters:  08/23/17 215 lb (97.5 kg)  07/01/17 216 lb (98 kg)  04/28/17 216 lb (98 kg)    Glycemic control:   Lab Results  Component Value Date   HGBA1C 6.6 04/28/2017   HGBA1C 7.0 10/27/2016   HGBA1C 6.6 07/08/2016   Lab Results  Component Value Date   MICROALBUR 0.4 01/27/2017  [place] 64 04/25/2014   CREATININE 0.61 04/28/2017    Other active problems: See review of systems    Allergies as of 08/23/2017      Reactions   Rosuvastatin Other (See Comments)   REACTION: myalgias      Medication List        Accurate as of 08/23/17 10:30 AM. Always use your most recent med list.          aspirin 81 MG tablet Take 81 mg by mouth daily.   atorvastatin 20 MG tablet Commonly known as:  LIPITOR Take 1 tablet (20 mg total) by mouth daily.   CONTOUR NEXT EZ MONITOR w/Device Kit TEST BLOOD SUGAR ONCE DAILY   glucose blood test strip Commonly known as:  BAYER CONTOUR NEXT TEST TEST BLOOD SUGAR ONCE DAILY   Icosapent Ethyl 1 g Caps Commonly known as:  VASCEPA Take 2 capsules by mouth 2 (two) times daily.   insulin lispro 100 UNIT/ML KiwkPen Commonly known as:  HUMALOG KWIKPEN Inject 12-14 units before supper.     INVOKANA 300 MG Tabs tablet Generic drug:  canagliflozin Take 150 mg by mouth every other day.   metFORMIN 500 MG 24 hr tablet Commonly known as:  GLUCOPHAGE-XR Take 4 tablets (2,000 mg total) by mouth daily with supper.   MICROLET LANCETS Misc TEST BLOOD SUGAR ONCE DAILY   NOVOFINE 32G X 6 MM Misc Generic drug:  Insulin Pen Needle USE TO INJECT INSULIN 2 TIMES PER DAY.   ramipril 2.5 MG capsule Commonly known as:  ALTACE TAKE 1 CAPSULE (2.5 MG TOTAL) BY MOUTH DAILY.   TOUJEO MAX SOLOSTAR 300 UNIT/ML Sopn Generic drug:  Insulin Glargine INJECT 60 UNITS INTO THE SKIN 2 (TWO) TIMES DAILY.   VICTOZA 18 MG/3ML Sopn Generic drug:  liraglutide INJECT 1.8 MG ONCE DAILY AT THE SAME TIME       Allergies:  Allergies  Allergen Reactions  . Rosuvastatin Other (See Comments)    REACTION: myalgias    Past Medical History:  Diagnosis Date  . Aortic valve sclerosis    echo june 2009  . CAD (coronary artery disease)    a. 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 b. s/p CABG 04/2011.  . Carotid artery disease ([place])    Doppler, preop, September, 2012,  mild bilateral plaque with no significant stenoses  . Diabetes mellitus   . Dizziness    June, 2013  . Ejection fraction    a. Ejection fraction 60%, echo, June, 2009. b. EF preserved by cath 2012.  [name] Food poisoning    age 52  . GERD (gastroesophageal reflux disease)   . Hx of CABG    Roxy Manns,  x4  May 01, 2011 limited to the LAD, left radial artery to the OM, SVG to diagonal, SVG to posterior descending  . Hyperlipidemia   . Hypertension   . Muscle ache    on Crestor  09/04/09 CPK 193(7-232) tolerated simvastatin in the past  . PVC (premature ventricular contraction) 06/2009   November, 2010  . Rash     Past Surgical History:  Procedure Laterality Date  . BACK SURGERY    . coccyx bone removed    . CORONARY ARTERY BYPASS GRAFT  05/01/2011   CABG x4 with LIMA to LAD, LRA to OM,  SVG to D1, SVG to PDA, EVH via left thigh  . Intraoperative transesophageal echocardiography.  05/01/2011  . pilonidal cystectomy      Family  History  Problem Relation Age of Onset  . Heart attack Paternal Grandfather        late 50s  . Hearing loss Paternal Grandfather   . Hypertension Father   . Heart attack Father 21       bypass  . Diabetes Father   . Hearing loss Father   . Thyroid disease Father   . Cancer Maternal Grandfather        site unknown  . Prostate cancer Maternal Grandfather   . Diabetes Paternal Aunt        X 3  . Heart failure Maternal Grandmother   . Diabetes Mother   . Diabetes Paternal Uncle   . Stroke Neg Hx     Social History:  reports that  has never smoked. he has never used smokeless tobacco. He reports that he does not drink alcohol or use drugs.    Review of Systems   Lipid history: He has been On Vascepa for significantly high triglycerides along with his Lipitor  Triglycerides have been still high recently, was 280 with PCP Now his insurance will not cover Vascepa or Lovaza Also her LDL was 103, no changes made by his PCP on his Lipitor 20 mg dose    Lab Results  Component Value Date   CHOL 114 01/27/2017   HDL 29.80 (L) 01/27/2017   LDLCALC 64 04/25/2014   LDLDIRECT 53.0 01/27/2017   TRIG 225.0 (H) 01/27/2017   CHOLHDL 4 01/27/2017            THYROID: He has a 2.4 cm dominant left-sided thyroid nodule on Ultrasound in 12/2014  Follow-up showed the same size of the nodules  TSH consistently normal, was 1.9 done in November  HYPERTENSION:  on  2.5 mg ramipril qod  He does not take it daily because of feeling lightheaded and weak when taking this at the same time as Invokana   BP Readings from Last 3 Encounters:  08/23/17 128/76  07/01/17 (!) 148/88  04/28/17 140/88       LABS:  No visits with results within 1 Week(s) from this visit.  Latest known visit with results is:  Office Visit on 04/28/2017  Component Date  Value Ref Range Status  . Hemoglobin A1C 04/28/2017 6.6   Final  . Sodium 04/28/2017 138  135 - 145 mEq/L Final  . Potassium 04/28/2017 4.2  3.5 - 5.1 mEq/L Final  . Chloride 04/28/2017 105  96 - 112 mEq/L Final  . CO2 04/28/2017 27  19 - 32 mEq/L Final  . Glucose, Bld 04/28/2017 172* 70 - 99 mg/dL Final  . BUN 04/28/2017 11  6 - 23 mg/dL Final  . Creatinine, Ser 04/28/2017 0.61  0.40 - 1.50 mg/dL Final  . Total Bilirubin 04/28/2017 0.8  0.2 - 1.2 mg/dL Final  . Alkaline Phosphatase 04/28/2017 33* 39 - 117 U/L Final  . AST 04/28/2017 16  0 - 37 U/L Final  . ALT 04/28/2017 22  0 - 53 U/L Final  . Total Protein 04/28/2017 7.0  6.0 - 8.3 g/dL Final  . Albumin 04/28/2017 4.5  3.5 - 5.2 g/dL Final  . Calcium 04/28/2017 9.4  8.4 - 10.5 mg/dL Final  . GFR 04/28/2017 141.08  >60.00 mL/min Final  . TSH 04/28/2017 2.11  0.35 - 4.50 uIU/mL Final    Physical Examination:  BP 128/76   Pulse 60   Ht 5' 8"  (1.727 m)   Wt 215 lb (97.5 kg)   SpO2 96%  BMI 32.69 kg/m      ASSESSMENT:  Diabetes type 2 on insulin    See history of present illness for detailed discussion of  current management, blood sugar patterns and problems identified  He has insulin deficiency and insulin resistance requiring very large doses of basal insulin He has a relatively high A1c 7.1  Not clear what his blood sugar patterns are as he is checking infrequently and does not usually checked readings on waking up Most likely still has relatively high fasting readings and sporadically after meals also He has had difficulty with insurance issues but is able to be consistent with his regimen now except glucose monitoring   HYPERTENSION: Blood pressure is well controlled with ramipril every other day Also benefiting from [place] to be rechecked on his next visit since he has not taken his Vascepa This is not covered by insurance and neither is Lovaza   PLAN:   He can check his fasting readings again  and if they are more than 140 consistently will go up by 6 units for his evening Toujeo  Given new One Touch Verio flex meter  He will check his blood sugars at various times and discussed need to check more readings to help adjust both his insulin doses  Increase walking  Consistent diet  For now continue Lipitor and take fish oil, 4 capsules daily OTC, may consider switching to Crestor because of low HDL  Also consider switching Victoza to Ozempic   Patient Instructions  If am sugar stays > 140 then up pm Toujeo insulin to 66 units  Check blood sugars on waking up  4/7   Also check blood sugars about 2 hours after a meal and do this after different meals by rotation  Recommended blood sugar levels on waking up is 90-130 and about 2 hours after meal is 130-170  Please bring your blood sugar monitor to each visit, thank you      Elayne Snare 08/23/2017, 10:30 AM   Note: This office note was prepared with Dragon voice recognition system technology. Any transcriptional errors that result from this process are unintentional.  Counseling time on subjects discussed in assessment and plan sections is over 50% of today's 25 minute visit

## 2017-08-23 NOTE — Telephone Encounter (Signed)
Spoke with Morrie SheldonAshley and had her re-run his test strips under onetouch and it went through this matter has been resolved

## 2017-08-23 NOTE — Patient Instructions (Addendum)
If am sugar stays > 140 then up pm Toujeo insulin to 66 units  Check blood sugars on waking up  4/7   Also check blood sugars about 2 hours after a meal and do this after different meals by rotation  Recommended blood sugar levels on waking up is 90-130 and about 2 hours after meal is 130-170  Please bring your blood sugar monitor to each visit, thank you  OTC fish oil 2 caps am and pm

## 2017-08-30 ENCOUNTER — Encounter (INDEPENDENT_AMBULATORY_CARE_PROVIDER_SITE_OTHER): Payer: Self-pay | Admitting: Orthopedic Surgery

## 2017-08-30 ENCOUNTER — Ambulatory Visit (INDEPENDENT_AMBULATORY_CARE_PROVIDER_SITE_OTHER): Payer: Medicare HMO | Admitting: Orthopedic Surgery

## 2017-08-30 DIAGNOSIS — M1712 Unilateral primary osteoarthritis, left knee: Secondary | ICD-10-CM

## 2017-09-01 ENCOUNTER — Encounter (INDEPENDENT_AMBULATORY_CARE_PROVIDER_SITE_OTHER): Payer: Self-pay | Admitting: Orthopedic Surgery

## 2017-09-01 NOTE — Progress Notes (Signed)
Office Visit Note   Patient: [name] [name]           Date of Birth: 04-24-1951           MRN: [id number] Visit Date: 08/30/2017 Requested by: [name] Corn, MD [address] [place], [place] [address] PCP: [name] Corn, MD  Subjective: Chief Complaint  Patient presents with  . 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  He has a diagnosis of end stage left knee arthritis.  Left knee was injected 07/05/2017 with very good relief.  Slowly the pain has returned.  He currently denies any rest pain.  He takes Tylenol for pain.  Going up and down steps are difficult but flat ground walking is tolerable.              ROS: All systems reviewed are negative as they relate to the chief complaint within the history of present illness.  Patient denies  fevers or chills.   Assessment & Plan: Visit Diagnoses:  1. Unilateral primary osteoarthritis, left knee     Plan: Impression is left knee pain with arthritis.  Plan is to have gel injection preapproved.  He wants to try to delay knee replacement as long as possible.  Currently he is symptomatic but is functional.  We will try the gel injection and could get him on a every 3 month injection schedule.  I will see him back in 1-2 weeks after Visco supplementation has been approved for the arthritic left knee.  Follow-Up Instructions: No Follow-up on file.   Orders:  No orders of the defined types were placed in this encounter.  No orders of the defined types were placed in this encounter.     Procedures: No procedures performed   Clinical Data: No additional findings.  Objective: Vital Signs: There were no vitals taken for this visit.  Physical Exam:   Constitutional: Patient appears well-developed HEENT:  Head: Normocephalic Eyes:EOM are normal Neck: Normal range of motion Cardiovascular: Normal rate Pulmonary/chest: Effort normal Neurologic: Patient is alert Skin: Skin is  warm Psychiatric: Patient has normal mood and affect    Ortho Exam: Slightly antalgic gait to the left.  Patient has no effusion in the left knee.  Collateral and cruciate ligaments are stable.  Pedal pulses palpable.  No groin pain with internal/external rotation of the left leg.  No other masses lymphadenopathy or skin changes noted in the left knee region.  Medial joint line tenderness is present.  Specialty Comments:  No specialty comments available.  Imaging: No results found.   PMFS History: Patient Active Problem List   Diagnosis Date Noted  . Dizziness   . Carotid artery disease (HCC)   . Hx of CABG   . CAD (coronary artery disease)   . Hyperlipidemia   . Aortic valve sclerosis   . GERD (gastroesophageal reflux disease)   . Muscle ache   . Ejection fraction   . KNEE PAIN, LEFT, CHRONIC 05/21/2010  . PVC (premature ventricular contraction) 06/03/2009  . Carotid bruit 06/03/2009  . Type II diabetes mellitus with peripheral circulatory disorder, uncontrolled (HCC) 03/27/2008  . Essential hypertension 03/27/2008   Past Medical History:  Diagnosis Date  . Aortic valve sclerosis    echo june 2009  . CAD (coronary artery disease)    a. 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 b. s/p CABG 04/2011.  . Carotid artery disease (  HCC)    Doppler, preop, September, 2012,  mild bilateral plaque with no significant stenoses  . Diabetes mellitus   . Dizziness    June, 2013  . Ejection fraction    a. Ejection fraction 60%, echo, June, 2009. b. EF preserved by cath 2012.  [name]. Food poisoning    age 67  . GERD (gastroesophageal reflux disease)   . Hx of CABG    [name],  x4  May 01, 2011 limited to the LAD, left radial artery to the OM, SVG to diagonal, SVG to posterior descending  . Hyperlipidemia   . Hypertension   . Muscle ache    on Crestor  09/04/09 CPK 193(7-232) tolerated simvastatin in the past  . PVC (premature ventricular  contraction) 06/2009   November, 2010  . Rash     Family History  Problem Relation Age of Onset  . Heart attack Paternal Grandfather        late 3560s  . Hearing loss Paternal Grandfather   . Hypertension Father   . Heart attack Father 65       bypass  . Diabetes Father   . Hearing loss Father   . Thyroid disease Father   . Cancer Maternal Grandfather        site unknown  . Prostate cancer Maternal Grandfather   . Diabetes Paternal Aunt        X 3  . Heart failure Maternal Grandmother   . Diabetes Mother   . Diabetes Paternal Uncle   . Stroke Neg Hx     Past Surgical History:  Procedure Laterality Date  . BACK SURGERY    . coccyx bone removed    . CORONARY ARTERY BYPASS GRAFT  05/01/2011   CABG x4 with LIMA to LAD, LRA to OM, SVG to D1, SVG to PDA, EVH via left thigh  . Intraoperative transesophageal echocardiography.  05/01/2011  . pilonidal cystectomy     Social History   Occupational History  . Occupation: Emergency planning/management officerolice officer   Tobacco Use  . Smoking status: Never Smoker  . Smokeless tobacco: Never Used  Substance and Sexual Activity  . Alcohol use: No  . Drug use: No  . Sexual activity: Not on file

## 2017-09-09 ENCOUNTER — Telehealth (INDEPENDENT_AMBULATORY_CARE_PROVIDER_SITE_OTHER): Payer: Self-pay

## 2017-09-09 NOTE — Telephone Encounter (Signed)
IC s/w patient and advised that insurance authorized monovisc. appt scheduled for him to see Dr August Saucerean for this.

## 2017-09-10 ENCOUNTER — Other Ambulatory Visit: Payer: Self-pay

## 2017-09-10 DIAGNOSIS — R69 Illness, unspecified: Secondary | ICD-10-CM | POA: Diagnosis not present

## 2017-09-10 MED ORDER — ONETOUCH LANCETS MISC: 0 refills | Status: DC

## 2017-09-10 MED ORDER — GLUCOSE BLOOD VI STRP: ORAL_STRIP | 0 refills | Status: DC

## 2017-09-10 MED ORDER — ONETOUCH VERIO W/DEVICE KIT: 1.0000 | PACK | Freq: Once | 0 refills | Status: AC

## 2017-09-12 ENCOUNTER — Other Ambulatory Visit: Payer: Self-pay | Admitting: Endocrinology

## 2017-09-15 ENCOUNTER — Telehealth: Payer: Self-pay | Admitting: Endocrinology

## 2017-09-15 ENCOUNTER — Other Ambulatory Visit: Payer: Self-pay

## 2017-09-15 MED ORDER — INSULIN GLARGINE 300 UNIT/ML ~~LOC~~ SOPN: 60.0000 [IU] | PEN_INJECTOR | Freq: Two times a day (BID) | SUBCUTANEOUS | 1 refills | Status: DC

## 2017-09-15 NOTE — Telephone Encounter (Signed)
Please send to pharmacy   TOUJEO MAX SOLOSTAR 300 UNIT/ML SOPN   90 day supply  [id number] [place] [place] - [address] [facility] [address] [facility] [address]

## 2017-09-15 NOTE — Telephone Encounter (Signed)
This prescription has been sent to the pharmacy.

## 2017-09-16 ENCOUNTER — Other Ambulatory Visit: Payer: Self-pay

## 2017-09-16 MED ORDER — INSULIN GLARGINE 300 UNIT/ML ~~LOC~~ SOPN: 60.0000 [IU] | PEN_INJECTOR | Freq: Two times a day (BID) | SUBCUTANEOUS | 1 refills | Status: DC

## 2017-09-20 ENCOUNTER — Encounter (INDEPENDENT_AMBULATORY_CARE_PROVIDER_SITE_OTHER): Payer: Self-pay | Admitting: Orthopedic Surgery

## 2017-09-20 ENCOUNTER — Ambulatory Visit (INDEPENDENT_AMBULATORY_CARE_PROVIDER_SITE_OTHER): Payer: Medicare HMO | Admitting: Orthopedic Surgery

## 2017-09-20 DIAGNOSIS — M1712 Unilateral primary osteoarthritis, left knee: Secondary | ICD-10-CM | POA: Diagnosis not present

## 2017-09-20 MED ORDER — HYALURONAN 88 MG/4ML IX SOSY
88.0000 mg | PREFILLED_SYRINGE | INTRA_ARTICULAR | Status: AC | PRN
  Administered 2017-09-20: 88 mg via INTRA_ARTICULAR

## 2017-09-20 MED ORDER — LIDOCAINE HCL 1 % IJ SOLN
5.0000 mL | INTRAMUSCULAR | Status: AC | PRN
  Administered 2017-09-20: 5 mL

## 2017-09-20 MED ORDER — METHYLPREDNISOLONE ACETATE 40 MG/ML IJ SUSP
40.0000 mg | INTRAMUSCULAR | Status: AC | PRN
  Administered 2017-09-20: 40 mg via INTRA_ARTICULAR

## 2017-09-20 NOTE — Progress Notes (Signed)
   Procedure Note  Patient: [name] [name]             Date of Birth: July 20, 1951           MRN: [id number]             Visit Date: 09/20/2017  Procedures: Visit Diagnoses: Unilateral primary osteoarthritis, left knee  Large Joint Inj: L knee on 09/20/2017 10:40 AM Indications: diagnostic evaluation, joint swelling and pain Details: 18 G 1.5 in needle, superolateral approach  Arthrogram: No  Medications: 5 mL lidocaine 1 %; 40 mg methylPREDNISolone acetate 40 MG/ML; 88 mg Hyaluronan 88 MG/4ML Aspirate: 10 mL yellow Outcome: tolerated well, no immediate complications Procedure, treatment alternatives, risks and benefits explained, specific risks discussed. Consent was given by the patient. Immediately prior to procedure a time out was called to verify the correct patient, procedure, equipment, support staff and site/side marked as required. Patient was prepped and draped in the usual sterile fashion.

## 2017-10-14 ENCOUNTER — Telehealth: Payer: Self-pay | Admitting: Endocrinology

## 2017-10-14 NOTE — Telephone Encounter (Signed)
Patient's insurance does not cover Humalog Insulin  pen or Tujeo Max.  Insurance Does Cover: Levemir, Social research officer, governmentTresiba Flex Touch, Vasaglar Quick Pen, Novalog, AND Novolin Please send RX's for the above covered medications that would be best for patient to [facility] [address] [place] Needs medicine today if possible

## 2017-10-14 NOTE — Telephone Encounter (Signed)
Please advise 

## 2017-10-14 NOTE — Telephone Encounter (Signed)
He will use the same doses of Tresiba U-200 as the Toujeo and also the same doses of NovoLog as the Humalog, please send prescription

## 2017-10-15 ENCOUNTER — Telehealth: Payer: Self-pay | Admitting: Endocrinology

## 2017-10-15 MED ORDER — INSULIN DEGLUDEC 200 UNIT/ML ~~LOC~~ SOPN: 120.0000 [IU] | PEN_INJECTOR | Freq: Every day | SUBCUTANEOUS | 2 refills | Status: DC

## 2017-10-15 MED ORDER — INSULIN ASPART 100 UNIT/ML FLEXPEN: 12.0000 [IU] | PEN_INJECTOR | Freq: Every day | SUBCUTANEOUS | 3 refills | Status: DC

## 2017-10-15 NOTE — Telephone Encounter (Signed)
Patient is calling on the status of last message sent. He will be out medication this weekend  Patient's insurance does not cover Humalog Insulin  pen or Tujeo Max.  Insurance Does Cover: Levemir, Social research officer, governmentTresiba Flex Touch, Vasaglar Quick Pen, Novalog, AND Novolin Please send RX's for the above covered medications that would be best for patient to [facility] [address] [place] Needs medicine today if possible

## 2017-10-15 NOTE — Telephone Encounter (Signed)
See 10/14/17 telephone note.

## 2017-10-15 NOTE — Telephone Encounter (Signed)
Misty StanleyStacey will you please call these meds in as soon as you can the pt will be out by the end of today thank you

## 2017-10-15 NOTE — Telephone Encounter (Signed)
New Rxs sent. See meds. Pt informed.  

## 2017-10-15 NOTE — Addendum Note (Signed)
Addended by: Merrilyn PumaSIMMONS, Jamale Spangler N on: 10/15/2017 04:57 PM   Modules accepted: Orders

## 2017-10-17 ENCOUNTER — Encounter: Payer: Self-pay | Admitting: Gastroenterology

## 2017-11-21 NOTE — Progress Notes (Signed)
Patient ID: [name] [name], male   DOB: 15-Apr-1951, 67 y.o.   MRN: [id number]           Reason for Appointment: Follow-up for Type 2 Diabetes   History of Present Illness:          Date of diagnosis of type 2 diabetes mellitus: 2011        Background history:  He was probably started on metformin at diagnosis and subsequently given Amaryl also Subsequently had been on Janumet and more recently this was changed to [facility]. Because of poor control he was given Levemir in addition to his Kombiglyze in 01/2013 The dose was progressively increased from initial dose of 12 units He did have somewhat better control right after starting insulin with A1c below 7% but only one time His blood sugars had been consistently high since 2014 with A1c at least 8%  Recent history:   INSULIN regimen is described as: Evaristo Bury 120 units daily Novolog at suppertime 10-12  units  Non-insulin hypoglycemic drugs the patient is taking are: Invokana 300mg  1/2 daily ,Metformin ER 2 g, Victoza 1.8 mg daily   His A1c is 6.8% and previously was 7.1 in November done by PCP   Current blood sugar patterns, management and problems identified:  He thinks his blood sugar may have been higher especially the last month or so because of not getting his supplies for the Tresiba insulin and having issues with the pharmacy/insurance  For some time he was taking only half the dose of [name] which was started after Toujeo was stopped  However he has checked his blood sugars only 4 days in the last month  FASTING readings are mostly high and only once down to 130; this is from a large dinner last night  Despite reminders he does not do any readings after meals and only has one reading in the early afternoon which was 121  He thinks he is starting to walk more recently, limited by joint pain  Overall with trying to do fairly well with that he has maintained his weight but not lost any  Does not check readings after  breakfast and only taking NOVOLOG at suppertime  Side effects from medications have been: ?  Dizziness from Invokana  Hypoglycemia: None    Glucose monitoring:  done less than once  in a day         Glucometer: One Touch Verio   Blood Glucose readings from download:   FASTING range 130-202, median 178    Self-care: The diet that the patient has been following is: tries to limit carbs.      Meal times: Breakfast: 9 AM Lunch: 1 PM Dinner: 6 PM   Typical meal intake:Lunch is a sandwich or vegetables, variable meal at suppertime.  For snacks will have popcorn               Dietician visit, most recent: 6/16               Exercise:  walking,  4-5/7 days     Weight history: Highest  previously 256  Wt Readings from Last 3 Encounters:  11/22/17 215 lb 9.6 oz (97.8 kg)  08/23/17 215 lb (97.5 kg)  07/01/17 216 lb (98 kg)    Glycemic control:   Lab Results  Component Value Date   HGBA1C 6.8 11/22/2017   HGBA1C 6.6 04/28/2017   HGBA1C 7.0 10/27/2016   Lab Results  Component Value Date   MICROALBUR <0.7 11/22/2017  LDLCALC 64 04/25/2014   CREATININE 0.58 11/22/2017    Other active problems: See review of systems    Allergies as of 11/22/2017      Reactions   Rosuvastatin Other (See Comments)   REACTION: myalgias      Medication List        Accurate as of 11/22/17  8:37 PM. Always use your most recent med list.          aspirin 81 MG tablet Take 81 mg by mouth daily.   atorvastatin 20 MG tablet Commonly known as:  LIPITOR Take 1 tablet (20 mg total) by mouth daily.   glucose blood test strip USE TO TEST BLOOD SUGAR TWICE DAILY DX CODE E11.65   glucose blood test strip Commonly known as:  ONETOUCH VERIO Use as instructed to test sugars once daily   insulin aspart 100 UNIT/ML FlexPen Commonly known as:  NOVOLOG FLEXPEN Inject 12-14 Units into the skin daily before supper.   Insulin Degludec 200 UNIT/ML Sopn Commonly known as:  TRESIBA FLEXTOUCH Inject  120 Units into the skin daily.   INVOKANA 300 MG Tabs tablet Generic drug:  canagliflozin Take 150 mg by mouth every other day.   metFORMIN 500 MG 24 hr tablet Commonly known as:  GLUCOPHAGE-XR Take 4 tablets (2,000 mg total) by mouth daily with supper.   NOVOFINE 32G X 6 MM Misc Generic drug:  Insulin Pen Needle USE TO INJECT INSULIN 2 TIMES PER DAY.   ONE TOUCH LANCETS Misc Use to check sugars once daily   ramipril 2.5 MG capsule Commonly known as:  ALTACE TAKE 1 CAPSULE (2.5 MG TOTAL) BY MOUTH DAILY.   VICTOZA 18 MG/3ML Sopn Generic drug:  liraglutide INJECT 1.8 MG ONCE DAILY AT THE SAME TIME       Allergies:  Allergies  Allergen Reactions  . Rosuvastatin Other (See Comments)    REACTION: myalgias    Past Medical History:  Diagnosis Date  . Aortic valve sclerosis    echo june 2009  . CAD (coronary artery disease)    a. 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 b. s/p CABG 04/2011.  . Carotid artery disease (HCC)    Doppler, preop, September, 2012,  mild bilateral plaque with no significant stenoses  . Diabetes mellitus   . Dizziness    June, 2013  . Ejection fraction    a. Ejection fraction 60%, echo, June, 2009. b. EF preserved by cath 2012.  [name] Food poisoning    age 60  . GERD (gastroesophageal reflux disease)   . Hx of CABG    [name],  x4  May 01, 2011 limited to the LAD, left radial artery to the OM, SVG to diagonal, SVG to posterior descending  . Hyperlipidemia   . Hypertension   . Muscle ache    on Crestor  09/04/09 CPK 193(7-232) tolerated simvastatin in the past  . PVC (premature ventricular contraction) 06/2009   November, 2010  . Rash     Past Surgical History:  Procedure Laterality Date  . BACK SURGERY    . coccyx bone removed    . CORONARY ARTERY BYPASS GRAFT  05/01/2011   CABG x4 with LIMA to LAD, LRA to OM, SVG to D1, SVG to PDA, EVH via left thigh  . Intraoperative transesophageal  echocardiography.  05/01/2011  . pilonidal cystectomy      Family History  Problem Relation Age of Onset  . Heart attack Paternal Grandfather  late 4060s  . Hearing loss Paternal Grandfather   . Hypertension Father   . Heart attack Father 65       bypass  . Diabetes Father   . Hearing loss Father   . Thyroid disease Father   . Cancer Maternal Grandfather        site unknown  . Prostate cancer Maternal Grandfather   . Diabetes Paternal Aunt        X 3  . Heart failure Maternal Grandmother   . Diabetes Mother   . Diabetes Paternal Uncle   . Stroke Neg Hx     Social History:  reports that he has never smoked. He has never used smokeless tobacco. He reports that he does not drink alcohol or use drugs.    Review of Systems   Lipid history: He has been taking fish oil for significantly high triglycerides along with his Lipitor  Triglycerides have been consistently high; his insurance will not cover Vascepa or Lovaza, however is only taking 1 fish all capsules daily now    Lab Results  Component Value Date   CHOL 130 11/22/2017   HDL 29.10 (L) 11/22/2017   LDLCALC 64 04/25/2014   LDLDIRECT 42.0 11/22/2017   TRIG (H) 11/22/2017    581.0 Triglyceride is over 400; calculations on Lipids are invalid.   CHOLHDL 4 11/22/2017            THYROID: He has a 2.4 cm dominant left-sided thyroid nodule on Ultrasound in 12/2014  Follow-up showed the same size of the nodules  TSH previously normal, was 1.9 done in November  HYPERTENSION:  on  2.5 mg ramipril qod Periodically checking at home   BP Readings from Last 3 Encounters:  11/22/17 138/84  08/23/17 128/76  07/01/17 (!) 148/88       LABS:  Office Visit on 11/22/2017  Component Date Value Ref Range Status  . Hemoglobin A1C 11/22/2017 6.8   Final  . Microalb, Ur 11/22/2017 <0.7  0.0 - 1.9 mg/dL Final  . Creatinine,U 16/10/960404/22/2019 51.0  mg/dL Final  . Microalb Creat Ratio 11/22/2017 1.4  0.0 - 30.0 mg/g Final  .  Sodium 11/22/2017 138  135 - 145 mEq/L Final  . Potassium 11/22/2017 4.0  3.5 - 5.1 mEq/L Final  . Chloride 11/22/2017 105  96 - 112 mEq/L Final  . CO2 11/22/2017 23  19 - 32 mEq/L Final  . Glucose, Bld 11/22/2017 144* 70 - 99 mg/dL Final  . BUN 54/09/811904/22/2019 11  6 - 23 mg/dL Final  . Creatinine, Ser 11/22/2017 0.58  0.40 - 1.50 mg/dL Final  . Total Bilirubin 11/22/2017 0.6  0.2 - 1.2 mg/dL Final  . Alkaline Phosphatase 11/22/2017 37* 39 - 117 U/L Final  . AST 11/22/2017 15  0 - 37 U/L Final  . ALT 11/22/2017 23  0 - 53 U/L Final  . Total Protein 11/22/2017 6.8  6.0 - 8.3 g/dL Final  . Albumin 14/78/295604/22/2019 4.3  3.5 - 5.2 g/dL Final  . Calcium 21/30/865704/22/2019 9.2  8.4 - 10.5 mg/dL Final  . GFR 84/69/629504/22/2019 149.27  >60.00 mL/min Final  . Cholesterol 11/22/2017 130  0 - 200 mg/dL Final   ATP III Classification       Desirable:  < 200 mg/dL               Borderline High:  200 - 239 mg/dL          High:  > = 284240 mg/dL  .  Triglycerides 11/22/2017 581.0 Triglyceride is over 400; calculations on Lipids are invalid.* 0.0 - 149.0 mg/dL Final   Normal:  <161 mg/dLBorderline High:  150 - 199 mg/dL  . HDL 11/22/2017 29.10* >39.00 mg/dL Final  . Total CHOL/HDL Ratio 11/22/2017 4   Final                  Men          Women1/2 Average Risk     3.4          3.3Average Risk          5.0          4.42X Average Risk          9.6          7.13X Average Risk          15.0          11.0                      . Direct LDL 11/22/2017 42.0  mg/dL Final   Optimal:  <096 mg/dLNear or Above Optimal:  100-129 mg/dLBorderline High:  130-159 mg/dLHigh:  160-189 mg/dLVery High:  >190 mg/dL    Physical Examination:  BP 138/84 (BP Location: Left Arm, Patient Position: Sitting, Cuff Size: Normal)   Pulse 84   Ht 5\' 8"  (1.727 m)   Wt 215 lb 9.6 oz (97.8 kg)   SpO2 98%   BMI 32.78 kg/m      ASSESSMENT:  Diabetes type 2 on insulin    See history of present illness for detailed discussion of  current management, blood sugar  patterns and problems identified  He has insulin deficiency and insulin resistance requiring large doses of basal insulin He has a somewhat better A1c of 6.8 today However despite not taking his Evaristo Bury consistently over the last month or so because of insurance and pharmacy issues his blood sugars are not consistently high at least fasting With restarting [name] 2 weeks ago his blood sugar came down to 130 last Friday  Weight is about the same, trying to do more walking lately Not clear what his blood sugar patterns are as he is checking infrequently and no readings after meals Discussed that we cannot adjust his NovoLog unless he has postprandial readings to look at   HYPERTENSION: Blood pressure is well controlled with ramipril every other day     LIPIDS to be rechecked especially triglycerides which have been previously consistently high Currently on inadequate treatment and only taking OTC fish oil, 1 capsule daily  PLAN:   He we will continue the same doses of insulin  If his morning sugars start being consistently high he can go up another 6 units on Tresiba  Otherwise try to focus on readings after meals to help adjust his NovoLog at suppertime  Needs to check blood sugars more consistently   Patient Instructions  Check blood sugars on waking up  3/7  Also check blood sugars about 2 hours after a meal and do this after different meals by rotation  Recommended blood sugar levels on waking up is 90-130 and about 2 hours after meal is 130-160  Please bring your blood sugar monitor to each visit, thank you        Reather Littler 11/22/2017, 8:37 PM   Note: This office note was prepared with Dragon voice recognition system technology. Any transcriptional errors that result from this process are unintentional.  Addendum: Triglycerides  581, needs to take 2 capsules twice daily of fish oil and start fenofibrate 160 mg daily

## 2017-11-22 ENCOUNTER — Encounter: Payer: Self-pay | Admitting: Endocrinology

## 2017-11-22 ENCOUNTER — Encounter (INDEPENDENT_AMBULATORY_CARE_PROVIDER_SITE_OTHER): Payer: Self-pay

## 2017-11-22 ENCOUNTER — Ambulatory Visit (INDEPENDENT_AMBULATORY_CARE_PROVIDER_SITE_OTHER): Payer: Medicare HMO | Admitting: Endocrinology

## 2017-11-22 VITALS — BP 138/84 | HR 84 | Ht 68.0 in | Wt 215.6 lb

## 2017-11-22 DIAGNOSIS — E782 Mixed hyperlipidemia: Secondary | ICD-10-CM | POA: Diagnosis not present

## 2017-11-22 DIAGNOSIS — Z794 Long term (current) use of insulin: Secondary | ICD-10-CM

## 2017-11-22 DIAGNOSIS — E1165 Type 2 diabetes mellitus with hyperglycemia: Secondary | ICD-10-CM | POA: Diagnosis not present

## 2017-11-22 LAB — LIPID PANEL
Cholesterol: 130 mg/dL (ref 0–200)
HDL: 29.1 mg/dL — ABNORMAL LOW (ref >39.00)
Total CHOL/HDL Ratio: 4
Triglycerides: 581 mg/dL — ABNORMAL HIGH (ref 0.0–149.0)

## 2017-11-22 LAB — MICROALBUMIN / CREATININE URINE RATIO
Creatinine,U: 51 mg/dL
MICROALB/CREAT RATIO: 1.4 mg/g (ref 0.0–30.0)
Microalb, Ur: <0.7 mg/dL (ref 0.0–1.9)

## 2017-11-22 LAB — COMPREHENSIVE METABOLIC PANEL
ALK PHOS: 37 U/L — AB (ref 39–117)
ALT: 23 U/L (ref 0–53)
AST: 15 U/L (ref 0–37)
Albumin: 4.3 g/dL (ref 3.5–5.2)
BILIRUBIN TOTAL: 0.6 mg/dL (ref 0.2–1.2)
BUN: 11 mg/dL (ref 6–23)
CO2: 23 mEq/L (ref 19–32)
CREATININE: 0.58 mg/dL (ref 0.40–1.50)
Calcium: 9.2 mg/dL (ref 8.4–10.5)
Chloride: 105 mEq/L (ref 96–112)
GFR: 149.27 mL/min (ref >60.00)
GLUCOSE: 144 mg/dL — AB (ref 70–99)
Potassium: 4 mEq/L (ref 3.5–5.1)
SODIUM: 138 meq/L (ref 135–145)
TOTAL PROTEIN: 6.8 g/dL (ref 6.0–8.3)

## 2017-11-22 LAB — LDL CHOLESTEROL, DIRECT: Direct LDL: 42 mg/dL

## 2017-11-22 LAB — POCT GLYCOSYLATED HEMOGLOBIN (HGB A1C): Hemoglobin A1C: 6.8

## 2017-11-22 MED ORDER — FENOFIBRATE MICRONIZED 200 MG PO CAPS: 200.0000 mg | ORAL_CAPSULE | Freq: Every day | ORAL | 3 refills | Status: DC

## 2017-11-22 NOTE — Patient Instructions (Addendum)
Check blood sugars on waking up 3/7   Also check blood sugars about 2 hours after a meal and do this after different meals by rotation  Recommended blood sugar levels on waking up is 90-130 and about 2 hours after meal is 130-160  Please bring your blood sugar monitor to each visit, thank you  

## 2018-01-05 ENCOUNTER — Other Ambulatory Visit: Payer: Self-pay | Admitting: Endocrinology

## 2018-01-24 ENCOUNTER — Ambulatory Visit: Payer: Medicare HMO | Admitting: Cardiology

## 2018-01-24 ENCOUNTER — Encounter: Payer: Self-pay | Admitting: Cardiology

## 2018-01-24 VITALS — BP 146/80 | HR 71 | Ht 68.0 in | Wt 221.8 lb

## 2018-01-24 DIAGNOSIS — I739 Peripheral vascular disease, unspecified: Secondary | ICD-10-CM

## 2018-01-24 DIAGNOSIS — E1151 Type 2 diabetes mellitus with diabetic peripheral angiopathy without gangrene: Secondary | ICD-10-CM

## 2018-01-24 DIAGNOSIS — E1165 Type 2 diabetes mellitus with hyperglycemia: Secondary | ICD-10-CM | POA: Diagnosis not present

## 2018-01-24 DIAGNOSIS — IMO0002 Reserved for concepts with insufficient information to code with codable children: Secondary | ICD-10-CM

## 2018-01-24 DIAGNOSIS — I2510 Atherosclerotic heart disease of native coronary artery without angina pectoris: Secondary | ICD-10-CM | POA: Diagnosis not present

## 2018-01-24 DIAGNOSIS — E782 Mixed hyperlipidemia: Secondary | ICD-10-CM

## 2018-01-24 DIAGNOSIS — I2583 Coronary atherosclerosis due to lipid rich plaque: Secondary | ICD-10-CM | POA: Diagnosis not present

## 2018-01-24 DIAGNOSIS — I779 Disorder of arteries and arterioles, unspecified: Secondary | ICD-10-CM | POA: Diagnosis not present

## 2018-01-24 NOTE — Patient Instructions (Signed)

## 2018-01-24 NOTE — Progress Notes (Signed)
Cardiology Office Note   Date:  01/24/2018   ID:  [name] [name], DOB 1951-06-13, MRN [id number]  PCP:  [name] Cornusso, John, MD  Cardiologist:  [name] SchultzMark Zamani Crocker, MD  Endocrine: [name] [name]      History of Present Illness: [name] [name] is a 67 y.o. male former patient of [name] [name] who presents today to follow-up CAD/CABG artery disease and hyperlipidemia, former PCI to RCA  prior to his bypass surgery.    He tells me that he previously had nighttime leg cramps at first. This improved over time. He is stable now. His follow-up labs reveal an LDL of 56. TSH was normal. Unfortunately his triglycerides are high. I'm sure that goes along with his diabetes. He has been seeing [name] [name], endocrinology.  This is improved since improving his diabetes.  Triglycerides 544 on 04/15/15, hemoglobin A1c 7.5.  Triglycerides are  280 on 06/15/17.  Invokana.  Never had symptoms prior to CABG. [name] [name] did ETT. Abnormal. First RCA stent in 2004 then CABG 2012.   I saw [name] [name] as well who has passed. His [name] [name] has had carotid endarterectomy.  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 [place].  12/02/16-overall doing quite well, no chest pain, no shortness of breath, no syncope, no bleeding. Only problems, hard to work and take meds for DM. Dissatisfied personally with it. Feeling aches and pain. One time when pulling his fast boat in he feels like he strained his chest wall. He forgets that he has had bypass surgery. This dissipated after 2 or 3 days. He denies any syncope, chest pain, shortness of breath. Taking his medications well.  07/01/17- he had an experience when he was walking uphill for about 1-1/2 miles after [place] [employer] [place] where he had to slow down because of shortness of breath.  Usually he is quite active and has no trouble before or since then.  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  He has been grieving the loss of his [name] [name] who was a patient of mine.  01/24/18 -overall been doing quite well.  Once again relayed to me that story at the [employer] football game when he walked up hill and felt short of breath and had to slow down.  He is not having any anginal symptoms otherwise.  Taking his medications.  Sometimes has low blood pressure with his diabetic medication and he takes his ramipril every other day because of this.  No syncope bleeding orthopnea PND.  Hemoglobin A1c 6.8, triglycerides 280, LDL 42, ALT 43  Past Medical History:  Diagnosis Date  . Aortic valve sclerosis    echo june 2009  . CAD (coronary artery disease)    a. 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 b. s/p CABG 04/2011.  . Carotid artery disease (HCC)    Doppler, preop, September, 2012,  mild bilateral plaque with no significant stenoses  . Diabetes mellitus   . Dizziness    June, 2013  . Ejection fraction    a. Ejection fraction 60%, echo, June, 2009. b. EF preserved by cath 2012.  [name]. Food poisoning    age 67  . GERD (gastroesophageal reflux disease)   . Hx of CABG    [name] [name],  x4  May 01, 2011 limited to the LAD, left radial artery to the OM, SVG to diagonal, SVG  to posterior descending  . Hyperlipidemia   . Hypertension   . Muscle ache    on Crestor  09/04/09 CPK 193(7-232) tolerated simvastatin in the past  . PVC (premature ventricular contraction) 06/2009   November, 2010  . Rash     Past Surgical History:  Procedure Laterality Date  . BACK SURGERY    . coccyx bone removed    . CORONARY ARTERY BYPASS GRAFT  05/01/2011   CABG x4 with LIMA to LAD, LRA to OM, SVG to D1, SVG to PDA, EVH via left thigh  . Intraoperative transesophageal echocardiography.  05/01/2011  . pilonidal cystectomy      Patient Active Problem List   Diagnosis Date Noted  . Dizziness   . Carotid artery disease (HCC)   . Hx of CABG   . CAD (coronary  artery disease)   . Hyperlipidemia   . Aortic valve sclerosis   . GERD (gastroesophageal reflux disease)   . Muscle ache   . Ejection fraction   . KNEE PAIN, LEFT, CHRONIC 05/21/2010  . PVC (premature ventricular contraction) 06/03/2009  . Carotid bruit 06/03/2009  . Type II diabetes mellitus with peripheral circulatory disorder, uncontrolled (HCC) 03/27/2008  . Essential hypertension 03/27/2008      Current Outpatient Medications  Medication Sig Dispense Refill  . aspirin 81 MG tablet Take 81 mg by mouth daily.      [name] atorvastatin (LIPITOR) 20 MG tablet Take 1 tablet (20 mg total) by mouth daily. 90 tablet 2  . canagliflozin (INVOKANA) 300 MG TABS tablet Take 150 mg by mouth every other day.    . fenofibrate micronized (LOFIBRA) 200 MG capsule Take 1 capsule (200 mg total) by mouth daily before breakfast. 30 capsule 3  . glucose blood (ONETOUCH VERIO) test strip Use as instructed to test sugars once daily 100 each 0  . glucose blood test strip USE TO TEST BLOOD SUGAR TWICE DAILY DX CODE E11.65 100 each 6  . Insulin Degludec (TRESIBA FLEXTOUCH) 200 UNIT/ML SOPN Inject 120 Units into the skin daily. 9 pen 2  . metFORMIN (GLUCOPHAGE-XR) 500 MG 24 hr tablet Take 4 tablets (2,000 mg total) by mouth daily with supper. 360 tablet 1  . NOVOFINE 32G X 6 MM MISC USE TO INJECT INSULIN 2 TIMES PER DAY. 100 each 1  . NOVOLOG FLEXPEN 100 UNIT/ML FlexPen INJECT 12-14 UNITS INTO THE SKIN DAILY BEFORE SUPPER. 2 pen 2  . ONE TOUCH LANCETS MISC Use to check sugars once daily 100 each 0  . ramipril (ALTACE) 2.5 MG capsule TAKE 1 CAPSULE (2.5 MG TOTAL) BY MOUTH DAILY. 90 capsule 1  . VICTOZA 18 MG/3ML SOPN INJECT 1.8 MG ONCE DAILY AT THE SAME TIME (Patient taking differently: INJECT 1.8 MG ONCE DAILY AT THE SAME TIME DAILY.) 3 pen 3   No current facility-administered medications for this visit.     Allergies:   Rosuvastatin    Social History:  The patient  reports that he has never smoked. He has  never used smokeless tobacco. He reports that he does not drink alcohol or use drugs.   Family History:  The patient's family history includes Cancer in his maternal grandfather; Diabetes in his [name] [name], mother, paternal aunt, and paternal uncle; Hearing loss in his [name] [name] and paternal grandfather; Heart attack in his paternal grandfather; Heart attack (age of onset: 45) in his [name] [name]; Heart failure in his maternal grandmother; Hypertension in his [name] [name]; Prostate cancer in his maternal grandfather; Thyroid disease in his [name] [name].  ROS:  Please see the history of present illness.    All other ROS  PHYSICAL EXAM: VS:  BP (!) 146/80   Pulse 71   Ht 5\' 8"  (1.727 m)   Wt 221 lb 12.8 oz (100.6 kg)   BMI 33.72 kg/m  , GEN: Well nourished, well developed, in no acute distress, overweight HEENT: normal  Neck: no JVD, carotid bruits, or masses Cardiac: RRR; no murmurs, rubs, or gallops,no edema  Respiratory:  clear to auscultation bilaterally, normal work of breathing GI: soft, nontender, nondistended, + BS MS: no deformity or atrophy  Skin: warm and dry, no rash Neuro:  Alert and Oriented x 3, Strength and sensation are intact Psych: euthymic mood, full affect    EKG:   EKG ordered today 01/24/18 - NSR NSSTW changes. Personally viewed. 12/02/16-sinus rhythm 77 with nonspecific T-wave changes, subtle T-wave inversion in V6, QT interval 412 ms. Personally viewed. 06/12/15-sinus rhythm, 68, nonspecific ST-T wave changes, no other abnormalities. Poor R wave progression.  Echocardiogram: 01/24/2008  - Normal ejection fraction  - Mild aortic regurgitation  - Mild mitral regurgitation  Carotid ultrasound 04/28/2011  - Mild bilateral plaque  Recent Labs: 04/28/2017: TSH 2.11 11/22/2017: ALT 23; BUN 11; Creatinine, Ser 0.58; Potassium 4.0; Sodium 138    Lipid Panel    Component Value Date/Time   CHOL 130 11/22/2017 1015   TRIG (H) 11/22/2017 1015    581.0 Triglyceride is over 400; calculations  on Lipids are invalid.   HDL 29.10 (L) 11/22/2017 1015   CHOLHDL 4 11/22/2017 1015   VLDL 45.0 (H) 01/27/2017 1145   LDLCALC 64 04/25/2014 0957   LDLDIRECT 42.0 11/22/2017 1015      Wt Readings from Last 3 Encounters:  01/24/18 221 lb 12.8 oz (100.6 kg)  11/22/17 215 lb 9.6 oz (97.8 kg)  08/23/17 215 lb (97.5 kg)      Current medicines are reviewed  The patient understands his medications. Reviewed prior office notes, labs, ECG     ASSESSMENT AND PLAN:  Coronary artery disease/status post CABG  -  Overall doing quite well.  He did have an episode when he was walking uphill at [place] where he felt some shortness of breath which is unusual for him.  He told me about this again.  No chest pain at that time.  He has not felt anything since.  I told him that if he begins to feel this way again to let us know and we will have low threshold for stress test.  Overall continue with aggressive secondary prevention.  Hyperlipidemia  - Dr. Myrtis Ser change statin, 20 mg of atorvastatin.  Continues to take this.  Overall doing quite well.  No myalgias.  Dr. Lucianne Muss has given him Vascepa in the past but I do not see this on his current medication list, could not afford.  His triglycerides have improved. His triglycerides were 505 on 10/27/16. Now on fenofibrate. Direct LDL was 48 however. Hemoglobin A1c 7.0. He understands.  Diabetes with peripheral vascular disease/carotid artery plaque  - Dr. Lucianne Muss has been working with him. Invokana - BP low at times.   -  Mild plaque.  Statin  Carotid artery plaque  - Mild plaque noted on ultrasound. Continue with aggressive secondary prevention. He was concerned because his [name] [name] had a severe carotid artery stenosis that required carotid endarterectomy in his late 4s.  - No changes. Statin. DM control  Obesity  - weight loss discussed.  Follow up 1 year.  Loraine Leriche  Marlou Porch, MD

## 2018-02-19 ENCOUNTER — Other Ambulatory Visit: Payer: Self-pay | Admitting: Endocrinology

## 2018-02-21 ENCOUNTER — Ambulatory Visit: Payer: Medicare HMO | Admitting: Endocrinology

## 2018-02-24 ENCOUNTER — Other Ambulatory Visit: Payer: Self-pay | Admitting: Endocrinology

## 2018-02-28 ENCOUNTER — Other Ambulatory Visit: Payer: Self-pay | Admitting: Endocrinology

## 2018-03-02 ENCOUNTER — Other Ambulatory Visit: Payer: Self-pay

## 2018-03-02 ENCOUNTER — Other Ambulatory Visit: Payer: Self-pay | Admitting: Endocrinology

## 2018-03-02 MED ORDER — CANAGLIFLOZIN 300 MG PO TABS: 150.0000 mg | ORAL_TABLET | ORAL | 3 refills | Status: DC

## 2018-03-07 ENCOUNTER — Other Ambulatory Visit: Payer: Self-pay | Admitting: Endocrinology

## 2018-03-10 IMAGING — US US SOFT TISSUE HEAD/NECK
1 series · 14 of 25 positions shown · non-contrast
Comparison: 12/26/2014

CLINICAL DATA: Thyroid nodule

EXAM:
THYROID ULTRASOUND
TECHNIQUE: Ultrasound examination of the thyroid gland and adjacent soft
tissues was performed.

[Series 1: us soft tissue head/neck · 0.08mm/px · 14 of 63 slices shown]
[im 1/63]
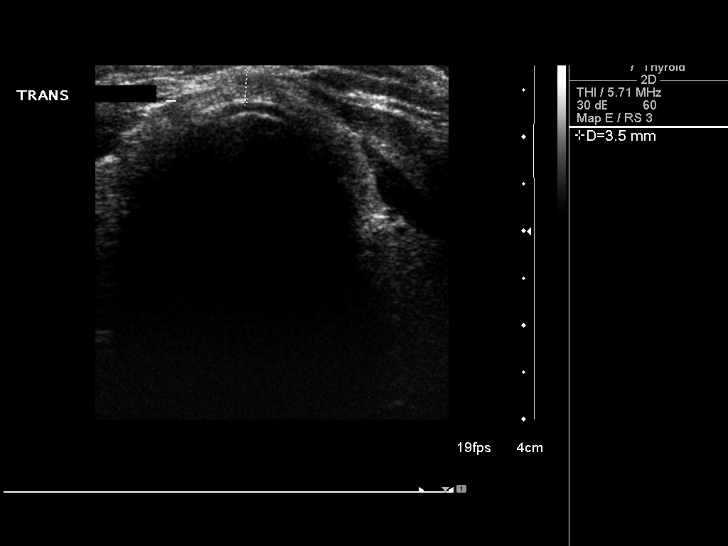
[im 6/63]
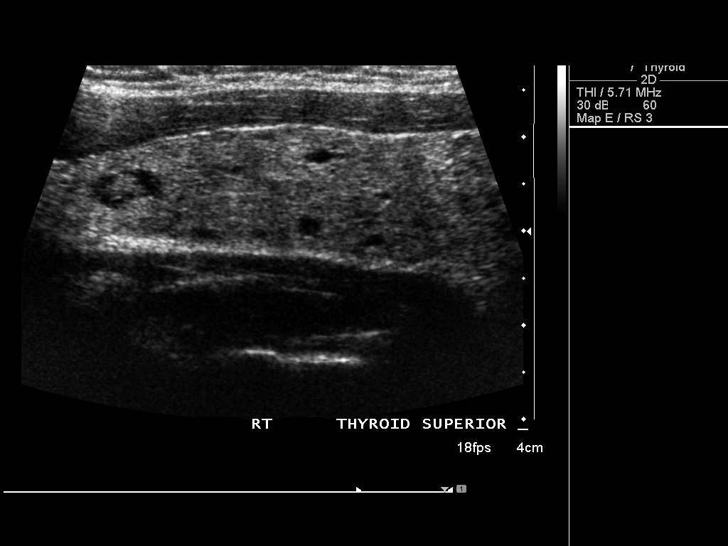
[im 11/63]
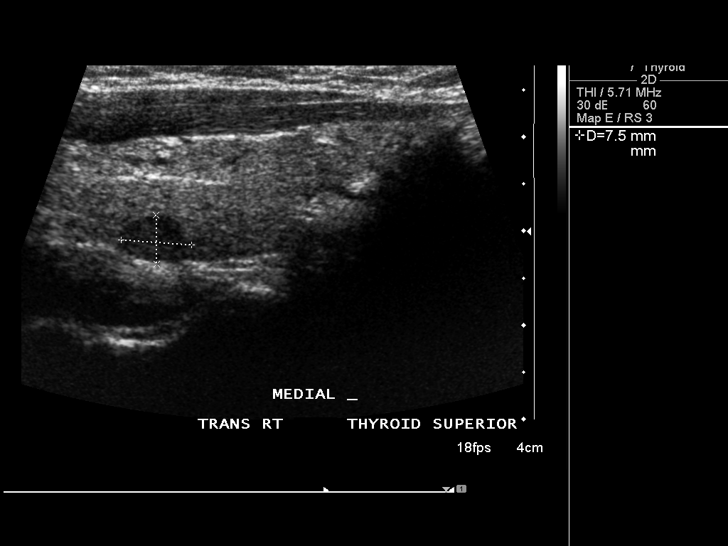
[im 16/63]
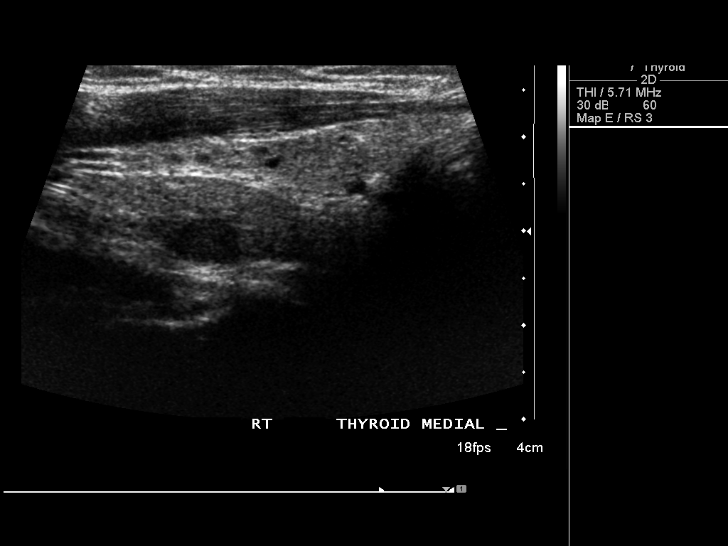
[im 21/63]
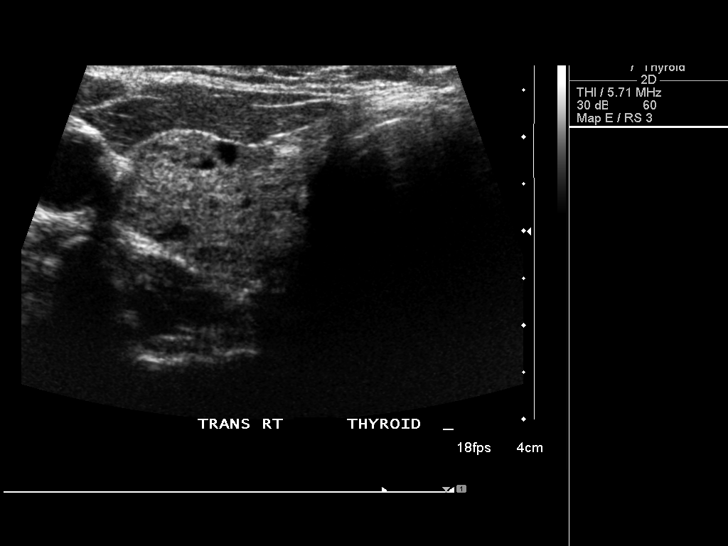
[im 24/63]
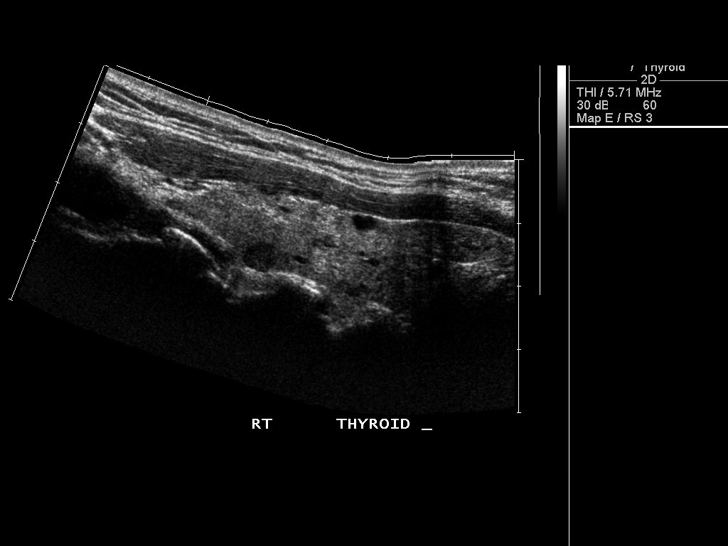
[im 29/63]
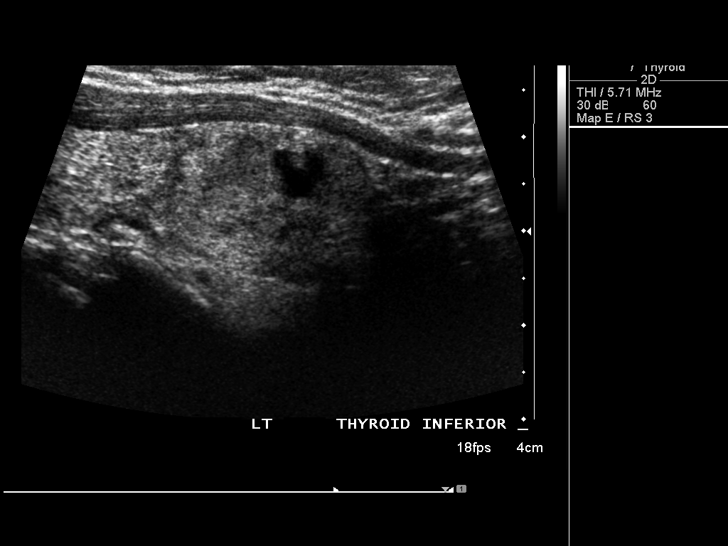
[im 34/63]
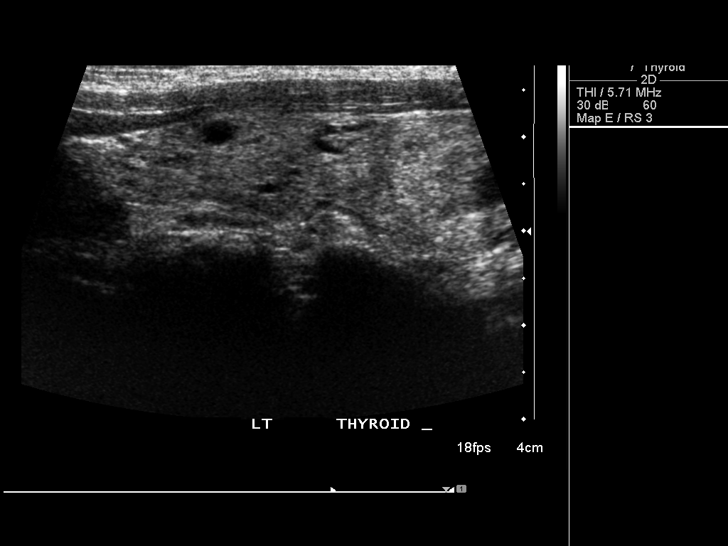
[im 39/63]
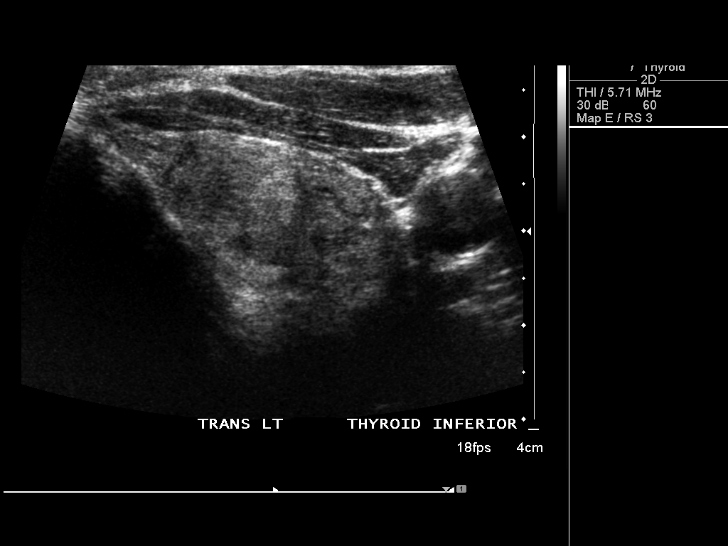
[im 42/63]
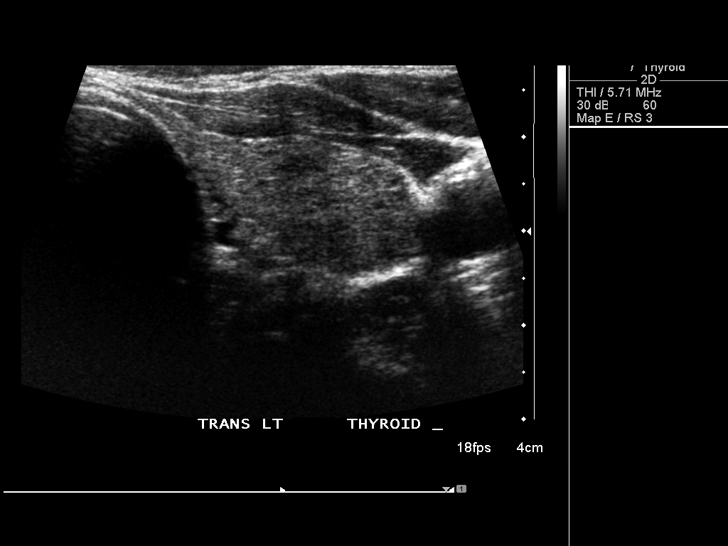
[im 47/63]
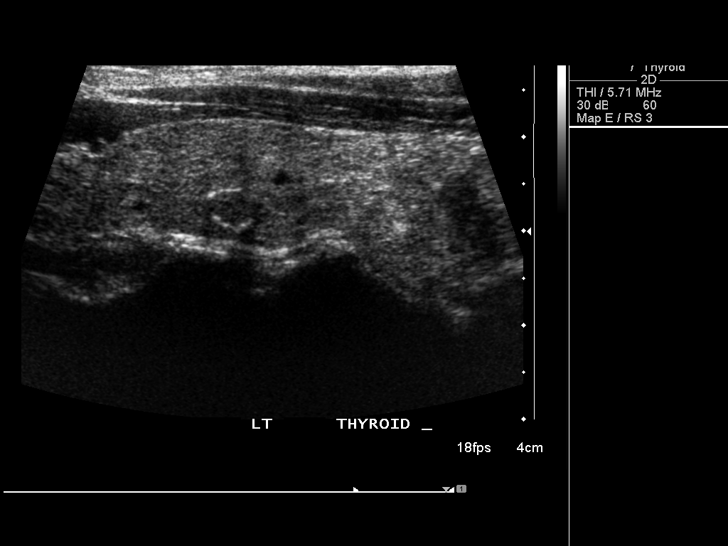
[im 52/63]
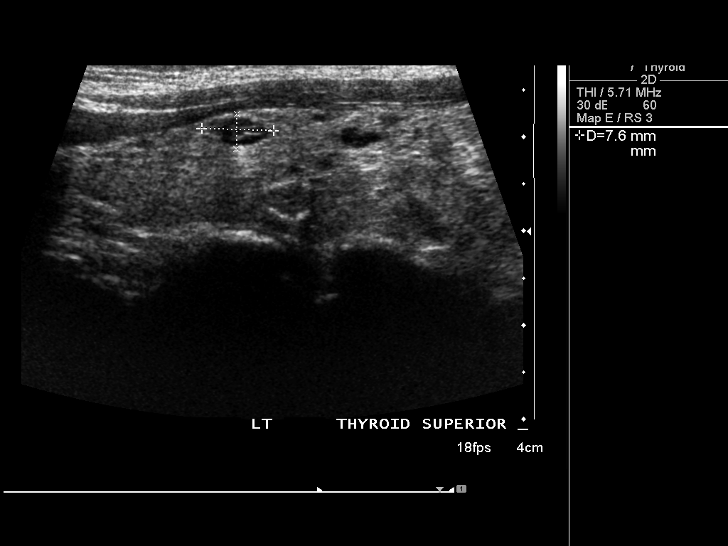
[im 57/63]
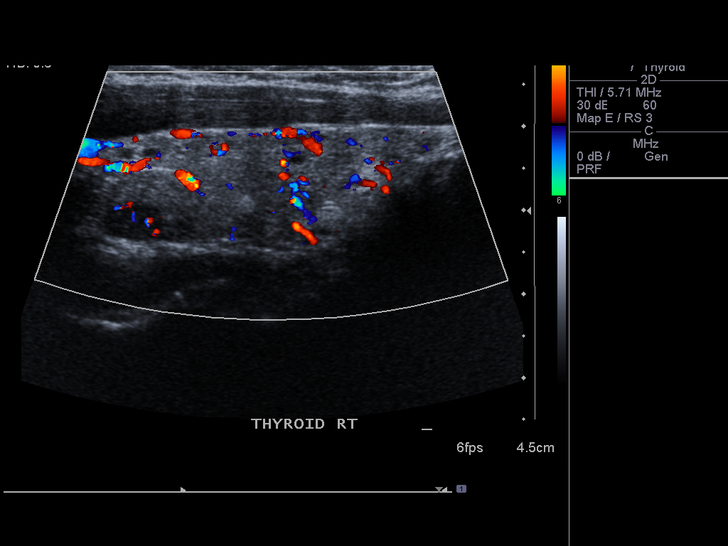
[im 63/63]
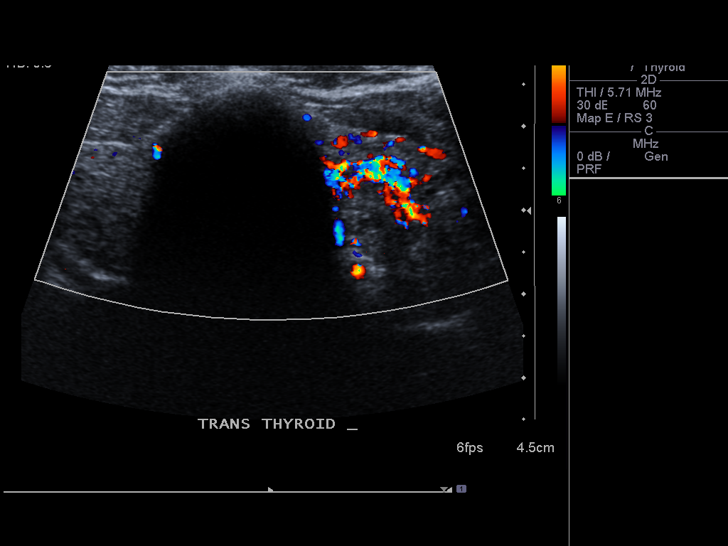

[14 of 25 positions shown; findings below may reference images not displayed]

FINDINGS: Right thyroid lobe

Measurements: 6.1 x 1.6 x 2.1 cm. Multiple subcentimeter nodules are
scattered throughout the right lobe. The largest is a complex nodule
in the upper pole measuring 0.8 cm.

Left thyroid lobe

Measurements: 5.8 x 1.8 x 2.4 cm. Dominant left lower pole nodule is
heterogeneous and measures 2.4 x 1.6 x 2.2 cm. It previously
measured up to 2.4 cm. Subcentimeter mid and upper lobe nodules are
noted. A 0.7 cm mid lobe nodule is peripherally calcified. It is not
significantly changed.

Isthmus

Thickness: 0.4 cm.  No nodules visualized.

Lymphadenopathy

None visualized.
IMPRESSION: Bilateral nodules are not significantly changed. Dominant nodule is
in the left lower pole measuring 2.2 cm.

## 2018-03-23 ENCOUNTER — Other Ambulatory Visit: Payer: Self-pay | Admitting: Endocrinology

## 2018-04-12 ENCOUNTER — Ambulatory Visit (INDEPENDENT_AMBULATORY_CARE_PROVIDER_SITE_OTHER): Payer: Medicare HMO | Admitting: Endocrinology

## 2018-04-12 ENCOUNTER — Encounter: Payer: Self-pay | Admitting: Endocrinology

## 2018-04-12 VITALS — BP 150/82 | HR 86 | Ht 67.5 in | Wt 219.0 lb

## 2018-04-12 DIAGNOSIS — E782 Mixed hyperlipidemia: Secondary | ICD-10-CM

## 2018-04-12 DIAGNOSIS — I10 Essential (primary) hypertension: Secondary | ICD-10-CM

## 2018-04-12 DIAGNOSIS — E1165 Type 2 diabetes mellitus with hyperglycemia: Secondary | ICD-10-CM

## 2018-04-12 DIAGNOSIS — Z23 Encounter for immunization: Secondary | ICD-10-CM

## 2018-04-12 DIAGNOSIS — Z794 Long term (current) use of insulin: Secondary | ICD-10-CM

## 2018-04-12 LAB — COMPREHENSIVE METABOLIC PANEL
ALBUMIN: 4.5 g/dL (ref 3.5–5.2)
ALT: 24 U/L (ref 0–53)
AST: 14 U/L (ref 0–37)
Alkaline Phosphatase: 38 U/L — ABNORMAL LOW (ref 39–117)
BILIRUBIN TOTAL: 0.6 mg/dL (ref 0.2–1.2)
BUN: 16 mg/dL (ref 6–23)
CO2: 25 mEq/L (ref 19–32)
CREATININE: 0.7 mg/dL (ref 0.40–1.50)
Calcium: 9.5 mg/dL (ref 8.4–10.5)
Chloride: 104 mEq/L (ref 96–112)
GFR: 120 mL/min (ref >60.00)
Glucose, Bld: 227 mg/dL — ABNORMAL HIGH (ref 70–99)
Potassium: 4.3 mEq/L (ref 3.5–5.1)
SODIUM: 137 meq/L (ref 135–145)
TOTAL PROTEIN: 7.3 g/dL (ref 6.0–8.3)

## 2018-04-12 LAB — POCT GLYCOSYLATED HEMOGLOBIN (HGB A1C): HEMOGLOBIN A1C: 8.3 % — AB (ref 4.0–5.6)

## 2018-04-12 LAB — LIPID PANEL
Cholesterol: 112 mg/dL (ref 0–200)
HDL: 31.6 mg/dL — AB (ref >39.00)
NonHDL: 79.94
Total CHOL/HDL Ratio: 4
Triglycerides: 285 mg/dL — ABNORMAL HIGH (ref 0.0–149.0)
VLDL: 57 mg/dL — ABNORMAL HIGH (ref 0.0–40.0)

## 2018-04-12 LAB — LDL CHOLESTEROL, DIRECT: LDL DIRECT: 57 mg/dL

## 2018-04-12 MED ORDER — CANAGLIFLOZIN 300 MG PO TABS: 300.0000 mg | ORAL_TABLET | ORAL | 3 refills | Status: DC

## 2018-04-12 NOTE — Patient Instructions (Addendum)
Check blood sugars on waking up    Also check blood sugars about 2 hours after a meal and do this after different meals by rotation  Recommended blood sugar levels on waking up is 90-130 and about 2 hours after meal is 130-160  Please bring your blood sugar monitor to each visit, thank you  Take 2 fish oil covered

## 2018-04-12 NOTE — Progress Notes (Signed)
Patient ID: [name] [name], male   DOB: 26-Jun-1951, 67 y.o.   MRN: [id number]           Reason for Appointment: Follow-up for Type 2 Diabetes   History of Present Illness:          Date of diagnosis of type 2 diabetes mellitus: 2011        Background history:  He was probably started on metformin at diagnosis and subsequently given Amaryl also Subsequently had been on Janumet and more recently this was changed to [facility]. Because of poor control he was given Levemir in addition to his Kombiglyze in 01/2013 The dose was progressively increased from initial dose of 12 units He did have somewhat better control right after starting insulin with A1c below 7% but only one time His blood sugars had been consistently high since 2014 with A1c at least 8%  Recent history:   INSULIN regimen is described as: Evaristo Bury 120 units daily Novolog at suppertime 10-12  units  Non-insulin hypoglycemic drugs the patient is taking are: Invokana 300mg  1/2 daily (not taking),Metformin ER 2 g, Victoza 1.8 mg daily   His A1c is much higher than before at 8.3, previously 6.8%  Current blood sugar patterns, management and problems identified:  He had some confusion about getting his prescription for Invokana at the [facility] and has not been taking this for about 3 months  Also his blood sugars have been checked very infrequently and only a few readings in the morning  Also despite high blood sugars he is not adjusting his insulin  As before he has not checked readings after eating at night  He also thinks that he is not watching his diet even though he knows how to plan his meals from a previous visit with the dietitian  He is generally active but is not doing much formal exercise  Weight is slightly higher  Side effects from medications have been: ?  Dizziness from Invokana  Hypoglycemia: None    Glucose monitoring:  done less than once  in a day         Glucometer: One Touch Verio   Blood Glucose  readings from download:   FASTING range 154-258 with only 5 readings averaging 193    Self-care: The diet that the patient has been following is: tries to limit sweet drinks.      Meal times: Breakfast: 9 AM Lunch: 1 PM Dinner: 6 PM   Typical meal intake:Lunch is a sandwich or vegetables, variable meal at suppertime.  For snacks will have popcorn                Dietician visit, most recent: 6/16               Exercise:  walking  1/7 days     Weight history: Highest  previously 256  Wt Readings from Last 3 Encounters:  04/12/18 219 lb (99.3 kg)  01/24/18 221 lb 12.8 oz (100.6 kg)  11/22/17 215 lb 9.6 oz (97.8 kg)    Glycemic control:   Lab Results  Component Value Date   HGBA1C 8.3 (A) 04/12/2018   HGBA1C 6.8 11/22/2017   HGBA1C 6.6 04/28/2017   Lab Results  Component Value Date   MICROALBUR <0.7 11/22/2017   LDLCALC 64 04/25/2014   CREATININE 0.58 11/22/2017    Other active problems: See review of systems    Allergies as of 04/12/2018      Reactions   Rosuvastatin Other (See Comments)  REACTION: myalgias      Medication List        Accurate as of 04/12/18 10:01 AM. Always use your most recent med list.          aspirin 81 MG tablet Take 81 mg by mouth daily.   atorvastatin 20 MG tablet Commonly known as:  LIPITOR Take 1 tablet (20 mg total) by mouth daily.   canagliflozin 300 MG Tabs tablet Commonly known as:  INVOKANA Take 1 tablet (300 mg total) by mouth every other day.   fenofibrate micronized 200 MG capsule Commonly known as:  LOFIBRA TAKE 1 CAPSULE (200 MG TOTAL) BY MOUTH DAILY BEFORE BREAKFAST.   glucose blood test strip USE TO TEST BLOOD SUGAR TWICE DAILY DX CODE E11.65   glucose blood test strip Use as instructed to test sugars once daily   Insulin Degludec 200 UNIT/ML Sopn Inject 120 Units into the skin daily.   TRESIBA FLEXTOUCH 200 UNIT/ML Sopn Generic drug:  Insulin Degludec INJECT 106 UNITS INTO THE SKIN DAILY.     metFORMIN 500 MG 24 hr tablet Commonly known as:  GLUCOPHAGE-XR TAKE 4 TABLETS (2,000 MG TOTAL) BY MOUTH DAILY WITH SUPPER.   NOVOFINE 32G X 6 MM Misc Generic drug:  Insulin Pen Needle USE TO INJECT INSULIN 2 TIMES PER DAY.   NOVOLOG FLEXPEN 100 UNIT/ML FlexPen Generic drug:  insulin aspart INJECT 12-14 UNITS INTO THE SKIN DAILY BEFORE SUPPER.   ONE TOUCH LANCETS Misc Use to check sugars once daily   ramipril 2.5 MG capsule Commonly known as:  ALTACE TAKE 1 CAPSULE (2.5 MG TOTAL) BY MOUTH DAILY.   VICTOZA 18 MG/3ML Sopn Generic drug:  liraglutide INJECT 1.8 MG ONCE DAILY AT THE SAME TIME       Allergies:  Allergies  Allergen Reactions  . Rosuvastatin Other (See Comments)    REACTION: myalgias    Past Medical History:  Diagnosis Date  . Aortic valve sclerosis    echo june 2009  . CAD (coronary artery disease)    a. 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 b. s/p CABG 04/2011.  . Carotid artery disease (HCC)    Doppler, preop, September, 2012,  mild bilateral plaque with no significant stenoses  . Diabetes mellitus   . Dizziness    June, 2013  . Ejection fraction    a. Ejection fraction 60%, echo, June, 2009. b. EF preserved by cath 2012.  [name] Food poisoning    age 60  . GERD (gastroesophageal reflux disease)   . Hx of CABG    [name],  x4  May 01, 2011 limited to the LAD, left radial artery to the OM, SVG to diagonal, SVG to posterior descending  . Hyperlipidemia   . Hypertension   . Muscle ache    on Crestor  09/04/09 CPK 193(7-232) tolerated simvastatin in the past  . PVC (premature ventricular contraction) 06/2009   November, 2010  . Rash     Past Surgical History:  Procedure Laterality Date  . BACK SURGERY    . coccyx bone removed    . CORONARY ARTERY BYPASS GRAFT  05/01/2011   CABG x4 with LIMA to LAD, LRA to OM, SVG to D1, SVG to PDA, EVH via left thigh  . Intraoperative transesophageal echocardiography.   05/01/2011  . pilonidal cystectomy      Family History  Problem Relation Age of Onset  . Heart attack Paternal Grandfather  late 59s  . Hearing loss Paternal Grandfather   . Hypertension Father   . Heart attack Father 65       bypass  . Diabetes Father   . Hearing loss Father   . Thyroid disease Father   . Cancer Maternal Grandfather        site unknown  . Prostate cancer Maternal Grandfather   . Diabetes Paternal Aunt        X 3  . Heart failure Maternal Grandmother   . Diabetes Mother   . Diabetes Paternal Uncle   . Stroke Neg Hx     Social History:  reports that he has never smoked. He has never used smokeless tobacco. He reports that he does not drink alcohol or use drugs.    Review of Systems   Lipid history: He has been taking fish oil for significantly high triglycerides along with his Lipitor  Triglycerides have been consistently high; his insurance will not cover Vascepa or Lovaza Currently on fenofibrate but no fish oil as recommended    Lab Results  Component Value Date   CHOL 130 11/22/2017   HDL 29.10 (L) 11/22/2017   LDLCALC 64 04/25/2014   LDLDIRECT 42.0 11/22/2017   TRIG (H) 11/22/2017    581.0 Triglyceride is over 400; calculations on Lipids are invalid.   CHOLHDL 4 11/22/2017            THYROID: He has a 2.4 cm dominant left-sided thyroid nodule on Ultrasound in 12/2014  Follow-up showed the same size of the nodules  TSH previously normal, was 1.9 done in November  HYPERTENSION:  on  2.5 mg ramipril qod previously but now every day since he has not taken Invokana Periodically checking at home and his readings are about 140/77-87   BP Readings from Last 3 Encounters:  04/12/18 (!) 150/82  01/24/18 (!) 146/80  11/22/17 138/84       LABS:  Office Visit on 04/12/2018  Component Date Value Ref Range Status  . Hemoglobin A1C 04/12/2018 8.3* 4.0 - 5.6 % Final    Physical Examination:  BP (!) 150/82 (Patient Position: Standing)    Pulse 86   Ht 5' 7.5" (1.715 m)   Wt 219 lb (99.3 kg)   BMI 33.79 kg/m      ASSESSMENT:  Diabetes type 2 on insulin    See history of present illness for detailed discussion of  current management, blood sugar patterns and problems identified  He has insulin deficiency and insulin resistance requiring large doses of basal insulin  With not taking Invokana his control is worse than A1c 8.3 compared to 6.8 Also not doing well with diet or exercise regimen Blood sugar monitoring is infrequent Overall does not appear to be too motivated to control his diabetes   HYPERTENSION: Blood pressure is relatively higher, usually better when taking Invokana also Currently taking ramipril 2.5 mg daily    LIPIDS to be rechecked to see if fenofibrate is helping his triglyceride but likely to high because of poor diabetes control Most likely needs to be on fish oil also, currently not getting insurance coverage for prescription fish oil   PLAN:   Written prescription given for his Invokana  For now we will again try to take only half a tablet but consider increasing the dose  He we will continue the same doses of insulin but discussed need for adjustment based on his blood sugar patterns including after meals at night  He will try to  improve his diet  To check blood sugars consistently and alternate fasting and postprandial readings, discussed blood sugar targets  Check chemistry panel  Discussed need to adjust his Evaristo Bury if his morning sugars stay consistently high  Regular walking or exercise bike  Restart OTC fish oil twice a day  Follow-up in 3 months  Influenza vaccine given   Patient Instructions  Check blood sugars on waking up    Also check blood sugars about 2 hours after a meal and do this after different meals by rotation  Recommended blood sugar levels on waking up is 90-130 and about 2 hours after meal is 130-160  Please bring your blood sugar monitor to  each visit, thank you  Take 2 fish oil covered     Reather Littler 04/12/2018, 10:01 AM   Note: This office note was prepared with Dragon voice recognition system technology. Any transcriptional errors that result from this process are unintentional.  Counseling time on subjects discussed in assessment and plan sections is over 50% of today's 25 minute visit

## 2018-04-13 ENCOUNTER — Telehealth: Payer: Self-pay | Admitting: Endocrinology

## 2018-04-13 NOTE — Telephone Encounter (Signed)
canagliflozin (INVOKANA) 300 MG TABS tablet   [facility] is stating that [facility] is giving a rejection on the patient medication. It does not give her an option to request a PA.    Please advise  [phone]

## 2018-04-14 ENCOUNTER — Other Ambulatory Visit: Payer: Self-pay | Admitting: Endocrinology

## 2018-04-14 MED ORDER — DAPAGLIFLOZIN PROPANEDIOL 10 MG PO TABS: 10.0000 mg | ORAL_TABLET | Freq: Every day | ORAL | 3 refills | Status: DC

## 2018-04-14 NOTE — Telephone Encounter (Signed)
Notified pt Rx Farxiga 10 mg sent to his pharmacy with [name] instructions.

## 2018-04-14 NOTE — Telephone Encounter (Signed)
Pt is not aware of what the invokana substitute is but is asking for a quick response and decision on what he can take in replacement of this?

## 2018-04-14 NOTE — Telephone Encounter (Signed)
Please send prescription for Farxiga 10 mg, 1 tablet daily.  However please tell patient that he can start with half tablet daily until seen

## 2018-04-29 ENCOUNTER — Other Ambulatory Visit: Payer: Self-pay | Admitting: Cardiology

## 2018-05-25 ENCOUNTER — Other Ambulatory Visit: Payer: Self-pay | Admitting: Endocrinology

## 2018-06-02 ENCOUNTER — Other Ambulatory Visit: Payer: Self-pay

## 2018-06-02 MED ORDER — METFORMIN HCL ER 500 MG PO TB24: 2000.0000 mg | ORAL_TABLET | Freq: Every day | ORAL | 0 refills | Status: DC

## 2018-06-16 ENCOUNTER — Other Ambulatory Visit: Payer: Medicare HMO

## 2018-06-16 DIAGNOSIS — E7849 Other hyperlipidemia: Secondary | ICD-10-CM | POA: Diagnosis not present

## 2018-06-16 DIAGNOSIS — E119 Type 2 diabetes mellitus without complications: Secondary | ICD-10-CM | POA: Diagnosis not present

## 2018-06-16 DIAGNOSIS — I10 Essential (primary) hypertension: Secondary | ICD-10-CM | POA: Diagnosis not present

## 2018-06-16 DIAGNOSIS — R82998 Other abnormal findings in urine: Secondary | ICD-10-CM | POA: Diagnosis not present

## 2018-06-16 DIAGNOSIS — E042 Nontoxic multinodular goiter: Secondary | ICD-10-CM | POA: Diagnosis not present

## 2018-06-20 DIAGNOSIS — R69 Illness, unspecified: Secondary | ICD-10-CM | POA: Diagnosis not present

## 2018-06-23 DIAGNOSIS — I6529 Occlusion and stenosis of unspecified carotid artery: Secondary | ICD-10-CM | POA: Diagnosis not present

## 2018-06-23 DIAGNOSIS — E042 Nontoxic multinodular goiter: Secondary | ICD-10-CM | POA: Diagnosis not present

## 2018-06-23 DIAGNOSIS — E7849 Other hyperlipidemia: Secondary | ICD-10-CM | POA: Diagnosis not present

## 2018-06-23 DIAGNOSIS — Z1212 Encounter for screening for malignant neoplasm of rectum: Secondary | ICD-10-CM | POA: Diagnosis not present

## 2018-06-23 DIAGNOSIS — Z0000 Encounter for general adult medical examination without abnormal findings: Secondary | ICD-10-CM | POA: Diagnosis not present

## 2018-06-23 DIAGNOSIS — I119 Hypertensive heart disease without heart failure: Secondary | ICD-10-CM | POA: Diagnosis not present

## 2018-06-23 DIAGNOSIS — E119 Type 2 diabetes mellitus without complications: Secondary | ICD-10-CM | POA: Diagnosis not present

## 2018-06-23 DIAGNOSIS — I25810 Atherosclerosis of coronary artery bypass graft(s) without angina pectoris: Secondary | ICD-10-CM | POA: Diagnosis not present

## 2018-06-23 DIAGNOSIS — I493 Ventricular premature depolarization: Secondary | ICD-10-CM | POA: Diagnosis not present

## 2018-06-23 DIAGNOSIS — I358 Other nonrheumatic aortic valve disorders: Secondary | ICD-10-CM | POA: Diagnosis not present

## 2018-06-23 DIAGNOSIS — E668 Other obesity: Secondary | ICD-10-CM | POA: Diagnosis not present

## 2018-06-24 DIAGNOSIS — Z23 Encounter for immunization: Secondary | ICD-10-CM | POA: Diagnosis not present

## 2018-07-12 ENCOUNTER — Encounter: Payer: Self-pay | Admitting: Endocrinology

## 2018-07-12 ENCOUNTER — Ambulatory Visit: Payer: Medicare HMO | Admitting: Endocrinology

## 2018-07-12 VITALS — BP 130/78 | HR 75 | Ht 67.5 in | Wt 220.6 lb

## 2018-07-12 DIAGNOSIS — I10 Essential (primary) hypertension: Secondary | ICD-10-CM | POA: Diagnosis not present

## 2018-07-12 DIAGNOSIS — Z794 Long term (current) use of insulin: Secondary | ICD-10-CM

## 2018-07-12 DIAGNOSIS — E1165 Type 2 diabetes mellitus with hyperglycemia: Secondary | ICD-10-CM

## 2018-07-12 NOTE — Progress Notes (Signed)
Patient ID: [name] [name], male   DOB: 13-Mar-1951, 67 y.o.   MRN: [id number]           Reason for Appointment: Follow-up for Type 2 Diabetes   History of Present Illness:          Date of diagnosis of type 2 diabetes mellitus: 2011        Background history:  He was probably started on metformin at diagnosis and subsequently given Amaryl also Subsequently had been on [name] [name] and more recently this was changed to [name] [name]. Because of poor control he was given [name] [name] in addition to his [name] [name] in 01/2013 The dose was progressively increased from initial dose of 12 units He did have somewhat better control right after starting insulin with A1c below 7% but only [name] time His blood sugars had been consistently high since 2014 with A1c at least 8%  Recent history:   INSULIN regimen is described as: [name] [name] 65 bid units daily Novolog at suppertime 12  units  Non-insulin hypoglycemic drugs the patient is taking are: Farxiga 10 mg 1/2 5 days a week, Metformin ER 2 g, Victoza 1.8 mg daily   His A1c is much higher than before at 8.3 even with his PCP last month, previously 6.8%  Current blood sugar patterns, management and problems identified:  He again has confusion about his medications especially [name] [name]  Although [facility] had [facility] to say that [name] [name] was not covered by his insurance and he was changed to [name] he is not clear about what he is taking  He is taking [name] most likely only half tablet 5 days a week after showing him in the photograph of the tablet online  However even with this treatment his FASTING blood sugars are very consistently high  He has gone up 10 units a day on his [name] [name] at the suggestion of his PCP last month  For various reasons he is not walking at all, previously was doing better  Not able to lose any weight  As before he has not checked his readings after dinner or other meals at the bottom  Side effects from medications have been: ?   Dizziness from [name] [name]  Hypoglycemia: None    Glucose monitoring:  done less than once  in a day         Glucometer: [name] [name]   Blood Glucose readings from download:   FASTING range 152-245 with AVERAGE 198 Previous average 193  Self-care: The diet that the patient has been following is: tries to limit sweet drinks.      Meal times: Breakfast: 9 AM Lunch: 1 PM Dinner: 6 PM   Typical meal intake:Lunch is a sandwich or vegetables, variable meal at suppertime.  For snacks will have popcorn                Dietician visit, most recent: 6/16               Exercise:  walking  0/7 days     Weight history: Highest  previously 256  Wt Readings from Last 3 Encounters:  07/12/18 220 lb 9.6 oz (100.1 kg)  04/12/18 219 lb (99.3 kg)  01/24/18 221 lb 12.8 oz (100.6 kg)    Glycemic control:   Lab Results  Component Value Date   HGBA1C 8.3 (A) 04/12/2018   HGBA1C 6.8 11/22/2017   HGBA1C 6.6 04/28/2017   Lab Results  Component Value Date   MICROALBUR <0.7 11/22/2017   LDLCALC 64 04/25/2014  CREATININE 0.70 04/12/2018    Other active problems: See review of systems    Allergies as of 07/12/2018      Reactions   Rosuvastatin Other (See Comments)   REACTION: myalgias      Medication List        Accurate as of 07/12/18  3:07 PM. Always use your most recent med list.          aspirin 81 MG tablet Take 81 mg by mouth daily.   atorvastatin 20 MG tablet Commonly known as:  LIPITOR TAKE 1 TABLET BY MOUTH EVERY DAY   dapagliflozin propanediol 10 MG Tabs tablet Commonly known as:  FARXIGA Take 10 mg by mouth daily. Take half tablet daily   fenofibrate micronized 200 MG capsule Commonly known as:  LOFIBRA TAKE 1 CAPSULE (200 MG TOTAL) BY MOUTH DAILY BEFORE BREAKFAST.   glucose blood test strip USE TO TEST BLOOD SUGAR TWICE DAILY DX CODE E11.65   glucose blood test strip Use as instructed to test sugars once daily   Insulin Degludec 200 UNIT/ML Sopn Inject  120 Units into the skin daily.   TRESIBA FLEXTOUCH 200 UNIT/ML Sopn Generic drug:  Insulin Degludec INJECT 106 UNITS INTO THE SKIN DAILY.   metFORMIN 500 MG 24 hr tablet Commonly known as:  GLUCOPHAGE-XR Take 4 tablets (2,000 mg total) by mouth daily with supper.   NOVOFINE 32G X 6 MM Misc Generic drug:  Insulin Pen Needle USE TO INJECT INSULIN 2 TIMES PER DAY.   NOVOLOG FLEXPEN 100 UNIT/ML FlexPen Generic drug:  insulin aspart INJECT 12-14 UNITS INTO THE SKIN DAILY BEFORE SUPPER.   [name] TOUCH LANCETS Misc Use to check sugars once daily   ramipril 2.5 MG capsule Commonly known as:  ALTACE TAKE 1 CAPSULE (2.5 MG TOTAL) BY MOUTH DAILY.   VICTOZA 18 MG/3ML Sopn Generic drug:  liraglutide INJECT 1.8 MG ONCE DAILY AT THE SAME TIME       Allergies:  Allergies  Allergen Reactions  . Rosuvastatin Other (See Comments)    REACTION: myalgias    Past Medical History:  Diagnosis Date  . Aortic valve sclerosis    echo june 2009  . CAD (coronary artery disease)    a. 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 b. s/p CABG 04/2011.  . Carotid artery disease (HCC)    Doppler, preop, September, 2012,  mild bilateral plaque with no significant stenoses  . Diabetes mellitus   . Dizziness    June, 2013  . Ejection fraction    a. Ejection fraction 60%, echo, June, 2009. b. EF preserved by cath 2012.  [name] Food poisoning    age 26  . GERD (gastroesophageal reflux disease)   . Hx of CABG    [name] [name],  x4  May 01, 2011 limited to the LAD, left radial artery to the OM, SVG to diagonal, SVG to posterior descending  . Hyperlipidemia   . Hypertension   . Muscle ache    on Crestor  09/04/09 CPK 193(7-232) tolerated simvastatin in the past  . PVC (premature ventricular contraction) 06/2009   November, 2010  . Rash     Past Surgical History:  Procedure Laterality Date  . BACK SURGERY    . coccyx bone removed    . CORONARY ARTERY BYPASS GRAFT   05/01/2011   CABG x4 with LIMA to LAD, LRA to OM, SVG to D1, SVG to PDA, EVH via left thigh  . Intraoperative transesophageal echocardiography.  05/01/2011  . pilonidal cystectomy      Family History  Problem Relation Age of Onset  . Heart attack Paternal Grandfather        late 43s  . Hearing loss Paternal Grandfather   . Hypertension Father   . Heart attack Father 65       bypass  . Diabetes Father   . Hearing loss Father   . Thyroid disease Father   . Cancer Maternal Grandfather        site unknown  . Prostate cancer Maternal Grandfather   . Diabetes Paternal Aunt        X 3  . Heart failure Maternal Grandmother   . Diabetes Mother   . Diabetes Paternal Uncle   . Stroke Neg Hx     Social History:  reports that he has never smoked. He has never used smokeless tobacco. He reports that he does not drink alcohol or use drugs.    Review of Systems   Lipid history: He has been taking fish oil for significantly high triglycerides along with his Lipitor  Triglycerides have been consistently high; his insurance will not cover Vascepa or Lovaza Currently on fenofibrate and OTC fish oil  Last triglycerides 205 with LDL 40    Lab Results  Component Value Date   CHOL 112 04/12/2018   HDL 31.60 (L) 04/12/2018   LDLCALC 64 04/25/2014   LDLDIRECT 57.0 04/12/2018   TRIG 285.0 (H) 04/12/2018   CHOLHDL 4 04/12/2018            THYROID: He has a 2.4 cm dominant left-sided thyroid nodule on Ultrasound in 12/2014  Follow-up showed the same size of the nodules  TSH was 3.4 done by PCP in 11/19  HYPERTENSION:  on  2.5 mg ramipril  Periodically checking at drugstore and his readings are however somewhat variable but not consistently high or low   BP Readings from Last 3 Encounters:  07/12/18 130/78  04/12/18 (!) 150/82  01/24/18 (!) 146/80     LABS:  No visits with results within 1 Week(s) from this visit.  Latest known visit with results is:  Office Visit on 04/12/2018   Component Date Value Ref Range Status  . Hemoglobin A1C 04/12/2018 8.3* 4.0 - 5.6 % Final  . Cholesterol 04/12/2018 112  0 - 200 mg/dL Final   ATP III Classification       Desirable:  < 200 mg/dL               Borderline High:  200 - 239 mg/dL          High:  > = 409 mg/dL  . Triglycerides 04/12/2018 285.0* 0.0 - 149.0 mg/dL Final   Normal:  <811 mg/dLBorderline High:  150 - 199 mg/dL  . HDL 04/12/2018 31.60* >39.00 mg/dL Final  . VLDL 91/47/8295 57.0* 0.0 - 40.0 mg/dL Final  . Total CHOL/HDL Ratio 04/12/2018 4   Final                  Men          Women1/2 Average Risk     3.4          3.3Average Risk          5.0          4.42X Average Risk          9.6          7.13X Average Risk  15.0          11.0                      . NonHDL 04/12/2018 79.94   Final   NOTE:  Non-HDL goal should be 30 mg/dL higher than patient's LDL goal (i.e. LDL goal of < 70 mg/dL, would have non-HDL goal of < 100 mg/dL)  . Sodium 04/12/2018 137  135 - 145 mEq/L Final  . Potassium 04/12/2018 4.3  3.5 - 5.1 mEq/L Final  . Chloride 04/12/2018 104  96 - 112 mEq/L Final  . CO2 04/12/2018 25  19 - 32 mEq/L Final  . Glucose, Bld 04/12/2018 227* 70 - 99 mg/dL Final  . BUN 96/11/5407 16  6 - 23 mg/dL Final  . Creatinine, Ser 04/12/2018 0.70  0.40 - 1.50 mg/dL Final  . Total Bilirubin 04/12/2018 0.6  0.2 - 1.2 mg/dL Final  . Alkaline Phosphatase 04/12/2018 38* 39 - 117 U/L Final  . AST 04/12/2018 14  0 - 37 U/L Final  . ALT 04/12/2018 24  0 - 53 U/L Final  . Total Protein 04/12/2018 7.3  6.0 - 8.3 g/dL Final  . Albumin 81/19/1478 4.5  3.5 - 5.2 g/dL Final  . Calcium 29/56/2130 9.5  8.4 - 10.5 mg/dL Final  . GFR 86/57/8469 120.00  >60.00 mL/min Final  . Direct LDL 04/12/2018 57.0  mg/dL Final   Optimal:  <629 mg/dLNear or Above Optimal:  100-129 mg/dLBorderline High:  130-159 mg/dLHigh:  160-189 mg/dLVery High:  >190 mg/dL    Physical Examination:  BP 130/78 (BP Location: Left Arm, Patient Position: Sitting,  Cuff Size: Normal)   Pulse 75   Ht 5' 7.5" (1.715 m)   Wt 220 lb 9.6 oz (100.1 kg)   SpO2 95%   BMI 34.04 kg/m      ASSESSMENT:  Diabetes type 2 on insulin    See history of present illness for detailed discussion of  current management, blood sugar patterns and problems identified  His A1c is consistently high and was again 8.3 done by PCP last month  Although he is taking [name] he is taking relatively small doses Most likely he is more insulin deficient Likely needs better coverage for his mealtime blood difficult to know how much because he does not check readings after supper Showed him a visual aid to explain how basal and bolus insulin doses need to be adjusted  HYPERTENSION: Blood pressure is fairly good today and also accident with his PCP last month    LIPIDS: Has fair control   PLAN:   Explained to him the need for checking readings after meals to help identify postprandial patterns especially after supper  Increase Farxiga to 10 mg daily  At the same time he can stop taking ramipril for now and watch his blood pressure closely  He will increase his NOVOLOG to 18-20 units before dinner  If his sugars are high after breakfast or lunch he will need NovoLog at those meals also  After about 1 week or so if he has persistently high readings in the mornings he will take 70 units twice daily of Tresiba  Follow-up in about a month   Patient Instructions  Check blood sugars on waking up 4 days a week  Also check blood sugars about 2 hours after meals and do this after different meals by rotation  Recommended blood sugar levels on waking up are 90-130 and about 2 hours after meal  is 130-160  Please bring your blood sugar monitor to each visit, thank you  Novolog 18-20 at supper  If sugar > 180 after other meals start 12 Novolog at those meals   Take full tab of Farxiga daily  Leave off Ramipril if BP under 120  If after 2 weeks am sugar >150 go up to 70  on Tresiba  Walk daily        Counseling time on subjects discussed in assessment and plan sections is over 50% of today's 25 minute visit   Reather LittlerAjay Christne Platts 07/12/2018, 3:07 PM   Note: This office note was prepared with Dragon voice recognition system technology. Any transcriptional errors that result from this process are unintentional.

## 2018-07-12 NOTE — Patient Instructions (Addendum)
Check blood sugars on waking up 4 days a week  Also check blood sugars about 2 hours after meals and do this after different meals by rotation  Recommended blood sugar levels on waking up are 90-130 and about 2 hours after meal is 130-160  Please bring your blood sugar monitor to each visit, thank you  Novolog 18-20 at supper  If sugar > 180 after other meals start 12 Novolog at those meals   Take full tab of Farxiga daily  Leave off Ramipril if BP under 120  If after 2 weeks am sugar >150 go up to 70 on Tresiba  Walk daily

## 2018-08-04 DIAGNOSIS — E119 Type 2 diabetes mellitus without complications: Secondary | ICD-10-CM | POA: Diagnosis not present

## 2018-08-04 DIAGNOSIS — H2513 Age-related nuclear cataract, bilateral: Secondary | ICD-10-CM | POA: Diagnosis not present

## 2018-08-04 LAB — HM DIABETES EYE EXAM

## 2018-08-16 ENCOUNTER — Other Ambulatory Visit: Payer: Self-pay | Admitting: Endocrinology

## 2018-08-17 ENCOUNTER — Other Ambulatory Visit: Payer: Self-pay

## 2018-08-17 ENCOUNTER — Telehealth: Payer: Self-pay | Admitting: Endocrinology

## 2018-08-17 MED ORDER — INSULIN DEGLUDEC 200 UNIT/ML ~~LOC~~ SOPN: 106.0000 [IU] | PEN_INJECTOR | Freq: Every day | SUBCUTANEOUS | 2 refills | Status: DC

## 2018-08-17 NOTE — Telephone Encounter (Signed)
MEDICATION: Insulin Degludec (TRESIBA FLEXTOUCH) 200 UNIT/ML SOPN  PHARMACY:  [id number] - [place], [place] - [address]  IS THIS A 90 DAY SUPPLY :  Yes   IS PATIENT OUT OF MEDICATION: Yes   IF NOT; HOW MUCH IS LEFT:   LAST APPOINTMENT DATE: @1 /14/2020  NEXT APPOINTMENT DATE:@1 /27/2020  DO WE HAVE YOUR PERMISSION TO LEAVE A DETAILED MESSAGE:  OTHER COMMENTS:    **Let patient know to contact pharmacy at the end of the day to make sure medication is ready. **  ** Please notify patient to allow 48-72 hours to process**  **Encourage patient to contact the pharmacy for refills or they can [facility]**

## 2018-08-17 NOTE — Telephone Encounter (Signed)
Rx sent 

## 2018-08-21 ENCOUNTER — Other Ambulatory Visit: Payer: Self-pay | Admitting: Endocrinology

## 2018-08-29 ENCOUNTER — Other Ambulatory Visit: Payer: Self-pay

## 2018-08-29 ENCOUNTER — Encounter: Payer: Self-pay | Admitting: Endocrinology

## 2018-08-29 ENCOUNTER — Ambulatory Visit: Payer: Medicare HMO | Admitting: Endocrinology

## 2018-08-29 VITALS — BP 118/70 | HR 78 | Ht 67.5 in | Wt 221.0 lb

## 2018-08-29 DIAGNOSIS — E1165 Type 2 diabetes mellitus with hyperglycemia: Secondary | ICD-10-CM

## 2018-08-29 DIAGNOSIS — Z794 Long term (current) use of insulin: Secondary | ICD-10-CM | POA: Diagnosis not present

## 2018-08-29 LAB — POCT GLYCOSYLATED HEMOGLOBIN (HGB A1C): HEMOGLOBIN A1C: 7.4 % — AB (ref 4.0–5.6)

## 2018-08-29 MED ORDER — INSULIN DEGLUDEC 200 UNIT/ML ~~LOC~~ SOPN: 136.0000 [IU] | PEN_INJECTOR | Freq: Every day | SUBCUTANEOUS | 3 refills | Status: DC

## 2018-08-29 NOTE — Progress Notes (Signed)
Patient ID: [name] SprinklesHarold D [name], male   DOB: November 30, 1950, 68 y.o.   MRN: [id number]           Reason for Appointment: Follow-up for Type 2 Diabetes   History of Present Illness:          Date of diagnosis of type 2 diabetes mellitus: 2011        Background history:  [name] [name] was probably started on metformin at diagnosis and subsequently given Amaryl also Subsequently had been on Janumet and more recently this [facility]. Because of poor control [name] [name] was given Levemir in addition to his Kombiglyze in 01/2013 The dose was progressively increased from initial dose of 12 units [name] [name] did have somewhat better control right after starting insulin with A1c below 7% but only one time His blood sugars had been consistently high since 2014 with A1c at least 8%  Recent history:   INSULIN regimen is described as: [name] [name] 135 units daily Novolog at suppertime 15  units  Non-insulin hypoglycemic drugs the patient is taking are: Farxiga 10 mg daily, Metformin ER 2 g, Victoza 1.8 mg daily   His A1c is improved at 7.4 although has been as low as 6.8  Current blood sugar patterns, management and problems identified:  [name] [name] is finally taking FARXIGA consistently and daily  [name] [name] is now taking 10 mg  With this [name] [name] appears to be having better blood sugars since his other regimen was not changed significantly  [name] [name] now says that in the evening instead of eating 1 meal [name] [name] is probably eating too large snacks.  Was told to take up to 20 units of NovoLog to cover evening carbohydrates but [name] [name] is taking 14-16 only  Also not clear if his sugars are higher after breakfast or lunch  FASTING readings are better than before but still consistently over 140 and only somewhat better than before  Side effects from medications have been: ?  Dizziness from Invokana  Hypoglycemia: None    Glucose monitoring:  done less than once  in a day         Glucometer: One Touch Verio   Blood Glucose readings from download:     PRE-MEAL Fasting Lunch Dinner Bedtime Overall  Glucose range:  142-215    54-248   Mean/median:  165    215  T8    Self-care: The diet that the patient has been following is: tries to limit sweet drinks.      Meal times: Breakfast: 9 AM Lunch: 1 PM Dinner: 6 PM   Typical meal intake:Lunch is a sandwich or vegetables, variable meal at suppertime.  For snacks will have popcorn                Dietician visit, most recent: 6/16               Exercise:  walking  2-4/7 days somewhat active during the day walking     Weight history: Highest  previously 256  Wt Readings from Last 3 Encounters:  08/29/18 221 lb (100.2 kg)  07/12/18 220 lb 9.6 oz (100.1 kg)  04/12/18 219 lb (99.3 kg)    Glycemic control:   Lab Results  Component Value Date   HGBA1C 7.4 (A) 08/29/2018   HGBA1C 8.3 (A) 04/12/2018   HGBA1C 6.8 11/22/2017   Lab Results  Component Value Date   MICROALBUR <0.7 11/22/2017   LDLCALC 64 04/25/2014   CREATININE 0.70 04/12/2018    Other active problems: See review  of systems    Allergies as of 08/29/2018      Reactions   Rosuvastatin Other (See Comments)   REACTION: myalgias      Medication List       Accurate as of August 29, 2018  9:27 AM. Always use your most recent med list.        aspirin 81 MG tablet Take 81 mg by mouth daily.   atorvastatin 20 MG tablet Commonly known as:  LIPITOR TAKE 1 TABLET BY MOUTH EVERY DAY   dapagliflozin propanediol 10 MG Tabs tablet Commonly known as:  FARXIGA Take 10 mg by mouth daily. Take half tablet daily   fenofibrate micronized 200 MG capsule Commonly known as:  LOFIBRA TAKE 1 CAPSULE (200 MG TOTAL) BY MOUTH DAILY BEFORE BREAKFAST.   glucose blood test strip USE TO TEST BLOOD SUGAR TWICE DAILY DX CODE E11.65   glucose blood test strip Commonly known as:  ONETOUCH VERIO Use as instructed to test sugars once daily   Insulin Degludec 200 UNIT/ML Sopn Commonly known as:  TRESIBA FLEXTOUCH Inject 136  Units into the skin daily. INJECT 136 UNITS UNDER THE SKIN ONCE DAILY.   metFORMIN 500 MG 24 hr tablet Commonly known as:  GLUCOPHAGE-XR Take 4 tablets (2,000 mg total) by mouth daily with supper.   NOVOFINE 32G X 6 MM Misc Generic drug:  Insulin Pen Needle USE TO INJECT INSULIN 2 TIMES PER DAY.   NOVOLOG FLEXPEN 100 UNIT/ML FlexPen Generic drug:  insulin aspart INJECT 12-14 UNITS INTO THE SKIN DAILY BEFORE SUPPER.   Omega 3 1000 MG Caps Take 1,000 mg by mouth daily. TAKE 1 TABLET BY MOUTH ONCE DAILY.   ONE TOUCH LANCETS Misc Use to check sugars once daily   ramipril 2.5 MG capsule Commonly known as:  ALTACE TAKE 1 CAPSULE BY MOUTH EVERY DAY   VICTOZA 18 MG/3ML Sopn Generic drug:  liraglutide INJECT 1.8 MG ONCE DAILY AT THE SAME TIME       Allergies:  Allergies  Allergen Reactions  . Rosuvastatin Other (See Comments)    REACTION: myalgias    Past Medical History:  Diagnosis Date  . Aortic valve sclerosis    echo june 2009  . CAD (coronary artery disease)    a. 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 b. s/p CABG 04/2011.  . Carotid artery disease (HCC)    Doppler, preop, September, 2012,  mild bilateral plaque with no significant stenoses  . Diabetes mellitus   . Dizziness    June, 2013  . Ejection fraction    a. Ejection fraction 60%, echo, June, 2009. b. EF preserved by cath 2012.  [name] Food poisoning    age 86  . GERD (gastroesophageal reflux disease)   . Hx of CABG    [name],  x4  May 01, 2011 limited to the LAD, left radial artery to the OM, SVG to diagonal, SVG to posterior descending  . Hyperlipidemia   . Hypertension   . Muscle ache    on Crestor  09/04/09 CPK 193(7-232) tolerated simvastatin in the past  . PVC (premature ventricular contraction) 06/2009   November, 2010  . Rash     Past Surgical History:  Procedure Laterality Date  . BACK SURGERY    . coccyx bone removed    . CORONARY ARTERY BYPASS GRAFT   05/01/2011   CABG x4 with LIMA to LAD, LRA to OM, SVG to D1, SVG to PDA, EVH via  left thigh  . Intraoperative transesophageal echocardiography.  05/01/2011  . pilonidal cystectomy      Family History  Problem Relation Age of Onset  . Heart attack Paternal Grandfather        late 7360s  . Hearing loss Paternal Grandfather   . Hypertension Father   . Heart attack Father 65       bypass  . Diabetes Father   . Hearing loss Father   . Thyroid disease Father   . Cancer Maternal Grandfather        site unknown  . Prostate cancer Maternal Grandfather   . Diabetes Paternal Aunt        X 3  . Heart failure Maternal Grandmother   . Diabetes Mother   . Diabetes Paternal Uncle   . Stroke Neg Hx     Social History:  reports that [name] [name] has never smoked. [name] [name] has never used smokeless tobacco. [name] [name] reports that [name] [name] does not drink alcohol or use drugs.    Review of Systems   Lipid history: [name] [name] has been taking fish oil for significantly high triglycerides along with his Lipitor  Triglycerides have been consistently high; his insurance will not cover Vascepa or Lovaza Currently on fenofibrate and OTC omega-3 fish oil  Last triglycerides 205 with LDL 40 done in 11/19    Lab Results  Component Value Date   CHOL 112 04/12/2018   HDL 31.60 (L) 04/12/2018   LDLCALC 64 04/25/2014   LDLDIRECT 57.0 04/12/2018   TRIG 285.0 (H) 04/12/2018   CHOLHDL 4 04/12/2018            THYROID: [name] [name] has a 2.4 cm dominant left-sided thyroid nodule on Ultrasound in 12/2014  Follow-up showed the same size of the nodules  TSH was 3.4 done by PCP in 11/19  HYPERTENSION:  on  2.5 mg ramipril  Periodically checking at drugstore and his readings are reportedly fairly good   BP Readings from Last 3 Encounters:  08/29/18 118/70  07/12/18 130/78  04/12/18 (!) 150/82     LABS:  Office Visit on 08/29/2018  Component Date Value Ref Range Status  . Hemoglobin A1C 08/29/2018 7.4* 4.0 - 5.6 % Final    Physical  Examination:  BP 118/70 (BP Location: Left Arm, Patient Position: Sitting, Cuff Size: Normal)   Pulse 78   Ht 5' 7.5" (1.715 m)   Wt 221 lb (100.2 kg)   SpO2 98%   BMI 34.10 kg/m   Standing blood pressure was 120/75    ASSESSMENT:  Diabetes type 2 on insulin    See history of present illness for detailed discussion of  current management, blood sugar patterns and problems identified  His A1c is relatively better at 7.4  [name] [name] is benefiting from increased [name] However appears to be still not getting enough insulin both basal and bolus [name] [name] has difficulty losing weight Although [name] [name] is trying to do some walking [name] [name] is not exercising enough  HYPERTENSION: Blood pressure is fairly good today and not getting low with increasing [name]    LIPIDS: [name] [name] can continue OTC fish oil for now his triglycerides were not significantly high on the last visit even with higher sugars   PLAN:   Increase Tresiba by 6 units at least  With his eating habits [name] [name] will probably need to take 2 separate injections of NovoLog to cover his multiple meals in the evenings up to 20 units total  Discussed blood sugar targets  Follow-up in about 3 months  Patient Instructions  [name] [name] 136 units daily  Novolog 8-10 for each "meal" at nite  Walk daily      Reather Littler 08/29/2018, 9:27 AM   Note: This office note was prepared with Dragon voice recognition system technology. Any transcriptional errors that result from this process are unintentional.

## 2018-08-29 NOTE — Patient Instructions (Addendum)
Tresiba 136 units daily  Novolog 8-10 for each "meal" at [employer] daily

## 2018-09-11 ENCOUNTER — Other Ambulatory Visit: Payer: Self-pay | Admitting: Endocrinology

## 2018-10-25 ENCOUNTER — Other Ambulatory Visit: Payer: Self-pay | Admitting: Endocrinology

## 2018-10-25 ENCOUNTER — Telehealth: Payer: Self-pay | Admitting: Endocrinology

## 2018-10-25 MED ORDER — INSULIN DEGLUDEC 200 UNIT/ML ~~LOC~~ SOPN: 136.0000 [IU] | PEN_INJECTOR | Freq: Every day | SUBCUTANEOUS | 3 refills | Status: DC

## 2018-10-25 NOTE — Telephone Encounter (Signed)
MEDICATION:  Insulin Degludec (TRESIBA FLEXTOUCH) 200 UNIT/ML SOPN  [facility]  IS THIS A 90 DAY SUPPLY :   IS PATIENT OUT OF MEDICATION:   IF NOT; HOW MUCH IS LEFT:   LAST APPOINTMENT DATE: @3 /24/2020  NEXT APPOINTMENT DATE:@4 /27/2020  DO WE HAVE YOUR PERMISSION TO LEAVE A DETAILED MESSAGE:  OTHER COMMENTS:  Please Call Patient  **Let patient know to contact pharmacy at the end of the day to make sure medication is ready. **  ** Please notify patient to allow 48-72 hours to process**  **Encourage patient to contact the pharmacy for refills or they can request refills through [facility]**

## 2018-10-25 NOTE — Telephone Encounter (Signed)
Rx sent 

## 2018-10-26 ENCOUNTER — Other Ambulatory Visit: Payer: Self-pay

## 2018-10-26 ENCOUNTER — Telehealth: Payer: Self-pay | Admitting: Endocrinology

## 2018-10-26 MED ORDER — INSULIN DEGLUDEC 200 UNIT/ML ~~LOC~~ SOPN: 136.0000 [IU] | PEN_INJECTOR | Freq: Every day | SUBCUTANEOUS | 3 refills | Status: DC

## 2018-10-26 NOTE — Telephone Encounter (Signed)
April from [facility] called the [facility] this morning and stated that they do not have an rx on file for the patient tresiba,. [facility]     [id number] - [place],  - [address] AT [address]

## 2018-10-26 NOTE — Telephone Encounter (Signed)
Rx sent 

## 2018-11-28 ENCOUNTER — Other Ambulatory Visit: Payer: Self-pay

## 2018-11-28 ENCOUNTER — Encounter: Payer: Self-pay | Admitting: Endocrinology

## 2018-11-28 ENCOUNTER — Ambulatory Visit: Payer: Medicare HMO | Admitting: Endocrinology

## 2018-11-28 VITALS — BP 110/70 | HR 78 | Ht 67.5 in | Wt 218.2 lb

## 2018-11-28 DIAGNOSIS — E1165 Type 2 diabetes mellitus with hyperglycemia: Secondary | ICD-10-CM

## 2018-11-28 DIAGNOSIS — Z794 Long term (current) use of insulin: Secondary | ICD-10-CM | POA: Diagnosis not present

## 2018-11-28 DIAGNOSIS — I10 Essential (primary) hypertension: Secondary | ICD-10-CM | POA: Diagnosis not present

## 2018-11-28 LAB — BASIC METABOLIC PANEL
BUN: 15 mg/dL (ref 6–23)
CO2: 26 mEq/L (ref 19–32)
Calcium: 9.4 mg/dL (ref 8.4–10.5)
Chloride: 106 mEq/L (ref 96–112)
Creatinine, Ser: 0.81 mg/dL (ref 0.40–1.50)
GFR: 95.22 mL/min (ref >60.00)
Glucose, Bld: 155 mg/dL — ABNORMAL HIGH (ref 70–99)
Potassium: 4.5 mEq/L (ref 3.5–5.1)
Sodium: 140 mEq/L (ref 135–145)

## 2018-11-28 LAB — POCT GLYCOSYLATED HEMOGLOBIN (HGB A1C): Hemoglobin A1C: 7.5 % — AB (ref 4.0–5.6)

## 2018-11-28 NOTE — Patient Instructions (Addendum)
Use Treadmill or walk in place/aerobic videos  Check blood sugars on waking up days a week  Also check blood sugars about 2 hours after meals and do this after different meals by rotation  Recommended blood sugar levels on waking up are 90-130 and about 2 hours after meal is 130-160  Please bring your blood sugar monitor to each visit, thank you  Take extra 4-8 units for large snacks

## 2018-11-28 NOTE — Progress Notes (Signed)
Patient ID: [name] [name], male   DOB: 12/19/1950, 68 y.o.   MRN: [id number]           Reason for Appointment: Follow-up for Type 2 Diabetes   History of Present Illness:          Date of diagnosis of type 2 diabetes mellitus: 2011        Background history:  He was probably started on metformin at diagnosis and subsequently given Amaryl also Subsequently had been on Janumet and more recently this was changed to [facility]. Because of poor control he was given Levemir in addition to his Kombiglyze in 01/2013 The dose was progressively increased from initial dose of 12 units He did have somewhat better control right after starting insulin with A1c below 7% but only one time His blood sugars had been consistently high since 2014 with A1c at least 8%  Recent history:   INSULIN regimen is described as: [name] 135 units daily.  Novolog at suppertime 14  units  Non-insulin hypoglycemic drugs the patient is taking are: Farxiga 10 mg daily, Metformin ER 2 g, Victoza 1.8 mg daily   His A1c is still about the same and today 7.5.  Has been as low as 6.8  Current blood sugar patterns, management and problems identified:  He is not taking his blood sugars enough  He has a few readings in the mornings which are mostly high  However he thinks that he had an episode of low sugar before breakfast sometime ago probably been he did not eat much for snacks the night before  He was advised to take additional 6 units or more of NovoLog in the evening when he has large snack but he does not remember doing this  Also not sure what they are his blood sugars are routinely high after his meals says he does not monitor  He has 2 readings after dinner which are high 1 from eating pizza and another one from drinking juice  Taking Farxiga, metformin and Victoza regularly  However he was told to increase his [name] on the last visit up to 240 and he has not done so  Despite his not doing much  walking for exercise he is gaining weight  Side effects from medications have been: ?  Dizziness from Invokana  Hypoglycemia: None    Glucose monitoring:  done less than once  in a day         Glucometer: One Touch Verio   Blood Glucose readings from download:    PRE-MEAL Fasting Lunch Dinner Bedtime Overall  Glucose range:  150-194    250, 319   Mean/median: 169    169   Previous readings:  PRE-MEAL Fasting Lunch Dinner Bedtime Overall  Glucose range:  142-215    54-248   Mean/median:  165    215      Self-care: The diet that the patient has been following is: tries to limit sweet drinks.      Meal times: Breakfast: 9 AM Lunch: 1 PM Dinner: 6 PM   Typical meal intake:Lunch is a sandwich or vegetables, variable meal at suppertime.                Dietician visit, most recent: 6/16               Exercise: Minimal, not walking    Weight history: Highest  previously 256  Wt Readings from Last 3 Encounters:  11/28/18 218 lb 3.2 oz (99 kg)  08/29/18 221 lb (100.2 kg)  07/12/18 220 lb 9.6 oz (100.1 kg)    Glycemic control:   Lab Results  Component Value Date   HGBA1C 7.5 (A) 11/28/2018   HGBA1C 7.4 (A) 08/29/2018   HGBA1C 8.3 (A) 04/12/2018   Lab Results  Component Value Date   MICROALBUR <0.7 11/22/2017   LDLCALC 64 04/25/2014   CREATININE 0.70 04/12/2018    Other active problems: See review of systems    Allergies as of 11/28/2018      Reactions   Rosuvastatin Other (See Comments)   REACTION: myalgias      Medication List       Accurate as of November 28, 2018 10:32 AM. Always use your most recent med list.        aspirin 81 MG tablet Take 81 mg by mouth daily.   atorvastatin 20 MG tablet Commonly known as:  LIPITOR TAKE 1 TABLET BY MOUTH EVERY DAY   Farxiga 10 MG Tabs tablet Generic drug:  dapagliflozin propanediol TAKE 1 TABLET BY MOUTH EVERY DAY   fenofibrate micronized 200 MG capsule Commonly known as:  LOFIBRA TAKE 1 CAPSULE (200 MG  TOTAL) BY MOUTH DAILY BEFORE BREAKFAST.   glucose blood test strip USE TO TEST BLOOD SUGAR TWICE DAILY DX CODE E11.65   glucose blood test strip Commonly known as:  OneTouch Verio Use as instructed to test sugars once daily   Insulin Degludec 200 UNIT/ML Sopn Commonly known as:  [name] FlexTouch Inject 136 Units into the skin daily. INJECT 136 UNITS UNDER THE SKIN ONCE DAILY.   metFORMIN 500 MG 24 hr tablet Commonly known as:  GLUCOPHAGE-XR TAKE 4 TABLETS BY MOUTH DAILY WITH SUPPER.   NovoFine 32G X 6 MM Misc Generic drug:  Insulin Pen Needle USE TO INJECT INSULIN 2 TIMES PER DAY.   NovoLOG FlexPen 100 UNIT/ML FlexPen Generic drug:  insulin aspart INJECT 12-14 UNITS INTO THE SKIN DAILY BEFORE SUPPER.   Omega 3 1000 MG Caps Take 1,000 mg by mouth daily. TAKE 1 TABLET BY MOUTH ONCE DAILY.   ONE TOUCH LANCETS Misc Use to check sugars once daily   ramipril 2.5 MG capsule Commonly known as:  ALTACE TAKE 1 CAPSULE BY MOUTH EVERY DAY   Victoza 18 MG/3ML Sopn Generic drug:  liraglutide INJECT 1.8 MG ONCE DAILY AT THE SAME TIME       Allergies:  Allergies  Allergen Reactions  . Rosuvastatin Other (See Comments)    REACTION: myalgias    Past Medical History:  Diagnosis Date  . Aortic valve sclerosis    echo june 2009  . CAD (coronary artery disease)    a. 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 b. s/p CABG 04/2011.  . Carotid artery disease (HCC)    Doppler, preop, September, 2012,  mild bilateral plaque with no significant stenoses  . Diabetes mellitus   . Dizziness    June, 2013  . Ejection fraction    a. Ejection fraction 60%, echo, June, 2009. b. EF preserved by cath 2012.  [name] Food poisoning    age 2  . GERD (gastroesophageal reflux disease)   . Hx of CABG    [name],  x4  May 01, 2011 limited to the LAD, left radial artery to the OM, SVG to diagonal, SVG to posterior descending  . Hyperlipidemia   . Hypertension    . Muscle ache    on Crestor  09/04/09 CPK 193(7-232) tolerated simvastatin in  the past  . PVC (premature ventricular contraction) 06/2009   November, 2010  . Rash     Past Surgical History:  Procedure Laterality Date  . BACK SURGERY    . coccyx bone removed    . CORONARY ARTERY BYPASS GRAFT  05/01/2011   CABG x4 with LIMA to LAD, LRA to OM, SVG to D1, SVG to PDA, EVH via left thigh  . Intraoperative transesophageal echocardiography.  05/01/2011  . pilonidal cystectomy      Family History  Problem Relation Age of Onset  . Heart attack Paternal Grandfather        late 66s  . Hearing loss Paternal Grandfather   . Hypertension Father   . Heart attack Father 65       bypass  . Diabetes Father   . Hearing loss Father   . Thyroid disease Father   . Cancer Maternal Grandfather        site unknown  . Prostate cancer Maternal Grandfather   . Diabetes Paternal Aunt        X 3  . Heart failure Maternal Grandmother   . Diabetes Mother   . Diabetes Paternal Uncle   . Stroke Neg Hx     Social History:  reports that he has never smoked. He has never used smokeless tobacco. He reports that he does not drink alcohol or use drugs.    Review of Systems   Lipid history: He has been taking fish oil for significantly high triglycerides along with his Lipitor  Triglycerides have been consistently high; his insurance will not cover Vascepa or Lovaza Currently on fenofibrate and OTC omega-3 fish oil  Last triglycerides 205 with LDL 40 done in 11/19    Lab Results  Component Value Date   CHOL 112 04/12/2018   HDL 31.60 (L) 04/12/2018   LDLCALC 64 04/25/2014   LDLDIRECT 57.0 04/12/2018   TRIG 285.0 (H) 04/12/2018   CHOLHDL 4 04/12/2018            THYROID: He has a 2.4 cm dominant left-sided thyroid nodule on Ultrasound in 12/2014  Follow-up showed the same size of the nodules  TSH was 3.4 done by PCP in 11/19  HYPERTENSION:  on  2.5 mg ramipril  Periodically checking and  110-120/70-80   BP Readings from Last 3 Encounters:  11/28/18 110/70  08/29/18 118/70  07/12/18 130/78     LABS:  Office Visit on 11/28/2018  Component Date Value Ref Range Status  . Hemoglobin A1C 11/28/2018 7.5* 4.0 - 5.6 % Final    Physical Examination:  BP 110/70 (BP Location: Left Arm, Patient Position: Sitting, Cuff Size: Normal)   Pulse 78   Ht 5' 7.5" (1.715 m)   Wt 218 lb 3.2 oz (99 kg)   SpO2 97%   BMI 33.67 kg/m   Feet are normal to inspection    ASSESSMENT:  Diabetes type 2 on insulin    See history of present illness for detailed discussion of  current management, blood sugar patterns and problems identified  His A1c is still relatively high at 7.5  However his blood sugars are averaging nearly 190 at home Discussed day-to-day management of his diabetes  He can do better with regular exercise and discussed ways of doing this even if he cannot walk outside in his neighborhood He can also cover his evening meal and snack better with more NovoLog but this will depend on his postprandial readings He is not monitoring readings routinely after meals  and not clear what his patterns are Likely fasting readings may be better if he covers his evening meal and snack with enough NovoLog Currently taking large amounts of basal insulin  HYPERTENSION: Blood pressure is excellent On Farxiga on low-dose ramipril    LIPIDS: Followed by PCP  PLAN:  As in patient instructions he will need to start using treadmill or do other exercise regimen Discussed blood sugar monitoring after meals Also recommended adding NovoLog for evening snacks but amounts to be determined based on his blood sugar pattern No change in current insulin doses otherwise Continue Farxiga, metformin and Victoza   Patient Instructions  Use Treadmill or walk in place/aerobic videos  Check blood sugars on waking up days a week  Also check blood sugars about 2 hours after meals and do this  after different meals by rotation  Recommended blood sugar levels on waking up are 90-130 and about 2 hours after meal is 130-160  Please bring your blood sugar monitor to each visit, thank you  Take extra 4-8 units for large snacks        Wilmer Berryhill 11/28/2018, 10:32 AM   Note: This office note was prepared with Dragon voice recognition system technology. Any transcriptional errors that result from this process are unintentional.

## 2018-11-30 ENCOUNTER — Other Ambulatory Visit: Payer: Self-pay | Admitting: Endocrinology

## 2018-12-19 ENCOUNTER — Other Ambulatory Visit: Payer: Self-pay | Admitting: Endocrinology

## 2018-12-22 DIAGNOSIS — H269 Unspecified cataract: Secondary | ICD-10-CM | POA: Diagnosis not present

## 2018-12-22 DIAGNOSIS — E119 Type 2 diabetes mellitus without complications: Secondary | ICD-10-CM | POA: Diagnosis not present

## 2018-12-22 DIAGNOSIS — I119 Hypertensive heart disease without heart failure: Secondary | ICD-10-CM | POA: Diagnosis not present

## 2018-12-22 DIAGNOSIS — E669 Obesity, unspecified: Secondary | ICD-10-CM | POA: Diagnosis not present

## 2018-12-22 DIAGNOSIS — Z794 Long term (current) use of insulin: Secondary | ICD-10-CM | POA: Diagnosis not present

## 2018-12-22 DIAGNOSIS — I25810 Atherosclerosis of coronary artery bypass graft(s) without angina pectoris: Secondary | ICD-10-CM | POA: Diagnosis not present

## 2018-12-25 ENCOUNTER — Other Ambulatory Visit: Payer: Self-pay | Admitting: Endocrinology

## 2019-01-14 ENCOUNTER — Other Ambulatory Visit: Payer: Self-pay | Admitting: Endocrinology

## 2019-01-26 ENCOUNTER — Other Ambulatory Visit: Payer: Self-pay | Admitting: Cardiology

## 2019-01-27 ENCOUNTER — Other Ambulatory Visit: Payer: Self-pay | Admitting: Cardiology

## 2019-02-23 ENCOUNTER — Other Ambulatory Visit: Payer: Self-pay

## 2019-02-24 ENCOUNTER — Other Ambulatory Visit: Payer: Self-pay | Admitting: Cardiology

## 2019-02-24 ENCOUNTER — Other Ambulatory Visit: Payer: Self-pay | Admitting: Endocrinology

## 2019-02-27 ENCOUNTER — Ambulatory Visit: Payer: Medicare HMO | Admitting: Endocrinology

## 2019-03-01 ENCOUNTER — Encounter: Payer: Self-pay | Admitting: Endocrinology

## 2019-03-01 ENCOUNTER — Other Ambulatory Visit: Payer: Self-pay

## 2019-03-01 ENCOUNTER — Ambulatory Visit (INDEPENDENT_AMBULATORY_CARE_PROVIDER_SITE_OTHER): Payer: Medicare HMO | Admitting: Endocrinology

## 2019-03-01 VITALS — BP 148/92 | HR 86 | Temp 97.7°F | Ht 67.5 in | Wt 218.0 lb

## 2019-03-01 DIAGNOSIS — I10 Essential (primary) hypertension: Secondary | ICD-10-CM | POA: Diagnosis not present

## 2019-03-01 DIAGNOSIS — E782 Mixed hyperlipidemia: Secondary | ICD-10-CM | POA: Diagnosis not present

## 2019-03-01 DIAGNOSIS — Z794 Long term (current) use of insulin: Secondary | ICD-10-CM | POA: Diagnosis not present

## 2019-03-01 DIAGNOSIS — E1165 Type 2 diabetes mellitus with hyperglycemia: Secondary | ICD-10-CM

## 2019-03-01 LAB — COMPREHENSIVE METABOLIC PANEL
ALT: 27 U/L (ref 0–53)
AST: 19 U/L (ref 0–37)
Albumin: 4.7 g/dL (ref 3.5–5.2)
Alkaline Phosphatase: 32 U/L — ABNORMAL LOW (ref 39–117)
BUN: 10 mg/dL (ref 6–23)
CO2: 28 mEq/L (ref 19–32)
Calcium: 9.6 mg/dL (ref 8.4–10.5)
Chloride: 106 mEq/L (ref 96–112)
Creatinine, Ser: 0.71 mg/dL (ref 0.40–1.50)
GFR: 110.77 mL/min (ref >60.00)
Glucose, Bld: 136 mg/dL — ABNORMAL HIGH (ref 70–99)
Potassium: 4 mEq/L (ref 3.5–5.1)
Sodium: 141 mEq/L (ref 135–145)
Total Bilirubin: 0.6 mg/dL (ref 0.2–1.2)
Total Protein: 7.1 g/dL (ref 6.0–8.3)

## 2019-03-01 LAB — POCT GLYCOSYLATED HEMOGLOBIN (HGB A1C): Hemoglobin A1C: 7.7 % — AB (ref 4.0–5.6)

## 2019-03-01 LAB — MICROALBUMIN / CREATININE URINE RATIO
Creatinine,U: 77.1 mg/dL
Microalb Creat Ratio: 0.9 mg/g (ref 0.0–30.0)
Microalb, Ur: <0.7 mg/dL (ref 0.0–1.9)

## 2019-03-01 LAB — LIPID PANEL
Cholesterol: 115 mg/dL (ref 0–200)
HDL: 33 mg/dL — ABNORMAL LOW (ref >39.00)
NonHDL: 81.68
Total CHOL/HDL Ratio: 3
Triglycerides: 216 mg/dL — ABNORMAL HIGH (ref 0.0–149.0)
VLDL: 43.2 mg/dL — ABNORMAL HIGH (ref 0.0–40.0)

## 2019-03-01 LAB — LDL CHOLESTEROL, DIRECT: Direct LDL: 60 mg/dL

## 2019-03-01 NOTE — Progress Notes (Signed)
Patient ID: [name] [name], male   DOB: 28-May-1951, 68 y.o.   MRN: [id number]           Reason for Appointment: Follow-up for Type 2 Diabetes   History of Present Illness:          Date of diagnosis of type 2 diabetes mellitus: 2011        Background history:  He was probably started on metformin at diagnosis and subsequently given Amaryl also Subsequently had been on Janumet and more recently this was [facility]. Because of poor control he was given Levemir in addition to his Kombiglyze in 01/2013 The dose was progressively increased from initial dose of 12 units He did have somewhat better control right after starting insulin with A1c below 7% but only one time His blood sugars had been consistently high since 2014 with A1c at least 8%  Recent history:   INSULIN regimen is described as: [place] 135 units daily.  Novolog at suppertime 12-16  units  Non-insulin hypoglycemic drugs the patient is taking are: Farxiga 10 mg daily, Metformin ER 2 g, Victoza 1.8 mg daily   His A1c is slightly higher at 7.7. Has been as low as 6.8  Current blood sugar patterns, management and problems identified:  He is checking blood sugars mostly fasting and these are generally high with only a couple of readings below 150  He is again not motivated to improve his diabetes control  Generally not taking NovoLog consistently and only taking small amounts, usually 12 units  This is despite his noticing that his blood sugars are usually significantly high after dinner whenever he checks them  Also his diet can be variable and sometimes relatively high fat again  He had been asked to start exercising regularly such as trying to be walking on his treadmill but he has not been motivated to do this again  His weight is about the same No side effects from [name] or Victoza  Side effects from medications have been: ?  Dizziness from Invokana  Hypoglycemia: None    Glucose monitoring:   done less than once  in a day         Glucometer: One Touch Verio    Blood Glucose readings from monitor review:    PRE-MEAL Fasting Lunch Dinner Bedtime Overall  Glucose range: 133-198      Mean/median:     178   POST-MEAL PC Breakfast PC Lunch PC Dinner  Glucose range:    316, 275  Mean/median:      Previous readings:  PRE-MEAL Fasting Lunch Dinner Bedtime Overall  Glucose range:  150-194    250, 319   Mean/median: 169    169     Self-care: The diet that the patient has been following is: tries to limit sweet drinks.      Meal times: Breakfast: 9 AM Lunch: 1 PM Dinner: 6 PM   Typical meal intake:Lunch is a sandwich or vegetables, variable meal at suppertime.                Dietician visit, most recent: 6/16  Weight history: Highest  previously 256  Wt Readings from Last 3 Encounters:  03/01/19 218 lb (98.9 kg)  11/28/18 218 lb 3.2 oz (99 kg)  08/29/18 221 lb (100.2 kg)    Glycemic control:   Lab Results  Component Value Date   HGBA1C 7.7 (A) 03/01/2019   HGBA1C 7.5 (A) 11/28/2018   HGBA1C 7.4 (A) 08/29/2018  Lab Results  Component Value Date   MICROALBUR <0.7 03/01/2019   LDLCALC 64 04/25/2014   CREATININE 0.71 03/01/2019    Other active problems: See review of systems    Allergies as of 03/01/2019      Reactions   Rosuvastatin Other (See Comments)   REACTION: myalgias      Medication List       Accurate as of March 01, 2019 11:59 PM. If you have any questions, ask your nurse or doctor.        aspirin 81 MG tablet Take 81 mg by mouth daily.   atorvastatin 20 MG tablet Commonly known as: LIPITOR TAKE 1 TABLET(20 MG) BY MOUTH DAILY   Farxiga 10 MG Tabs tablet Generic drug: dapagliflozin propanediol TAKE 1 TABLET BY MOUTH EVERY DAY   fenofibrate micronized 200 MG capsule Commonly known as: LOFIBRA TAKE 1 CAPSULE BY MOUTH EVERY DAY   glucose blood test strip USE TO TEST BLOOD SUGAR TWICE DAILY DX CODE E11.65   glucose blood test strip  Commonly known as: OneTouch Verio Use as instructed to test sugars once daily   Insulin Aspart FlexPen 100 UNIT/ML Sopn INJECT 12 TO 14 UNITS UNDER THE SKIN BEFORE SUPPER   Insulin Degludec 200 UNIT/ML Sopn Commonly known as: [place] FlexTouch Inject 136 Units into the skin daily. INJECT 136 UNITS UNDER THE SKIN ONCE DAILY.   metFORMIN 500 MG 24 hr tablet Commonly known as: GLUCOPHAGE-XR TAKE 4 TABLETS BY MOUTH DAILY WITH SUPPER   NovoFine 32G X 6 MM Misc Generic drug: Insulin Pen Needle USE TO INJECT INSULIN 2 TIMES PER DAY.   Omega 3 1000 MG Caps Take 1,000 mg by mouth daily. TAKE 1 TABLET BY MOUTH ONCE DAILY.   ONE TOUCH LANCETS Misc Use to check sugars once daily   ramipril 2.5 MG capsule Commonly known as: ALTACE TAKE 1 CAPSULE BY MOUTH EVERY DAY   Victoza 18 MG/3ML Sopn Generic drug: liraglutide INJECT 1.8 MG ONCE DAILY AT THE SAME TIME EACH DAY.       Allergies:  Allergies  Allergen Reactions  . Rosuvastatin Other (See Comments)    REACTION: myalgias    Past Medical History:  Diagnosis Date  . Aortic valve sclerosis    echo june 2009  . CAD (coronary artery disease)    a. 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 b. s/p CABG 04/2011.  . Carotid artery disease (HCC)    Doppler, preop, September, 2012,  mild bilateral plaque with no significant stenoses  . Diabetes mellitus   . Dizziness    June, 2013  . Ejection fraction    a. Ejection fraction 60%, echo, June, 2009. b. EF preserved by cath 2012.  [name]. Food poisoning    age 68  . GERD (gastroesophageal reflux disease)   . Hx of CABG    [name] [name],  x4  May 01, 2011 limited to the LAD, left radial artery to the OM, SVG to diagonal, SVG to posterior descending  . Hyperlipidemia   . Hypertension   . Muscle ache    on Crestor  09/04/09 CPK 193(7-232) tolerated simvastatin in the past  . PVC (premature ventricular contraction) 06/2009   November, 2010  . Rash      Past Surgical History:  Procedure Laterality Date  . BACK SURGERY    . coccyx bone removed    . CORONARY ARTERY BYPASS GRAFT  05/01/2011   CABG x4 with LIMA to LAD, LRA  to OM, SVG to D1, SVG to PDA, EVH via left thigh  . Intraoperative transesophageal echocardiography.  05/01/2011  . pilonidal cystectomy      Family History  Problem Relation Age of Onset  . Heart attack Paternal Grandfather        late 3360s  . Hearing loss Paternal Grandfather   . Hypertension Father   . Heart attack Father 65       bypass  . Diabetes Father   . Hearing loss Father   . Thyroid disease Father   . Cancer Maternal Grandfather        site unknown  . Prostate cancer Maternal Grandfather   . Diabetes Paternal Aunt        X 3  . Heart failure Maternal Grandmother   . Diabetes Mother   . Diabetes Paternal Uncle   . Stroke Neg Hx     Social History:  reports that he has never smoked. He has never used smokeless tobacco. He reports that he does not drink alcohol or use drugs.    Review of Systems   Lipid history: He has been taking fish oil for significantly high triglycerides along with his Lipitor  Triglycerides have been consistently high; his insurance will not cover Vascepa or Lovaza Currently on fenofibrate and OTC omega-3 fish oil  Last triglycerides 205 with LDL 40 done in 11/19    Lab Results  Component Value Date   CHOL 115 03/01/2019   HDL 33.00 (L) 03/01/2019   LDLCALC 64 04/25/2014   LDLDIRECT 60.0 03/01/2019   TRIG 216.0 (H) 03/01/2019   CHOLHDL 3 03/01/2019            THYROID: He has a 2.4 cm dominant left-sided thyroid nodule on Ultrasound in 12/2014  Follow-up showed the same size of the nodules  TSH was 3.4 done by PCP in 11/19  HYPERTENSION:  on  2.5 mg ramipril  Periodically checking at home and he thinks that blood pressure has not been usually high He has not checked recently He thinks blood pressure may be higher today from anxiety   BP Readings from Last  3 Encounters:  03/01/19 (!) 148/92  11/28/18 110/70  08/29/18 118/70     LABS:  Office Visit on 03/01/2019  Component Date Value Ref Range Status  . Hemoglobin A1C 03/01/2019 7.7* 4.0 - 5.6 % Final  . Cholesterol 03/01/2019 115  0 - 200 mg/dL Final   ATP III Classification       Desirable:  < 200 mg/dL               Borderline High:  200 - 239 mg/dL          High:  > = 161240 mg/dL  . Triglycerides 03/01/2019 216.0* 0.0 - 149.0 mg/dL Final   Normal:  <096<150 mg/dLBorderline High:  150 - 199 mg/dL  . HDL 03/01/2019 33.00* >39.00 mg/dL Final  . VLDL 04/54/098107/29/2020 43.2* 0.0 - 40.0 mg/dL Final  . Total CHOL/HDL Ratio 03/01/2019 3   Final                  Men          Women1/2 Average Risk     3.4          3.3Average Risk          5.0          4.42X Average Risk          9.6  7.13X Average Risk          15.0          11.0                      . NonHDL 03/01/2019 81.68   Final   NOTE:  Non-HDL goal should be 30 mg/dL higher than patient's LDL goal (i.e. LDL goal of < 70 mg/dL, would have non-HDL goal of < 100 mg/dL)  . Sodium 03/01/2019 141  135 - 145 mEq/L Final  . Potassium 03/01/2019 4.0  3.5 - 5.1 mEq/L Final  . Chloride 03/01/2019 106  96 - 112 mEq/L Final  . CO2 03/01/2019 28  19 - 32 mEq/L Final  . Glucose, Bld 03/01/2019 136* 70 - 99 mg/dL Final  . BUN 03/01/2019 10  6 - 23 mg/dL Final  . Creatinine, Ser 03/01/2019 0.71  0.40 - 1.50 mg/dL Final  . Total Bilirubin 03/01/2019 0.6  0.2 - 1.2 mg/dL Final  . Alkaline Phosphatase 03/01/2019 32* 39 - 117 U/L Final  . AST 03/01/2019 19  0 - 37 U/L Final  . ALT 03/01/2019 27  0 - 53 U/L Final  . Total Protein 03/01/2019 7.1  6.0 - 8.3 g/dL Final  . Albumin 03/01/2019 4.7  3.5 - 5.2 g/dL Final  . Calcium 03/01/2019 9.6  8.4 - 10.5 mg/dL Final  . GFR 03/01/2019 110.77  >60.00 mL/min Final  . Microalb, Ur 03/01/2019 <0.7  0.0 - 1.9 mg/dL Final  . Creatinine,U 03/01/2019 77.1  mg/dL Final  . Microalb Creat Ratio 03/01/2019 0.9  0.0 - 30.0  mg/g Final  . Direct LDL 03/01/2019 60.0  mg/dL Final   Optimal:  <100 mg/dLNear or Above Optimal:  100-129 mg/dLBorderline High:  130-159 mg/dLHigh:  160-189 mg/dLVery High:  >190 mg/dL    Physical Examination:  BP (!) 148/92 (BP Location: Left Arm, Patient Position: Sitting, Cuff Size: Normal)   Pulse 86   Temp 97.7 F (36.5 C) (Oral)   Ht 5' 7.5" (1.715 m)   Wt 218 lb (98.9 kg)   SpO2 98%   BMI 33.64 kg/m     ASSESSMENT:  Diabetes type 2 on insulin    See history of present illness for detailed discussion of  current management, blood sugar patterns and problems identified  His A1c is still relatively high at 7.7  As discussed above his motivation to improve his lifestyle, take mealtime insulin has been poor He has not been concerned about his blood sugars being as high as 316 Discussed that since he is needing large amounts of insulin as judged by his [place] dose he will likely need more NovoLog also by taking [place] and Victoza  However his blood sugars are averaging nearly 190 at home Discussed day-to-day management of his diabetes  He can do better with regular exercise and discussed ways of doing this even if he cannot walk outside in his neighborhood He can also cover his evening meal and snack better with more NovoLog but this will depend on his postprandial readings He is not monitoring readings routinely after meals and not clear what his patterns are Likely fasting readings may be better if he covers his evening meal and snack with enough NovoLog Currently taking large amounts of basal insulin  HYPERTENSION: Blood pressure is higher today but he thinks this is from anxiety  On Farxiga and also low-dose ramipril He will need to check his blood pressure regularly and let us know  if it is persistently high    LIPIDS: Followed by PCP will need follow-up labs  PLAN:  He will go up to at least 5 units on his Evaristo Bury Most likely will need to use 20 units of  NovoLog at suppertime but his dose will need to be adjusted based on how high his sugars are after dinner with this dose Discussed that he may need to adjusted by at least 2 to 4 units and take larger doses for meals that are containing more carbohydrate Generally needs to start watching his diet with cutting back on high fat and high carbohydrate meals He will start walking his treadmill daily or any other exercise of his choice He will need to start NovoLog at lunchtime also since likely he has high readings with this meal which she is usually containing carbohydrates like bread  Discussed that using the continuous glucose monitoring is usually more beneficial but he needs to check his sugars 4 times a day to qualify for the freestyle libre and given information on this and discussed benefits of using this  Continue Farxiga, metformin and Victoza unchanged   Patient Instructions  Check blood sugars on waking up 3 days a week  Also check blood sugars about 2 hours after meals and do this after different meals by rotation  Recommended blood sugar levels on waking up are 90-130 and about 2 hours after meal is 130-160  Please bring your blood sugar monitor to each visit, thank you  novolog avg 20 U at supper  Lunch dose 15 unts  Tresiba 140  Check 4x daily to qulaify for [place]    Counseling time on subjects discussed in assessment and plan sections is over 50% of today's 25 minute visit   Reather Littler 03/02/2019, 10:50 AM   Note: This office note was prepared with Dragon voice recognition system technology. Any transcriptional errors that result from this process are unintentional.

## 2019-03-01 NOTE — Patient Instructions (Addendum)
Check blood sugars on waking up 3 days a week  Also check blood sugars about 2 hours after meals and do this after different meals by rotation  Recommended blood sugar levels on waking up are 90-130 and about 2 hours after meal is 130-160  Please bring your blood sugar monitor to each visit, thank you  novolog avg 20 U at supper  Lunch dose 15 unts  Tresiba 140  Check 4x daily to [employer] for [employer]

## 2019-04-24 ENCOUNTER — Telehealth: Payer: Self-pay | Admitting: Endocrinology

## 2019-04-24 ENCOUNTER — Other Ambulatory Visit: Payer: Self-pay

## 2019-04-24 DIAGNOSIS — R69 Illness, unspecified: Secondary | ICD-10-CM | POA: Diagnosis not present

## 2019-04-24 MED ORDER — ONETOUCH DELICA LANCETS 30G MISC: 1.0000 | Freq: Every day | 2 refills | Status: DC

## 2019-04-24 MED ORDER — ONETOUCH VERIO VI STRP: ORAL_STRIP | 2 refills | Status: DC

## 2019-04-24 NOTE — Telephone Encounter (Signed)
MEDICATION:  One Touch Verio Test Strips    One Touch Verio Test Lancets  PHARMACY:  [facility] in [place]   Phone: [phone]  IS THIS A 90 DAY SUPPLY :  YES  IS PATIENT OUT OF MEDICATION: yes  IF NOT; HOW MUCH IS LEFT:   LAST APPOINTMENT DATE: @7 /29/2020  NEXT APPOINTMENT DATE:@10 /29/2020  DO WE HAVE YOUR PERMISSION TO LEAVE A DETAILED MESSAGE:  OTHER COMMENTS:    **Let patient know to contact pharmacy at the end of the day to make sure medication is ready. **  ** Please notify patient to allow 48-72 hours to process**  **Encourage patient to contact the pharmacy for refills or they can request refills through [facility]**

## 2019-04-24 NOTE — Telephone Encounter (Signed)
Rx sent 

## 2019-05-18 ENCOUNTER — Ambulatory Visit (INDEPENDENT_AMBULATORY_CARE_PROVIDER_SITE_OTHER): Payer: Medicare HMO | Admitting: Cardiology

## 2019-05-18 ENCOUNTER — Encounter: Payer: Self-pay | Admitting: Cardiology

## 2019-05-18 ENCOUNTER — Other Ambulatory Visit: Payer: Self-pay

## 2019-05-18 VITALS — BP 140/68 | HR 70 | Ht 67.5 in | Wt 222.6 lb

## 2019-05-18 DIAGNOSIS — I2510 Atherosclerotic heart disease of native coronary artery without angina pectoris: Secondary | ICD-10-CM

## 2019-05-18 DIAGNOSIS — E782 Mixed hyperlipidemia: Secondary | ICD-10-CM | POA: Diagnosis not present

## 2019-05-18 DIAGNOSIS — I779 Disorder of arteries and arterioles, unspecified: Secondary | ICD-10-CM

## 2019-05-18 DIAGNOSIS — I2583 Coronary atherosclerosis due to lipid rich plaque: Secondary | ICD-10-CM | POA: Diagnosis not present

## 2019-05-18 DIAGNOSIS — E1151 Type 2 diabetes mellitus with diabetic peripheral angiopathy without gangrene: Secondary | ICD-10-CM

## 2019-05-18 DIAGNOSIS — IMO0002 Reserved for concepts with insufficient information to code with codable children: Secondary | ICD-10-CM

## 2019-05-18 DIAGNOSIS — E1165 Type 2 diabetes mellitus with hyperglycemia: Secondary | ICD-10-CM

## 2019-05-18 DIAGNOSIS — Z23 Encounter for immunization: Secondary | ICD-10-CM

## 2019-05-18 NOTE — Patient Instructions (Signed)
Medication Instructions:  The current medical regimen is effective;  continue present plan and medications.  You may try over the counter Pepcid.  *If you need a refill on your cardiac medications before your next appointment, please call your pharmacy*  Follow-Up: At [facility], you and your health needs are our priority.  As part of our continuing mission to provide you with exceptional heart care, we have created designated Provider Care Teams.  These Care Teams include your primary Cardiologist (physician) and Advanced Practice Providers (APPs -  Physician Assistants and Nurse Practitioners) who all work together to provide you with the care you need, when you need it.  Your next appointment:   12 months  The format for your next appointment:   In Person  Provider:   You may see Dr Candee Furbish. or one of the following Advanced Practice Providers on your designated Care Team:    Truitt Merle, NP  Cecilie Kicks, NP  Kathyrn Drown, NP   Thank you for choosing [facility]!!

## 2019-05-18 NOTE — Progress Notes (Signed)
Cardiology Office Note   Date:  05/18/2019   ID:  [name] [name], DOB 01-09-1951, MRN [id number]  PCP:  [name] Corn, MD  Cardiologist:  [name] Schultz, MD  Endocrine: Dr. Lucianne [name]      History of Present Illness: [name] [name] is a 68 y.o. male former patient of Dr. Myrtis [name] who presents today to follow-up CAD/CABG artery disease and hyperlipidemia, former PCI to RCA  prior to his bypass surgery.    He tells me that he previously had nighttime leg cramps at first. This improved over time. He is stable now. His follow-up labs reveal an LDL of 56. TSH was normal. Unfortunately his triglycerides are high. I'm sure that goes along with his diabetes. He has been seeing Dr. Lucianne [name], endocrinology.  This is improved since improving his diabetes.  Triglycerides 544 on 04/15/15, hemoglobin A1c 7.5.  Triglycerides are  280 on 06/15/17.  Invokana.  Never had symptoms prior to CABG. Dr. Alwyn [name] did ETT. Abnormal. First RCA stent in 2004 then CABG 2012.   I saw father as well who has passed. His father has had carotid endarterectomy.  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 Takes trips to the [place] occasionally travel.  12/02/16-overall doing quite well, no chest pain, no shortness of breath, no syncope, no bleeding. Only problems, hard to work and take meds for DM. Dissatisfied personally with it. Feeling aches and pain. One time when pulling his fast boat in he feels like he strained his chest wall. He forgets that he has had bypass surgery. This dissipated after 2 or 3 days. He denies any syncope, chest pain, shortness of breath. Taking his medications well.  07/01/17- he had an experience when he was walking uphill for about 1-1/2 miles after a [employer] where he had to slow down because of shortness of breath.  Usually he is quite active and has no trouble before or since then.  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  He has been grieving the loss of his father who was a patient of mine.  01/24/18 -overall been doing quite well.  Once again relayed to me that story at the [place] football game when he walked up hill and felt short of breath and had to slow down.  He is not having any anginal symptoms otherwise.  Taking his medications.  Sometimes has low blood pressure with his diabetic medication and he takes his ramipril every other day because of this.  No syncope bleeding orthopnea PND.  Hemoglobin A1c 6.8, triglycerides 280, LDL 42, ALT 43  05/18/2019 - CABG follow up. Dental work, pulled teeth, implants. At cafe, rice and chicken, eating it. Could not swallow or thorw up. Conclusion, not chewing rice, just swallowing. Having some GERD or heart burn. Since then he is doing better. Trying to chew better.  Overall no fevers chills syncope bleeding  Past Medical History:  Diagnosis Date  . Aortic valve sclerosis    echo june 2009  . CAD (coronary artery disease)    a. 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 b. s/p CABG 04/2011.  . Carotid artery disease (HCC)    Doppler, preop, September, 2012,  mild bilateral plaque with no significant stenoses  . Diabetes mellitus   . Dizziness    June, 2013  . Ejection fraction    a. Ejection fraction 60%, echo,  June, 2009. b. EF preserved by cath 2012.  [name] Food poisoning    age 72  . GERD (gastroesophageal reflux disease)   . Hx of CABG    [name] [name],  x4  May 01, 2011 limited to the LAD, left radial artery to the OM, SVG to diagonal, SVG to posterior descending  . Hyperlipidemia   . Hypertension   . Muscle ache    on Crestor  09/04/09 CPK 193(7-232) tolerated simvastatin in the past  . PVC (premature ventricular contraction) 06/2009   November, 2010  . Rash     Past Surgical History:  Procedure Laterality Date  . BACK SURGERY    . coccyx bone removed    . CORONARY ARTERY BYPASS GRAFT  05/01/2011    CABG x4 with LIMA to LAD, LRA to OM, SVG to D1, SVG to PDA, EVH via left thigh  . Intraoperative transesophageal echocardiography.  05/01/2011  . pilonidal cystectomy      Patient Active Problem List   Diagnosis Date Noted  . Dizziness   . Carotid artery disease ([place])   . Hx of CABG   . CAD (coronary artery disease)   . Hyperlipidemia   . Aortic valve sclerosis   . GERD (gastroesophageal reflux disease)   . Muscle ache   . Ejection fraction   . KNEE PAIN, LEFT, CHRONIC 05/21/2010  . PVC (premature ventricular contraction) 06/03/2009  . Carotid bruit 06/03/2009  . Type II diabetes mellitus with peripheral circulatory disorder, uncontrolled ([place]) 03/27/2008  . Essential hypertension 03/27/2008      Current Outpatient Medications  Medication Sig Dispense Refill  . aspirin 81 MG tablet Take 81 mg by mouth daily.      [name] atorvastatin (LIPITOR) 20 MG tablet TAKE 1 TABLET(20 MG) BY MOUTH DAILY 90 tablet 0  . FARXIGA 10 MG TABS tablet TAKE 1 TABLET BY MOUTH EVERY DAY 30 tablet 3  . fenofibrate micronized (LOFIBRA) 200 MG capsule TAKE 1 CAPSULE BY MOUTH EVERY DAY 90 capsule 1  . glucose blood (ONETOUCH VERIO) test strip Use as instructed to test sugars once daily 100 each 2  . glucose blood test strip USE TO TEST BLOOD SUGAR TWICE DAILY DX CODE E11.65 100 each 6  . Insulin Aspart FlexPen 100 UNIT/ML SOPN INJECT 12 TO 14 UNITS UNDER THE SKIN BEFORE SUPPER 5 pen 1  . Insulin Degludec (TRESIBA FLEXTOUCH) 200 UNIT/ML SOPN Inject 136 Units into the skin daily. INJECT 136 UNITS UNDER THE SKIN ONCE DAILY. 21 pen 3  . metFORMIN (GLUCOPHAGE-XR) 500 MG 24 hr tablet TAKE 4 TABLETS BY MOUTH DAILY WITH SUPPER 360 tablet 1  . NOVOFINE 32G X 6 MM MISC USE TO INJECT INSULIN 2 TIMES PER DAY. 100 each 3  . Omega 3 1000 MG CAPS Take 1,000 mg by mouth daily. TAKE 1 TABLET BY MOUTH ONCE DAILY.    [name] [name] Delica Lancets 24P MISC 1 each by Does not apply route daily. Use onetouch delica lancets to check  blood sugar once daily. 100 each 2  . ramipril (ALTACE) 2.5 MG capsule TAKE 1 CAPSULE BY MOUTH EVERY DAY 90 capsule 0  . VICTOZA 18 MG/3ML SOPN INJECT 1.8 MG ONCE DAILY AT THE SAME TIME EACH DAY. 9 pen 1   No current facility-administered medications for this visit.     Allergies:   Rosuvastatin    Social History:  The patient  reports that he has never smoked. He has never used smokeless tobacco. He reports that he does  not drink alcohol or use drugs.   Family History:  The patient's family history includes Cancer in his maternal grandfather; Diabetes in his father, mother, paternal aunt, and paternal uncle; Hearing loss in his father and paternal grandfather; Heart attack in his paternal grandfather; Heart attack (age of onset: 82) in his father; Heart failure in his maternal grandmother; Hypertension in his father; Prostate cancer in his maternal grandfather; Thyroid disease in his father.    ROS:  Please see the history of present illness.    All other ROS  PHYSICAL EXAM: VS:  BP 140/68   Pulse 70   Ht 5' 7.5" (1.715 m)   Wt 222 lb 9.6 oz (101 kg)   SpO2 95%   BMI 34.35 kg/m  , GEN: Well nourished, well developed, in no acute distress  HEENT: normal  Neck: no JVD, carotid bruits, or masses Cardiac: RRR; soft systolic murmur, rubs, or gallops,no edema  Respiratory:  clear to auscultation bilaterally, normal work of breathing GI: soft, nontender, nondistended, + BS MS: no deformity or atrophy  Skin: warm and dry, no rash Neuro:  Alert and Oriented x 3, Strength and sensation are intact Psych: euthymic mood, full affect     EKG:   EKG ordered today 05/18/2019-sinus rhythm with PACs left axis deviation nonspecific T wave changes 70 bpm.  01/24/18 - NSR NSSTW changes. Personally viewed. 12/02/16-sinus rhythm 77 with nonspecific T-wave changes, subtle T-wave inversion in V6, QT interval 412 ms. Personally viewed. 06/12/15-sinus rhythm, 68, nonspecific ST-T wave changes, no other  abnormalities. Poor R wave progression.  Echocardiogram: 01/24/2008  - Normal ejection fraction  - Mild aortic regurgitation  - Mild mitral regurgitation  Carotid ultrasound 04/28/2011  - Mild bilateral plaque  Recent Labs: 03/01/2019: ALT 27; BUN 10; Creatinine, [name] 0.71; Potassium 4.0; Sodium 141    Lipid Panel    Component Value Date/Time   CHOL 115 03/01/2019 1451   TRIG 216.0 (H) 03/01/2019 1451   HDL 33.00 (L) 03/01/2019 1451   CHOLHDL 3 03/01/2019 1451   VLDL 43.2 (H) 03/01/2019 1451   LDLCALC 64 04/25/2014 0957   LDLDIRECT 60.0 03/01/2019 1451      Wt Readings from Last 3 Encounters:  05/18/19 222 lb 9.6 oz (101 kg)  03/01/19 218 lb (98.9 kg)  11/28/18 218 lb 3.2 oz (99 kg)      Current medicines are reviewed  The patient understands his medications. Reviewed prior office notes, labs, ECG     ASSESSMENT AND PLAN:  Coronary artery disease/status post CABG  -  Overall doing quite well.  He did have an episode when he was walking uphill at [place] last year where he felt some shortness of breath which is unusual for him.  He told me about this again.  No chest pain at that time.  He has not felt anything since.  I told him that if he begins to feel this way again to let us know and we will have low threshold for stress test.  Overall continue with aggressive secondary prevention.  He also has been describing some GERD-like symptoms.  He is having some trouble chewing his food and sometimes swallows his rice whole.  He feels like the sensation is heartburn.  I suggested he may try some Pepcid.  If the symptoms become more worrisome however once again, low threshold for stress test.  Hyperlipidemia  - Dr. Myrtis [name] change statin, 20 mg of atorvastatin.  Continues to take this.  Overall doing quite well.  No myalgias.   His triglycerides have improved. His triglycerides were 505 on 10/27/16. Now on fenofibrate.  Triglycerides 216 direct LDL was 60 however. Hemoglobin A1c 7.7.  He understands.  Diabetes with peripheral vascular disease/carotid artery plaque  - [name] has been working with him. - BP low at times.  He is on very low-dose Altace 2.5 mg a day.  - Mild plaque.  Statin  Carotid artery plaque  - Mild plaque noted on ultrasound. Continue with aggressive secondary prevention. He was concerned because his father had a severe carotid artery stenosis that required carotid endarterectomy in his late 280s.  - No changes. Statin. DM control. No changes.  Obesity  - weight loss discussed. COVID pandemic has deterred weight loss.  Flu shot today Follow up 1 year.  [name] SchultzMark Aariz Maish, MD

## 2019-05-20 ENCOUNTER — Other Ambulatory Visit: Payer: Self-pay | Admitting: Endocrinology

## 2019-06-01 ENCOUNTER — Ambulatory Visit: Payer: Medicare HMO | Admitting: Endocrinology

## 2019-06-05 ENCOUNTER — Other Ambulatory Visit: Payer: Self-pay | Admitting: Cardiology

## 2019-06-20 DIAGNOSIS — E119 Type 2 diabetes mellitus without complications: Secondary | ICD-10-CM | POA: Diagnosis not present

## 2019-06-20 DIAGNOSIS — Z125 Encounter for screening for malignant neoplasm of prostate: Secondary | ICD-10-CM | POA: Diagnosis not present

## 2019-06-20 DIAGNOSIS — I10 Essential (primary) hypertension: Secondary | ICD-10-CM | POA: Diagnosis not present

## 2019-06-24 ENCOUNTER — Other Ambulatory Visit: Payer: Self-pay | Admitting: Endocrinology

## 2019-06-27 DIAGNOSIS — E785 Hyperlipidemia, unspecified: Secondary | ICD-10-CM | POA: Diagnosis not present

## 2019-06-27 DIAGNOSIS — E119 Type 2 diabetes mellitus without complications: Secondary | ICD-10-CM | POA: Diagnosis not present

## 2019-06-27 DIAGNOSIS — Z794 Long term (current) use of insulin: Secondary | ICD-10-CM | POA: Diagnosis not present

## 2019-06-27 DIAGNOSIS — E669 Obesity, unspecified: Secondary | ICD-10-CM | POA: Diagnosis not present

## 2019-06-27 DIAGNOSIS — E042 Nontoxic multinodular goiter: Secondary | ICD-10-CM | POA: Diagnosis not present

## 2019-06-27 DIAGNOSIS — I6529 Occlusion and stenosis of unspecified carotid artery: Secondary | ICD-10-CM | POA: Diagnosis not present

## 2019-06-27 DIAGNOSIS — R82998 Other abnormal findings in urine: Secondary | ICD-10-CM | POA: Diagnosis not present

## 2019-06-27 DIAGNOSIS — H269 Unspecified cataract: Secondary | ICD-10-CM | POA: Diagnosis not present

## 2019-06-27 DIAGNOSIS — M545 Low back pain: Secondary | ICD-10-CM | POA: Diagnosis not present

## 2019-06-27 DIAGNOSIS — I25810 Atherosclerosis of coronary artery bypass graft(s) without angina pectoris: Secondary | ICD-10-CM | POA: Diagnosis not present

## 2019-06-27 DIAGNOSIS — Z0000 Encounter for general adult medical examination without abnormal findings: Secondary | ICD-10-CM | POA: Diagnosis not present

## 2019-07-03 ENCOUNTER — Encounter: Payer: Self-pay | Admitting: Endocrinology

## 2019-07-03 ENCOUNTER — Other Ambulatory Visit: Payer: Self-pay

## 2019-07-03 ENCOUNTER — Ambulatory Visit (INDEPENDENT_AMBULATORY_CARE_PROVIDER_SITE_OTHER): Payer: Medicare HMO | Admitting: Endocrinology

## 2019-07-03 DIAGNOSIS — E1165 Type 2 diabetes mellitus with hyperglycemia: Secondary | ICD-10-CM

## 2019-07-03 DIAGNOSIS — Z794 Long term (current) use of insulin: Secondary | ICD-10-CM

## 2019-07-03 NOTE — Progress Notes (Signed)
Patient ID: [name] [name], male   DOB: November 20, 1950, 68 y.o.   MRN: [id number]           Reason for Appointment: Follow-up for Type 2 Diabetes  Today's office visit was provided via telemedicine using video technique The patient was explained the limitations of evaluation and management by telemedicine and the availability of in person appointments.  The patient understood the limitations and agreed to proceed. Patient also understood that the telehealth visit is billable.  Location of the patient: Patient's home  Location of the provider: Physician office Only the patient and myself were participating in the encounter     History of Present Illness:          Date of diagnosis of type 2 diabetes mellitus: 2011        Background history:  He was probably started on metformin at diagnosis and subsequently given Amaryl also Subsequently had been on Janumet and more recently this was changed to [facility]. Because of poor control he was given Levemir in addition to his Kombiglyze in 01/2013 The dose was progressively increased from initial dose of 12 units He did have somewhat better control right after starting insulin with A1c below 7% but only one time His blood sugars had been consistently high since 2014 with A1c at least 8%  Recent history:   INSULIN regimen is described as: [name] 136 units daily.  Novolog at suppertime 12-16  units  Non-insulin hypoglycemic drugs the patient is taking are: Farxiga 5 mg daily, Metformin ER 2 g, Victoza 1.8 mg daily   His A1c is slightly better at 7.5, previously he was at 7.7. A1c has been as low as 6.8  Current blood sugar patterns, management and problems identified:  He is again checking blood sugars mostly fasting with no readings after meals available recently  He now says that he is trying to take some NovoLog at lunch more regularly but only 6 to 8 units  Also he is only taking the same amount at dinnertime of NovoLog even  though he was taking up to 16 units before; he says he will take 12 to 14 units NovoLog at dinner if he has not taken any at lunchtime  He may have done a few readings after dinner in the last couple of months and these are usually over 200  He was told to increase his [name] but he did not do so after his last visit in July  He has had some knee problems and cannot do any exercise  However his weight is about the same  He has had difficulty tolerating 10 mg Farxiga because of frequent urination and is only taking half a tablet in the morning, this still causes some frequent urination in the mornings  His FASTING blood sugars are at least 140 and he is not attempting to adjust his insulin accordingly  His weight with his PCP was 218 Unable to assess his readings after breakfast or lunch  Side effects from medications have been: ?  Dizziness from Invokana  Hypoglycemia: None    Glucose monitoring:  done less than once  in a day         Glucometer: One Touch Verio    Blood Glucose readings from monitor review:    PRE-MEAL Fasting Lunch Dinner Bedtime Overall  Glucose range:  142-224      Mean/median:     179    previous readings:  PRE-MEAL Fasting Lunch Dinner Bedtime Overall  Glucose  range: 133-198      Mean/median:     178   POST-MEAL PC Breakfast PC Lunch PC Dinner  Glucose range:    316, 275  Mean/median:         Self-care: The diet that the patient has been following is: tries to limit sweet drinks.      Meal times: Breakfast: 9 AM Lunch: 1 PM Dinner: 6 PM   Typical meal intake:Lunch is a sandwich or vegetables, variable meal at suppertime.                Dietician visit, most recent: 6/16  Weight history: Highest  previously 256  Wt Readings from Last 3 Encounters:  05/18/19 222 lb 9.6 oz (101 kg)  03/01/19 218 lb (98.9 kg)  11/28/18 218 lb 3.2 oz (99 kg)    Glycemic control:   Lab Results  Component Value Date   HGBA1C 7.7 (A) 03/01/2019   HGBA1C  7.5 (A) 11/28/2018   HGBA1C 7.4 (A) 08/29/2018   Lab Results  Component Value Date   MICROALBUR <0.7 03/01/2019   LDLCALC 64 04/25/2014   CREATININE 0.71 03/01/2019    Other active problems: See review of systems    Allergies as of 07/03/2019      Reactions   Rosuvastatin Other (See Comments)   REACTION: myalgias      Medication List       Accurate as of July 03, 2019  4:18 PM. If you have any questions, ask your nurse or doctor.        aspirin 81 MG tablet Take 81 mg by mouth daily.   atorvastatin 20 MG tablet Commonly known as: LIPITOR TAKE 1 TABLET(20 MG) BY MOUTH DAILY   Farxiga 10 MG Tabs tablet Generic drug: dapagliflozin propanediol TAKE 1 TABLET BY MOUTH EVERY DAY What changed:   how much to take  additional instructions   fenofibrate micronized 200 MG capsule Commonly known as: LOFIBRA TAKE 1 CAPSULE BY MOUTH EVERY DAY   glucose blood test strip USE TO TEST BLOOD SUGAR TWICE DAILY DX CODE E11.65   OneTouch Verio test strip Generic drug: glucose blood Use as instructed to test sugars once daily   Insulin Aspart FlexPen 100 UNIT/ML Sopn INJECT 12 TO 14 UNITS UNDER THE SKIN BEFORE SUPPER   Insulin Degludec 200 UNIT/ML Sopn Commonly known as: [place] FlexTouch Inject 136 Units into the skin daily. INJECT 136 UNITS UNDER THE SKIN ONCE DAILY.   metFORMIN 500 MG 24 hr tablet Commonly known as: GLUCOPHAGE-XR TAKE 4 TABLETS BY MOUTH DAILY WITH SUPPER   NovoFine 32G X 6 MM Misc Generic drug: Insulin Pen Needle USE TO INJECT INSULIN 2 TIMES PER DAY.   Omega 3 1000 MG Caps Take 1,000 mg by mouth daily. TAKE 1 TABLET BY MOUTH ONCE DAILY.   OneTouch Delica Lancets 50N Misc 1 each by Does not apply route daily. Use onetouch delica lancets to check blood sugar once daily.   ramipril 2.5 MG capsule Commonly known as: ALTACE TAKE 1 CAPSULE BY MOUTH EVERY DAY   Victoza 18 MG/3ML Sopn Generic drug: liraglutide INJECT 1.8 MG ONCE DAILY AT THE  SAME TIME EACH DAY.       Allergies:  Allergies  Allergen Reactions   Rosuvastatin Other (See Comments)    REACTION: myalgias    Past Medical History:  Diagnosis Date   Aortic valve sclerosis    echo june 2009   CAD (coronary artery disease)    a. s/p DES  to distal RCA (anomalous origin, arises from left coronary cusp) 2004. b. s/p CABG 04/2011.   Carotid artery disease (HCC)    Doppler, preop, September, 2012,  mild bilateral plaque with no significant stenoses   Diabetes mellitus    Dizziness    June, 2013   Ejection fraction    a. Ejection fraction 60%, echo, June, 2009. b. EF preserved by cath 2012.   Food poisoning    age 79   GERD (gastroesophageal reflux disease)    Hx of CABG    [name],  x4  May 01, 2011 limited to the LAD, left radial artery to the OM, SVG to diagonal, SVG to posterior descending   Hyperlipidemia    Hypertension    Muscle ache    on Crestor  09/04/09 CPK 193(7-232) tolerated simvastatin in the past   PVC (premature ventricular contraction) 06/2009   November, 2010   Rash     Past Surgical History:  Procedure Laterality Date   BACK SURGERY     coccyx bone removed     CORONARY ARTERY BYPASS GRAFT  05/01/2011   CABG x4 with LIMA to LAD, LRA to OM, SVG to D1, SVG to PDA, EVH via left thigh   Intraoperative transesophageal echocardiography.  05/01/2011   pilonidal cystectomy      Family History  Problem Relation Age of Onset   Heart attack Paternal Grandfather        late 66s   Hearing loss Paternal Grandfather    Hypertension Father    Heart attack Father 57       bypass   Diabetes Father    Hearing loss Father    Thyroid disease Father    Cancer Maternal Grandfather        site unknown   Prostate cancer Maternal Grandfather    Diabetes Paternal Aunt        X 3   Heart failure Maternal Grandmother    Diabetes Mother    Diabetes Paternal Uncle    Stroke Neg Hx     Social History:  reports  that he has never smoked. He has never used smokeless tobacco. He reports that he does not drink alcohol or use drugs.    Review of Systems   Lipid history: He has been taking fish oil for significantly high triglycerides along with his Lipitor  Triglycerides have been mostly high; his insurance will not cover Vascepa or Lovaza Currently on fenofibrate and OTC omega-3 fish oil  Last triglycerides 176 with LDL 59 done in 06/2019 from PCP    Lab Results  Component Value Date   CHOL 115 03/01/2019   HDL 33.00 (L) 03/01/2019   LDLCALC 64 04/25/2014   LDLDIRECT 60.0 03/01/2019   TRIG 216.0 (H) 03/01/2019   CHOLHDL 3 03/01/2019            THYROID: He has a 2.4 cm dominant left-sided thyroid nodule on Ultrasound in 12/2014  Follow-up showed the same size of the nodules  TSH was 1.6 done by PCP in 11/19  HYPERTENSION:  on  2.5 mg ramipril  Periodically checking at pharmacy and blood pressure is usually not high  Blood pressure with PCP was 138/74  BP Readings from Last 3 Encounters:  05/18/19 140/68  03/01/19 (!) 148/92  11/28/18 110/70     LABS:  No visits with results within 1 Week(s) from this visit.  Latest known visit with results is:  Office Visit on 03/01/2019  Component Date Value Ref Range  Status   Hemoglobin A1C 03/01/2019 7.7* 4.0 - 5.6 % Final   Cholesterol 03/01/2019 115  0 - 200 mg/dL Final   ATP III Classification       Desirable:  < 200 mg/dL               Borderline High:  200 - 239 mg/dL          High:  > = 161 mg/dL   Triglycerides 09/60/4540 216.0* 0.0 - 149.0 mg/dL Final   Normal:  <981 mg/dLBorderline High:  150 - 199 mg/dL   HDL 19/14/7829 56.21* >39.00 mg/dL Final   VLDL 30/86/5784 43.2* 0.0 - 40.0 mg/dL Final   Total CHOL/HDL Ratio 03/01/2019 3   Final                  Men          Women1/2 Average Risk     3.4          3.3Average Risk          5.0          4.42X Average Risk          9.6          7.13X Average Risk          15.0           11.0                       NonHDL 03/01/2019 81.68   Final   NOTE:  Non-HDL goal should be 30 mg/dL higher than patient's LDL goal (i.e. LDL goal of < 70 mg/dL, would have non-HDL goal of < 100 mg/dL)   Sodium 69/62/9528 413  135 - 145 mEq/L Final   Potassium 03/01/2019 4.0  3.5 - 5.1 mEq/L Final   Chloride 03/01/2019 106  96 - 112 mEq/L Final   CO2 03/01/2019 28  19 - 32 mEq/L Final   Glucose, Bld 03/01/2019 136* 70 - 99 mg/dL Final   BUN 24/40/1027 10  6 - 23 mg/dL Final   Creatinine, Ser 03/01/2019 0.71  0.40 - 1.50 mg/dL Final   Total Bilirubin 03/01/2019 0.6  0.2 - 1.2 mg/dL Final   Alkaline Phosphatase 03/01/2019 32* 39 - 117 U/L Final   AST 03/01/2019 19  0 - 37 U/L Final   ALT 03/01/2019 27  0 - 53 U/L Final   Total Protein 03/01/2019 7.1  6.0 - 8.3 g/dL Final   Albumin 25/36/6440 4.7  3.5 - 5.2 g/dL Final   Calcium 34/74/2595 9.6  8.4 - 10.5 mg/dL Final   GFR 63/87/5643 110.77  >60.00 mL/min Final   Microalb, Ur 03/01/2019 <0.7  0.0 - 1.9 mg/dL Final   Creatinine,U 32/95/1884 77.1  mg/dL Final   Microalb Creat Ratio 03/01/2019 0.9  0.0 - 30.0 mg/g Final   Direct LDL 03/01/2019 60.0  mg/dL Final   Optimal:  <166 mg/dLNear or Above Optimal:  100-129 mg/dLBorderline High:  130-159 mg/dLHigh:  160-189 mg/dLVery High:  >190 mg/dL    Physical Examination:  There were no vitals taken for this visit.    ASSESSMENT:  Diabetes type 2 on insulin    See history of present illness for detailed discussion of  current management, blood sugar patterns and problems identified  His A1c is still relatively high at 7.5  Also his home blood sugars appear to be higher than expected with his morning sugars averaging nearly 180 Also even  though he does not monitor it much he likely has high readings after his evening meal at least He is interested in weight loss but currently unable to exercise Discussed again that he is taking large amounts of basal insulin and his  NovoLog dose is relatively small and likely to cover his meals Also discussed that the evening dose is independent of the lunchtime coverage and this insulin only last for 4 hours  He can benefit from higher doses of Farxiga but he apparently gets excessive polyuria with the 10 mg dose  HYPERTENSION: Blood pressure is appearing well controlled recently and followed by PCP also  On Farxiga and low-dose ramipril He will need to check his blood pressure regularly and let us know if it is persistently high    LIPIDS: Followed by PCP, recent triglycerides somewhat better  PLAN:  He will take 144 units of Tresiba for now Average dose of NovoLog at dinnertime should be 18-20 units regardless of whether he takes insulin at lunch or not Also explained that he needs to be checking his sugars regularly after dinner to help assess the doses of his NovoLog at dinnertime He should check blood sugars at different times by rotation and does not need to check morning readings daily Also he can try taking additional half tablet of Farxiga before dinner which may help with his evening readings and possibly fasting range With this he may be able to reduce his insulin dose eventually and lose weight Resume walking or other exercise when he is able to Again discussed that he can probably use the freestyle libre system for CGM but he needs to monitor blood sugars 4 times a day to be eligible for this, he will also check with his insurance about the coverage  There are no Patient Instructions on file for this visit.   Counseling time on subjects discussed in assessment and plan sections is over 50% of today's 25 minute visit   Reather LittlerAjay Chaniyah Jahr 07/03/2019, 4:18 PM   Note: This office note was prepared with Dragon voice recognition system technology. Any transcriptional errors that result from this process are unintentional.

## 2019-07-18 ENCOUNTER — Other Ambulatory Visit: Payer: Self-pay | Admitting: Endocrinology

## 2019-07-18 DIAGNOSIS — R69 Illness, unspecified: Secondary | ICD-10-CM | POA: Diagnosis not present

## 2019-07-26 DIAGNOSIS — Z1212 Encounter for screening for malignant neoplasm of rectum: Secondary | ICD-10-CM | POA: Diagnosis not present

## 2019-07-29 ENCOUNTER — Other Ambulatory Visit: Payer: Self-pay | Admitting: Endocrinology

## 2019-08-09 DIAGNOSIS — E119 Type 2 diabetes mellitus without complications: Secondary | ICD-10-CM | POA: Diagnosis not present

## 2019-08-09 DIAGNOSIS — H2513 Age-related nuclear cataract, bilateral: Secondary | ICD-10-CM | POA: Diagnosis not present

## 2019-08-09 LAB — HM DIABETES EYE EXAM

## 2019-08-15 ENCOUNTER — Other Ambulatory Visit: Payer: Self-pay | Admitting: Endocrinology

## 2019-08-29 ENCOUNTER — Other Ambulatory Visit: Payer: Self-pay | Admitting: Endocrinology

## 2019-11-13 ENCOUNTER — Other Ambulatory Visit: Payer: Self-pay | Admitting: Endocrinology

## 2019-11-17 ENCOUNTER — Other Ambulatory Visit: Payer: Self-pay | Admitting: Endocrinology

## 2019-12-18 ENCOUNTER — Other Ambulatory Visit: Payer: Self-pay | Admitting: Endocrinology

## 2019-12-18 NOTE — Telephone Encounter (Signed)
Give only 30-day prescription with note to make appointment.  Would prefer that he make an appointment for follow-up also.  Can do same day labs if coming from out of town

## 2019-12-18 NOTE — Telephone Encounter (Signed)
Please review and advise.

## 2019-12-18 NOTE — Telephone Encounter (Signed)
Patient schedule F/U appointment and RX sent for 30 day supply only to pharmacy.

## 2019-12-25 ENCOUNTER — Other Ambulatory Visit: Payer: Self-pay

## 2019-12-27 ENCOUNTER — Other Ambulatory Visit: Payer: Self-pay | Admitting: Endocrinology

## 2019-12-27 ENCOUNTER — Other Ambulatory Visit: Payer: Self-pay

## 2019-12-27 ENCOUNTER — Ambulatory Visit: Payer: Medicare HMO | Admitting: Endocrinology

## 2019-12-27 ENCOUNTER — Other Ambulatory Visit (INDEPENDENT_AMBULATORY_CARE_PROVIDER_SITE_OTHER): Payer: Medicare HMO

## 2019-12-27 DIAGNOSIS — E1165 Type 2 diabetes mellitus with hyperglycemia: Secondary | ICD-10-CM | POA: Diagnosis not present

## 2019-12-27 DIAGNOSIS — Z794 Long term (current) use of insulin: Secondary | ICD-10-CM

## 2019-12-27 DIAGNOSIS — E782 Mixed hyperlipidemia: Secondary | ICD-10-CM

## 2019-12-27 LAB — LIPID PANEL
Cholesterol: 117 mg/dL (ref 0–200)
HDL: 29.6 mg/dL — ABNORMAL LOW (ref >39.00)
NonHDL: 87.82
Total CHOL/HDL Ratio: 4
Triglycerides: 208 mg/dL — ABNORMAL HIGH (ref 0.0–149.0)
VLDL: 41.6 mg/dL — ABNORMAL HIGH (ref 0.0–40.0)

## 2019-12-27 LAB — COMPREHENSIVE METABOLIC PANEL
ALT: 27 U/L (ref 0–53)
AST: 20 U/L (ref 0–37)
Albumin: 4.4 g/dL (ref 3.5–5.2)
Alkaline Phosphatase: 36 U/L — ABNORMAL LOW (ref 39–117)
BUN: 14 mg/dL (ref 6–23)
CO2: 26 mEq/L (ref 19–32)
Calcium: 8.8 mg/dL (ref 8.4–10.5)
Chloride: 104 mEq/L (ref 96–112)
Creatinine, Ser: 0.67 mg/dL (ref 0.40–1.50)
GFR: 118.14 mL/min (ref >60.00)
Glucose, Bld: 206 mg/dL — ABNORMAL HIGH (ref 70–99)
Potassium: 4.1 mEq/L (ref 3.5–5.1)
Sodium: 137 mEq/L (ref 135–145)
Total Bilirubin: 0.5 mg/dL (ref 0.2–1.2)
Total Protein: 6.6 g/dL (ref 6.0–8.3)

## 2019-12-27 LAB — HEMOGLOBIN A1C: Hgb A1c MFr Bld: 8.5 % — ABNORMAL HIGH (ref 4.6–6.5)

## 2019-12-27 LAB — LDL CHOLESTEROL, DIRECT: Direct LDL: 63 mg/dL

## 2019-12-27 LAB — MICROALBUMIN / CREATININE URINE RATIO
Creatinine,U: 167.3 mg/dL
Microalb Creat Ratio: 1.2 mg/g (ref 0.0–30.0)
Microalb, Ur: 2 mg/dL — ABNORMAL HIGH (ref 0.0–1.9)

## 2019-12-29 ENCOUNTER — Other Ambulatory Visit: Payer: Self-pay

## 2020-01-03 ENCOUNTER — Other Ambulatory Visit: Payer: Self-pay

## 2020-01-03 ENCOUNTER — Encounter: Payer: Self-pay | Admitting: Endocrinology

## 2020-01-03 ENCOUNTER — Ambulatory Visit: Payer: Medicare HMO | Admitting: Endocrinology

## 2020-01-03 VITALS — BP 144/82 | HR 75 | Ht 67.5 in | Wt 230.6 lb

## 2020-01-03 DIAGNOSIS — E1165 Type 2 diabetes mellitus with hyperglycemia: Secondary | ICD-10-CM

## 2020-01-03 DIAGNOSIS — Z794 Long term (current) use of insulin: Secondary | ICD-10-CM | POA: Diagnosis not present

## 2020-01-03 DIAGNOSIS — I10 Essential (primary) hypertension: Secondary | ICD-10-CM | POA: Diagnosis not present

## 2020-01-03 DIAGNOSIS — E782 Mixed hyperlipidemia: Secondary | ICD-10-CM

## 2020-01-03 MED ORDER — HUMULIN R U-500 KWIKPEN 500 UNIT/ML ~~LOC~~ SOPN
PEN_INJECTOR | SUBCUTANEOUS | 0 refills | Status: DC
Start: 2020-01-03 — End: 2020-01-05

## 2020-01-03 NOTE — Progress Notes (Signed)
Patient ID: [name] [name], male   DOB: 1951/06/28, 69 y.o.   MRN: [id number]           Reason for Appointment: Follow-up for Type 2 Diabetes    History of Present Illness:          Date of diagnosis of type 2 diabetes mellitus: 2011        Background history:  He was probably started on metformin at diagnosis and subsequently given Amaryl also Subsequently had been on Janumet and more recently this was changed to [facility]. Because of poor control he was given Levemir in addition to his Kombiglyze in 01/2013 The dose was progressively increased from initial dose of 12 units He did have somewhat better control right after starting insulin with A1c below 7% but only one time His blood sugars had been consistently high since 2014 with A1c at least 8%  Recent history:   INSULIN regimen is described as: [name] 144 units daily.  Novolog 10 acl and suppertime 14-20  units  Non-insulin hypoglycemic drugs the patient is taking are: [name]: Not taking, Metformin ER 2 g, Victoza 1.8 mg daily   His A1c is higher than usual at 8.5 compared to 7. 7  A1c has been as low as 6.8  Current blood sugar patterns, management and problems identified:  He has not been seen since 11/20  Because of frequent urination he has not taken [name] for couple of months, he felt that since his urination was better when he ran out of the medication he did not refill it  Only recently has started checking sugars after meals more consistently and previously was only doing fasting readings usually  He does have some high-fat meals especially at breakfast otherwise he thinks he is eating relatively balanced low carbohydrate meals  His weight has gone up 12 pounds  Recently because of back pain has not been active at all  FASTING readings are also fairly high and about the same    Side effects from medications have been: ?  Dizziness from Invokana  Hypoglycemia: None    Glucose monitoring:  done  less than once  in a day         Glucometer: One Touch Verio    Blood Glucose readings from monitor with averages:    PRE-MEAL Fasting Lunch Dinner Bedtime Overall  Glucose range: 158-214      Mean/median:  193    220   POST-MEAL PC Breakfast PC Lunch PC Dinner  Glucose range:   223, 253  195-305  Mean/median:    250    Previous readings  PRE-MEAL Fasting Lunch Dinner Bedtime Overall  Glucose range:  142-224      Mean/median:     179     Self-care: The diet that the patient has been following is: tries to limit sweet drinks.      Meal times: Breakfast: 9 AM Lunch: 1 PM Dinner: 6 PM   Typical meal intake: Breakfast can occasionally be a fast food biscuit, lunch is a sandwich or vegetables, variable meal at suppertime.                Dietician visit, most recent: 6/16  Weight history: Highest  previously 256  Wt Readings from Last 3 Encounters:  01/03/20 230 lb 9.6 oz (104.6 kg)  05/18/19 222 lb 9.6 oz (101 kg)  03/01/19 218 lb (98.9 kg)    Glycemic control:   Lab Results  Component Value Date  HGBA1C 8.5 (H) 12/27/2019   HGBA1C 7.7 (A) 03/01/2019   HGBA1C 7.5 (A) 11/28/2018   Lab Results  Component Value Date   MICROALBUR 2.0 (H) 12/27/2019   LDLCALC 64 04/25/2014   CREATININE 0.67 12/27/2019    Other active problems: See review of systems    Allergies as of 01/03/2020      Reactions   Rosuvastatin Other (See Comments)   REACTION: myalgias      Medication List       Accurate as of January 03, 2020  2:43 PM. If you have any questions, ask your nurse or doctor.        STOP taking these medications   Farxiga 10 MG Tabs tablet Generic drug: dapagliflozin propanediol Stopped by: Reather Littler, MD   NovoLOG FlexPen 100 UNIT/ML FlexPen Generic drug: insulin aspart Stopped by: Reather Littler, MD     TAKE these medications   aspirin 81 MG tablet Take 81 mg by mouth daily.   atorvastatin 20 MG tablet Commonly known as: LIPITOR TAKE 1 TABLET(20 MG) BY  MOUTH DAILY   fenofibrate micronized 200 MG capsule Commonly known as: LOFIBRA TAKE 1 CAPSULE BY MOUTH EVERY DAY   glucose blood test strip USE TO TEST BLOOD SUGAR TWICE DAILY DX CODE E11.65   OneTouch Verio test strip Generic drug: glucose blood Use as instructed to test sugars once daily   HumuLIN R U-500 KwikPen 500 UNIT/ML kwikpen Generic drug: insulin regular human CONCENTRATED Units, 30 minutes before each meal Started by: Reather Littler, MD   metFORMIN 500 MG 24 hr tablet Commonly known as: GLUCOPHAGE-XR TAKE 4 TABLETS BY MOUTH DAILY WITH SUPPER   NovoFine 32G X 6 MM Misc Generic drug: Insulin Pen Needle USE TO INJECT INSULIN TWICE DAILY.   Omega 3 1000 MG Caps Take 1,000 mg by mouth daily. TAKE 1 TABLET BY MOUTH ONCE DAILY.   OneTouch Delica Lancets 30G Misc 1 each by Does not apply route daily. Use onetouch delica lancets to check blood sugar once daily.   ramipril 2.5 MG capsule Commonly known as: ALTACE TAKE 1 CAPSULE BY MOUTH EVERY DAY   Tresiba FlexTouch 200 UNIT/ML FlexTouch Pen Generic drug: insulin degludec INJECT 136 UNITS UNDER THE SKIN ONCE DAILY What changed: See the new instructions.   Victoza 18 MG/3ML Sopn Generic drug: liraglutide ADMINISTER 1.8 MG UNDER THE SKIN AT THE SAME TIME EVERY DAY       Allergies:  Allergies  Allergen Reactions  . Rosuvastatin Other (See Comments)    REACTION: myalgias    Past Medical History:  Diagnosis Date  . Aortic valve sclerosis    echo june 2009  . CAD (coronary artery disease)    a. 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 b. s/p CABG 04/2011.  . Carotid artery disease (HCC)    Doppler, preop, September, 2012,  mild bilateral plaque with no significant stenoses  . Diabetes mellitus   . Dizziness    June, 2013  . Ejection fraction    a. Ejection fraction 60%, echo, June, 2009. b. EF preserved by cath 2012.  [name] Food poisoning    age 21  . GERD (gastroesophageal  reflux disease)   . Hx of CABG    [name],  x4  May 01, 2011 limited to the LAD, left radial artery to the OM, SVG to diagonal, SVG to posterior descending  . Hyperlipidemia   . Hypertension   . Muscle ache    on  Crestor  09/04/09 CPK 193(7-232) tolerated simvastatin in the past  . PVC (premature ventricular contraction) 06/2009   November, 2010  . Rash     Past Surgical History:  Procedure Laterality Date  . BACK SURGERY    . coccyx bone removed    . CORONARY ARTERY BYPASS GRAFT  05/01/2011   CABG x4 with LIMA to LAD, LRA to OM, SVG to D1, SVG to PDA, EVH via left thigh  . Intraoperative transesophageal echocardiography.  05/01/2011  . pilonidal cystectomy      Family History  Problem Relation Age of Onset  . Heart attack Paternal Grandfather        late 7860s  . Hearing loss Paternal Grandfather   . Hypertension Father   . Heart attack Father 65       bypass  . Diabetes Father   . Hearing loss Father   . Thyroid disease Father   . Cancer Maternal Grandfather        site unknown  . Prostate cancer Maternal Grandfather   . Diabetes Paternal Aunt        X 3  . Heart failure Maternal Grandmother   . Diabetes Mother   . Diabetes Paternal Uncle   . Stroke Neg Hx     Social History:  reports that he has never smoked. He has never used smokeless tobacco. He reports that he does not drink alcohol or use drugs.    Review of Systems   Lipid history: He has been taking fish oil for significantly high triglycerides along with his Lipitor 20 mg  Triglycerides have been consistently high; his insurance will not cover Vascepa or Lovaza Currently on fenofibrate and OTC omega-3 fish oil  Last triglycerides 208 fasting    Lab Results  Component Value Date   CHOL 117 12/27/2019   HDL 29.60 (L) 12/27/2019   LDLCALC 64 04/25/2014   LDLDIRECT 63.0 12/27/2019   TRIG 208.0 (H) 12/27/2019   CHOLHDL 4 12/27/2019            THYROID: He has a 2.4 cm dominant left-sided thyroid  nodule on Ultrasound in 12/2014  Follow-up showed the same size of the nodules  TSH was 1.6 done by PCP in 06/2019  HYPERTENSION:  on  2.5 mg ramipril  Periodically checking at pharmacy   Blood pressure recently about 140/80  BP Readings from Last 3 Encounters:  01/03/20 (!) 144/82  05/18/19 140/68  03/01/19 (!) 148/92     LABS:  No visits with results within 1 Week(s) from this visit.  Latest known visit with results is:  Lab on 12/27/2019  Component Date Value Ref Range Status  . Cholesterol 12/27/2019 117  0 - 200 mg/dL Final   ATP III Classification       Desirable:  < 200 mg/dL               Borderline High:  200 - 239 mg/dL          High:  > = 161240 mg/dL  . Triglycerides 12/27/2019 208.0* 0.0 - 149.0 mg/dL Final   Normal:  <096<150 mg/dLBorderline High:  150 - 199 mg/dL  . HDL 12/27/2019 29.60* >39.00 mg/dL Final  . VLDL 04/54/098105/26/2021 41.6* 0.0 - 40.0 mg/dL Final  . Total CHOL/HDL Ratio 12/27/2019 4   Final                  Men          Women1/2 Average Risk  3.4          3.3Average Risk          5.0          4.42X Average Risk          9.6          7.13X Average Risk          15.0          11.0                      . NonHDL 12/27/2019 87.82   Final   NOTE:  Non-HDL goal should be 30 mg/dL higher than patient's LDL goal (i.e. LDL goal of < 70 mg/dL, would have non-HDL goal of < 100 mg/dL)  . Microalb, Ur 12/27/2019 2.0* 0.0 - 1.9 mg/dL Final  . Creatinine,U 12/27/2019 167.3  mg/dL Final  . Microalb Creat Ratio 12/27/2019 1.2  0.0 - 30.0 mg/g Final  . Sodium 12/27/2019 137  135 - 145 mEq/L Final  . Potassium 12/27/2019 4.1  3.5 - 5.1 mEq/L Final  . Chloride 12/27/2019 104  96 - 112 mEq/L Final  . CO2 12/27/2019 26  19 - 32 mEq/L Final  . Glucose, Bld 12/27/2019 206* 70 - 99 mg/dL Final  . BUN 12/27/2019 14  6 - 23 mg/dL Final  . Creatinine, Ser 12/27/2019 0.67  0.40 - 1.50 mg/dL Final  . Total Bilirubin 12/27/2019 0.5  0.2 - 1.2 mg/dL Final  . Alkaline Phosphatase  12/27/2019 36* 39 - 117 U/L Final  . AST 12/27/2019 20  0 - 37 U/L Final  . ALT 12/27/2019 27  0 - 53 U/L Final  . Total Protein 12/27/2019 6.6  6.0 - 8.3 g/dL Final  . Albumin 12/27/2019 4.4  3.5 - 5.2 g/dL Final  . GFR 12/27/2019 118.14  >60.00 mL/min Final  . Calcium 12/27/2019 8.8  8.4 - 10.5 mg/dL Final  . Hgb A1c MFr Bld 12/27/2019 8.5* 4.6 - 6.5 % Final   Glycemic Control Guidelines for People with Diabetes:Non Diabetic:  <6%Goal of Therapy: <7%Additional Action Suggested:  >8%   . Direct LDL 12/27/2019 63.0  mg/dL Final   Optimal:  <100 mg/dLNear or Above Optimal:  100-129 mg/dLBorderline High:  130-159 mg/dLHigh:  160-189 mg/dLVery High:  >190 mg/dL    Physical Examination:  BP (!) 144/82 (BP Location: Left Arm, Patient Position: Sitting, Cuff Size: Normal)   Pulse 75   Ht 5' 7.5" (1.715 m)   Wt 230 lb 9.6 oz (104.6 kg)   SpO2 98%   BMI 35.58 kg/m     ASSESSMENT:  Diabetes type 2 on insulin    See history of present illness for detailed discussion of  current management, blood sugar patterns and problems identified  His A1c is relatively high at 8.5  He has gained weight and blood sugars are out of control with his leaving off [place] which he cannot tolerate because of polyuria Also diet can be better especially at breakfast  Also even though he does not monitor it much he likely has high readings after his evening meal at least He is interested in weight loss but currently unable to exercise Discussed again that he is taking large amounts of basal insulin and his NovoLog dose is relatively small and likely to cover his meals Also discussed that the evening dose is independent of the lunchtime coverage and this insulin only last for 4 hours  He can benefit from higher  doses of Marcelline Deist but he apparently gets excessive polyuria with the 10 mg dose  HYPERTENSION: Blood pressure is slightly higher with not taking [name] but he has also had recent chronic pain  May  consider increasing ramipril to 5 mg   LIPIDS: Followed by PCP, triglycerides still relatively high but blood sugars are not well controlled  PLAN:   He will be tried on U-500 insulin instead of NovoLog which he recently ran out of  We will need to take this 3 times a day to start with  Explained the need to take it 30 minutes before eating  Also discussed checking sugars 2 hours after every meal at least by rotation and target of 180 or less  He will start with 20 units in the morning, 30 at lunch and 35 at dinner  Discussed concentrated insulin, how the pen is used and adjustment of the dose  He can go up or down 5 units for different sized meals also  Needs to cut back on high fat and high carbohydrate meals especially at breakfast  Resume exercise when able to  Reduce Tresiba to 70 units and go up only if morning sugars are consistently high after 1 week  Patient Instructions  R 500 insulin 20 units in am  30 before lunch and 35 before supper  Keep 2 hr sugar 140-180  Tresiba 70, keep am sugar 100-140 target  Check blood sugars on waking up 7 days a week  Also check blood sugars about 2 hours after meals and do this after different meals by rotation  Recommended blood sugar levels on waking up are 90-130 and about 2 hours after meal is 130-180  Please bring your blood sugar monitor to each visit, thank you         Reather Littler 01/03/2020, 2:43 PM   Note: This office note was prepared with Dragon voice recognition system technology. Any transcriptional errors that result from this process are unintentional.

## 2020-01-03 NOTE — Patient Instructions (Addendum)
R 500 insulin 20 units in am  30 before lunch and 35 before supper  Keep 2 hr sugar 140-180  Tresiba 70, keep am sugar 100-140 target  Check blood sugars on waking up 7 days a week  Also check blood sugars about 2 hours after meals and do this after different meals by rotation  Recommended blood sugar levels on waking up are 90-130 and about 2 hours after meal is 130-180  Please bring your blood sugar monitor to each visit, thank you

## 2020-01-05 ENCOUNTER — Other Ambulatory Visit: Payer: Self-pay

## 2020-01-05 ENCOUNTER — Telehealth: Payer: Self-pay | Admitting: Endocrinology

## 2020-01-05 MED ORDER — HUMULIN R U-500 KWIKPEN 500 UNIT/ML ~~LOC~~ SOPN: PEN_INJECTOR | SUBCUTANEOUS | 2 refills | Status: DC

## 2020-01-05 NOTE — Telephone Encounter (Signed)
Referred to pt's last office visit note, which indicates that the pt should take 20 units at breakfast, 30 at lunch, and 35 at supper, and he may also increase or decrease by 5 units based on meal size.  An Rx with these instructions have been sent.

## 2020-01-05 NOTE — Telephone Encounter (Signed)
Per patient   Humulin R prescription needs dosing instructions sent to pharmacy

## 2020-01-17 ENCOUNTER — Other Ambulatory Visit: Payer: Self-pay

## 2020-01-17 MED ORDER — HUMULIN R U-500 KWIKPEN 500 UNIT/ML ~~LOC~~ SOPN: PEN_INJECTOR | SUBCUTANEOUS | 2 refills | Status: DC

## 2020-01-22 ENCOUNTER — Other Ambulatory Visit: Payer: Self-pay | Admitting: Endocrinology

## 2020-02-07 ENCOUNTER — Ambulatory Visit: Payer: Medicare HMO | Admitting: Endocrinology

## 2020-02-10 ENCOUNTER — Other Ambulatory Visit: Payer: Self-pay | Admitting: Endocrinology

## 2020-02-28 ENCOUNTER — Other Ambulatory Visit: Payer: Self-pay | Admitting: Endocrinology

## 2020-03-05 ENCOUNTER — Other Ambulatory Visit: Payer: Self-pay | Admitting: Endocrinology

## 2020-03-11 ENCOUNTER — Other Ambulatory Visit: Payer: Self-pay

## 2020-03-11 ENCOUNTER — Ambulatory Visit: Payer: Medicare HMO | Admitting: Endocrinology

## 2020-03-11 ENCOUNTER — Encounter: Payer: Self-pay | Admitting: Endocrinology

## 2020-03-11 VITALS — BP 170/82 | HR 84 | Ht 67.5 in | Wt 218.2 lb

## 2020-03-11 DIAGNOSIS — Z794 Long term (current) use of insulin: Secondary | ICD-10-CM | POA: Diagnosis not present

## 2020-03-11 DIAGNOSIS — I10 Essential (primary) hypertension: Secondary | ICD-10-CM | POA: Diagnosis not present

## 2020-03-11 DIAGNOSIS — E1165 Type 2 diabetes mellitus with hyperglycemia: Secondary | ICD-10-CM

## 2020-03-11 NOTE — Progress Notes (Signed)
Patient ID: [name] [name], male   DOB: [birth year], 69 y.o.   MRN: [id number]           Reason for Appointment: Follow-up for Type 2 Diabetes    History of Present Illness:          Date of diagnosis of type 2 diabetes mellitus: 2011        Background history:  He was probably started on metformin at diagnosis and subsequently given Amaryl also Subsequently had been on Janumet and more recently this [facility]. Because of poor control he was given Levemir in addition to his Kombiglyze in 01/2013 The dose was progressively increased from initial dose of 12 units He did have somewhat better control right after starting insulin with A1c below 7% but only one time His blood sugars had been consistently high since 2014 with A1c at least 8%  Recent history:   INSULIN regimen is described as: [place] 124 units daily.   Humulin R 5 to 6 units at breakfast and lunch, and 25-30 at dinner  Non-insulin hypoglycemic drugs the patient is taking are: Metformin ER 2 g, Victoza 1.8 mg daily   His A1c is higher on his last visit at 8.5 compared to 7. 7  A1c has been as low as 6.8  Current blood sugar patterns, management and problems identified:  He has started the U-500 insulin but even though he was given set doses he has taken only small amounts  He said that he is changing his diet with eating only small meals during the day and a more complete meal in the evening  However he has only recently started having somewhat better readings after dinner which have been as high as 301  In the last 2 weeks he has checked his sugar after dinner only on 3 days  With starting this his FASTING readings are much better as well as more consistently compared to the last time when they were averaging 193  He may not always take his Humulin R 30 minutes before eating  Because of his low back pain he has not done any physical activity  His weight is down 12 pounds    Side effects  from medications have been: ?  Dizziness from Invokana  Hypoglycemia: None    Glucose monitoring:  done less than once  in a day         Glucometer: One Touch Verio    Blood Glucose readings from monitor with averages:    PRE-MEAL Fasting Lunch Dinner Bedtime Overall  Glucose range:  110-164    174-301   Mean/median:  129     175    Previous readings  PRE-MEAL Fasting Lunch Dinner Bedtime Overall  Glucose range: 158-214      Mean/median:  193    220   POST-MEAL PC Breakfast PC Lunch PC Dinner  Glucose range:   223, 253  195-305  Mean/median:    250     Self-care: The diet that the patient has been following is: tries to limit sweet drinks.      Meal times: Breakfast: 9 AM Lunch: 1 PM Dinner: 6 PM   Typical meal intake: Breakfast can occasionally be a fast food biscuit, lunch is a sandwich or vegetables, variable meal at suppertime.                Dietician visit, most recent: 6/16  Weight history: Highest  previously 256  Wt Readings from Last 3  Encounters:  03/11/20 218 lb 3.2 oz (99 kg)  01/03/20 230 lb 9.6 oz (104.6 kg)  05/18/19 222 lb 9.6 oz (101 kg)    Glycemic control:   Lab Results  Component Value Date   HGBA1C 8.5 (H) 12/27/2019   HGBA1C 7.7 (A) 03/01/2019   HGBA1C 7.5 (A) 11/28/2018   Lab Results  Component Value Date   MICROALBUR 2.0 (H) 12/27/2019   LDLCALC 64 04/25/2014   CREATININE 0.67 12/27/2019    Other active problems: See review of systems    Allergies as of 03/11/2020      Reactions   Rosuvastatin Other (See Comments)   REACTION: myalgias      Medication List       Accurate as of March 11, 2020  1:58 PM. If you have any questions, ask your nurse or doctor.        aspirin 81 MG tablet Take 81 mg by mouth daily.   atorvastatin 20 MG tablet Commonly known as: LIPITOR TAKE 1 TABLET(20 MG) BY MOUTH DAILY   fenofibrate micronized 200 MG capsule Commonly known as: LOFIBRA TAKE 1 CAPSULE BY MOUTH EVERY DAY   glucose blood  test strip USE TO TEST BLOOD SUGAR TWICE DAILY DX CODE E11.65   OneTouch Verio test strip Generic drug: glucose blood Use as instructed to test sugars once daily   HumuLIN R U-500 KwikPen 500 UNIT/ML kwikpen Generic drug: insulin regular human CONCENTRATED Inject 20 units under the skin before breakfast, 30 at lunch, and 35 at dinner. May go up or down by 5 units based on meal size.   metFORMIN 500 MG 24 hr tablet Commonly known as: GLUCOPHAGE-XR TAKE 4 TABLETS BY MOUTH DAILY WITH SUPPER   NovoFine 32G X 6 MM Misc Generic drug: Insulin Pen Needle USE TO INJECT INSULIN TWICE DAILY.   Omega 3 1000 MG Caps Take 1,000 mg by mouth daily. TAKE 1 TABLET BY MOUTH ONCE DAILY.   OneTouch Delica Lancets 30G Misc 1 each by Does not apply route daily. Use onetouch delica lancets to check blood sugar once daily.   ramipril 2.5 MG capsule Commonly known as: ALTACE TAKE 1 CAPSULE BY MOUTH EVERY DAY   Tresiba FlexTouch 200 UNIT/ML FlexTouch Pen Generic drug: insulin degludec INJECT 136 UNITS UNDER THE SKIN ONCE DAILY What changed: See the new instructions.   Victoza 18 MG/3ML Sopn Generic drug: liraglutide ADMINISTER 1.8 MG UNDER THE SKIN AT THE SAME TIME EVERY DAY       Allergies:  Allergies  Allergen Reactions  . Rosuvastatin Other (See Comments)    REACTION: myalgias    Past Medical History:  Diagnosis Date  . Aortic valve sclerosis    echo june 2009  . CAD (coronary artery disease)    a. 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 b. s/p CABG 04/2011.  . Carotid artery disease (HCC)    Doppler, preop, September, 2012,  mild bilateral plaque with no significant stenoses  . Diabetes mellitus   . Dizziness    June, 2013  . Ejection fraction    a. Ejection fraction 60%, echo, June, 2009. b. EF preserved by cath 2012.  [name] Food poisoning    age 16  . GERD (gastroesophageal reflux disease)   . Hx of CABG    [name],  x4  May 01, 2011  limited to the LAD, left radial artery to the OM, SVG to diagonal, SVG to posterior descending  . Hyperlipidemia   .  Hypertension   . Muscle ache    on Crestor  09/04/09 CPK 193(7-232) tolerated simvastatin in the past  . PVC (premature ventricular contraction) 06/2009   November, 2010  . Rash     Past Surgical History:  Procedure Laterality Date  . BACK SURGERY    . coccyx bone removed    . CORONARY ARTERY BYPASS GRAFT  05/01/2011   CABG x4 with LIMA to LAD, LRA to OM, SVG to D1, SVG to PDA, EVH via left thigh  . Intraoperative transesophageal echocardiography.  05/01/2011  . pilonidal cystectomy      Family History  Problem Relation Age of Onset  . Heart attack Paternal Grandfather        late 33s  . Hearing loss Paternal Grandfather   . Hypertension Father   . Heart attack Father 65       bypass  . Diabetes Father   . Hearing loss Father   . Thyroid disease Father   . Cancer Maternal Grandfather        site unknown  . Prostate cancer Maternal Grandfather   . Diabetes Paternal Aunt        X 3  . Heart failure Maternal Grandmother   . Diabetes Mother   . Diabetes Paternal Uncle   . Stroke Neg Hx     Social History:  reports that he has never smoked. He has never used smokeless tobacco. He reports that he does not drink alcohol and does not use drugs.    Review of Systems   Lipid history: He has been taking fish oil for significantly high triglycerides along with his Lipitor 20 mg  Triglycerides have been consistently high; his insurance will not cover Vascepa or Lovaza Currently on fenofibrate and OTC omega-3 fish oil  Last triglycerides 208 fasting    Lab Results  Component Value Date   CHOL 117 12/27/2019   HDL 29.60 (L) 12/27/2019   LDLCALC 64 04/25/2014   LDLDIRECT 63.0 12/27/2019   TRIG 208.0 (H) 12/27/2019   CHOLHDL 4 12/27/2019            THYROID: He has a 2.4 cm dominant left-sided thyroid nodule on Ultrasound in 12/2014  Follow-up showed the  same size of the nodules  TSH was 1.6 done by PCP in 06/2019  HYPERTENSION:  on  2.5 mg ramipril  Periodically checking at pharmacy   Blood pressure upto 200 with pain  BP Readings from Last 3 Encounters:  03/11/20 (!) 170/82  01/03/20 (!) 144/82  05/18/19 140/68     LABS:  No visits with results within 1 Week(s) from this visit.  Latest known visit with results is:  Lab on 12/27/2019  Component Date Value Ref Range Status  . Cholesterol 12/27/2019 117  0 - 200 mg/dL Final   ATP III Classification       Desirable:  < 200 mg/dL               Borderline High:  200 - 239 mg/dL          High:  > = 409 mg/dL  . Triglycerides 12/27/2019 208.0* 0 - 149 mg/dL Final   Normal:  <811 mg/dLBorderline High:  150 - 199 mg/dL  . HDL 12/27/2019 29.60* >39.00 mg/dL Final  . VLDL 91/47/8295 41.6* 0.0 - 40.0 mg/dL Final  . Total CHOL/HDL Ratio 12/27/2019 4   Final                  Men  Women1/2 Average Risk     3.4          3.3Average Risk          5.0          4.42X Average Risk          9.6          7.13X Average Risk          15.0          11.0                      . NonHDL 12/27/2019 87.82   Final   NOTE:  Non-HDL goal should be 30 mg/dL higher than patient's LDL goal (i.e. LDL goal of < 70 mg/dL, would have non-HDL goal of < 100 mg/dL)  . Microalb, Ur 12/27/2019 2.0* 0.0 - 1.9 mg/dL Final  . Creatinine,U 60/45/4098 167.3  mg/dL Final  . Microalb Creat Ratio 12/27/2019 1.2  0.0 - 30.0 mg/g Final  . Sodium 12/27/2019 137  135 - 145 mEq/L Final  . Potassium 12/27/2019 4.1  3.5 - 5.1 mEq/L Final  . Chloride 12/27/2019 104  96 - 112 mEq/L Final  . CO2 12/27/2019 26  19 - 32 mEq/L Final  . Glucose, Bld 12/27/2019 206* 70 - 99 mg/dL Final  . BUN 11/91/4782 14  6 - 23 mg/dL Final  . Creatinine, Ser 12/27/2019 0.67  0.40 - 1.50 mg/dL Final  . Total Bilirubin 12/27/2019 0.5  0.2 - 1.2 mg/dL Final  . Alkaline Phosphatase 12/27/2019 36* 39 - 117 U/L Final  . AST 12/27/2019 20  0 - 37 U/L  Final  . ALT 12/27/2019 27  0 - 53 U/L Final  . Total Protein 12/27/2019 6.6  6.0 - 8.3 g/dL Final  . Albumin 95/62/1308 4.4  3.5 - 5.2 g/dL Final  . GFR 65/78/4696 118.14  >60.00 mL/min Final  . Calcium 12/27/2019 8.8  8.4 - 10.5 mg/dL Final  . Hgb E9B MFr Bld 12/27/2019 8.5* 4.6 - 6.5 % Final   Glycemic Control Guidelines for People with Diabetes:Non Diabetic:  <6%Goal of Therapy: <7%Additional Action Suggested:  >8%   . Direct LDL 12/27/2019 63.0  mg/dL Final   Optimal:  <284 mg/dLNear or Above Optimal:  100-129 mg/dLBorderline High:  130-159 mg/dLHigh:  160-189 mg/dLVery High:  >190 mg/dL    Physical Examination:  BP (!) 170/82 (BP Location: Left Arm, Patient Position: Sitting, Cuff Size: Normal) Comment: MD NTFD  Pulse 84   Ht 5' 7.5" (1.715 m)   Wt 218 lb 3.2 oz (99 kg)   SpO2 95%   BMI 33.67 kg/m     ASSESSMENT:  Diabetes type 2 on insulin     See history of present illness for detailed discussion of  current management, blood sugar patterns and problems identified  His A1c was last at 8.5  His blood sugars are improving both from his trying to improve his diet with cutting back on portions and high-fat foods as well as likely some weight loss from significant pain that he has had Also likely benefiting from U-500 insulin although he still has mostly high readings after dinner from taking in adequate insulin coverage Has been able to reduce his Evaristo Bury to 124 units Fasting readings are more stable and better controlled  HYPERTENSION: Blood pressure is higher with not taking [name] but he also has more pain  He can start increasing ramipril to 5 mg    PLAN:  He was explained the need to control his blood sugars after meals since these are the highest and need more mealtime insulin  He will take at least 25 to 30 units and likely larger doses to keep his postprandial readings at least under 180  Monitoring frequency and timing was discussed in detail  He can  cut back on fasting blood sugar testing  Continue low carbohydrate diet  Resume exercise when needed  Discussed the need to take up to twice as much Humulin R insulin when his blood sugars go up with epidural steroid tomorrow  With this also he will need to take his insulin regularly regardless of meal size when he has high sugars as well as checking blood sugars up to 4 times a day  A1c on the next visit  Would consider Marcelline Deist on the next visit also  Patient Instructions  U-500 insulin: Take average of 35 units for main meal  Take 40-70 units after shot in back  [name] 116-120 units  Check blood sugars on waking up 3-4  days a week  Also check blood sugars about 2 hours after meals and do this after different meals by rotation  Recommended blood sugar levels on waking up are 90-130 and about 2 hours after meal is 130-160  Please bring your blood sugar monitor to each visit, thank you         Reather Littler 03/11/2020, 1:58 PM   Note: This office note was prepared with Dragon voice recognition system technology. Any transcriptional errors that result from this process are unintentional.

## 2020-03-11 NOTE — Patient Instructions (Addendum)
U-500 insulin: Take average of 35 units for main meal  Take 40-70 units after shot in back  Tresiba 116-120 units  Check blood sugars on waking up 3-4  days a week  Also check blood sugars about 2 hours after meals and do this after different meals by rotation  Recommended blood sugar levels on waking up are 90-130 and about 2 hours after meal is 130-160  Please bring your blood sugar monitor to each visit, thank you  Take 2 ramipril for hi BP

## 2020-04-07 ENCOUNTER — Other Ambulatory Visit: Payer: Self-pay | Admitting: Endocrinology

## 2020-04-08 ENCOUNTER — Other Ambulatory Visit: Payer: Self-pay | Admitting: Endocrinology

## 2020-04-09 ENCOUNTER — Other Ambulatory Visit: Payer: Self-pay | Admitting: Endocrinology

## 2020-04-18 ENCOUNTER — Other Ambulatory Visit: Payer: Self-pay | Admitting: Endocrinology

## 2020-05-16 ENCOUNTER — Other Ambulatory Visit: Payer: Self-pay | Admitting: Endocrinology

## 2020-05-17 ENCOUNTER — Other Ambulatory Visit: Payer: Self-pay | Admitting: Cardiology

## 2020-05-22 ENCOUNTER — Encounter: Payer: Self-pay | Admitting: Cardiology

## 2020-05-22 ENCOUNTER — Other Ambulatory Visit: Payer: Self-pay

## 2020-05-22 ENCOUNTER — Ambulatory Visit: Payer: Medicare HMO | Admitting: Cardiology

## 2020-05-22 VITALS — BP 168/80 | HR 85 | Ht 67.5 in | Wt 206.0 lb

## 2020-05-22 DIAGNOSIS — I2510 Atherosclerotic heart disease of native coronary artery without angina pectoris: Secondary | ICD-10-CM

## 2020-05-22 DIAGNOSIS — I2583 Coronary atherosclerosis due to lipid rich plaque: Secondary | ICD-10-CM

## 2020-05-22 DIAGNOSIS — E782 Mixed hyperlipidemia: Secondary | ICD-10-CM

## 2020-05-22 MED ORDER — HYDROCHLOROTHIAZIDE 25 MG PO TABS: 25.0000 mg | ORAL_TABLET | Freq: Every day | ORAL | 3 refills | Status: DC

## 2020-05-22 NOTE — Progress Notes (Signed)
Cardiology Office Note:    Date:  05/22/2020   ID:  [name] [name], DOB 11/08/1950, MRN [id number]  PCP:  Creola Corn, MD  [facility] Cardiologist:  Donato Schultz, MD  [facility] Electrophysiologist:  None   Referring MD: Creola Corn, MD   History of Present Illness:    [name] [name] is a 69 y.o. male here for the follow-up of coronary artery disease status post CABG with formal PCI to RCA prior to his bypass. Retired Emergency planning/management officer.  Overall doing quite well.  4 months ago had back issue, steroids, etc, surgery on Sept 28. Now on steroid pack.   His blood pressures have been elevated, likely in response to pain.  Minimal pain currently but his systolic is 168.  He and his mother have a blood pressure cuff, I have asked him to take some readings at home.  He denies any chest discomfort.  No shortness of breath.  Past Medical History:  Diagnosis Date  . Aortic valve sclerosis    echo june 2009  . CAD (coronary artery disease)    a. 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 b. s/p CABG 04/2011.  . Carotid artery disease (HCC)    Doppler, preop, September, 2012,  mild bilateral plaque with no significant stenoses  . Diabetes mellitus   . Dizziness    June, 2013  . Ejection fraction    a. Ejection fraction 60%, echo, June, 2009. b. EF preserved by cath 2012.  [name] Food poisoning    age 53  . GERD (gastroesophageal reflux disease)   . Hx of CABG    [name],  x4  May 01, 2011 limited to the LAD, left radial artery to the OM, SVG to diagonal, SVG to posterior descending  . Hyperlipidemia   . Hypertension   . Muscle ache    on Crestor  09/04/09 CPK 193(7-232) tolerated simvastatin in the past  . PVC (premature ventricular contraction) 06/2009   November, 2010  . Rash     Past Surgical History:  Procedure Laterality Date  . BACK SURGERY    . coccyx bone removed    . CORONARY ARTERY BYPASS GRAFT  05/01/2011   CABG x4 with LIMA  to LAD, LRA to OM, SVG to D1, SVG to PDA, EVH via left thigh  . Intraoperative transesophageal echocardiography.  05/01/2011  . pilonidal cystectomy      Current Medications: Current Meds  Medication Sig  . aspirin 81 MG tablet Take 81 mg by mouth daily.    [name] atorvastatin (LIPITOR) 20 MG tablet TAKE 1 TABLET(20 MG) BY MOUTH DAILY  . fenofibrate micronized (LOFIBRA) 200 MG capsule TAKE 1 CAPSULE BY MOUTH EVERY DAY  . glucose blood (ONETOUCH VERIO) test strip USE AS DIRECTED TO TEST SUGAR ONCE DAILY  . glucose blood test strip USE TO TEST BLOOD SUGAR TWICE DAILY DX CODE E11.65  . insulin regular human CONCENTRATED (HUMULIN R U-500 KWIKPEN) 500 UNIT/ML kwikpen Inject 20 units under the skin before breakfast, 30 at lunch, and 35 at dinner. May go up or down by 5 units based on meal size.  . Lancets (ONETOUCH DELICA PLUS LANCET30G) MISC USE ONCE DAILY TO TEST BLOOD SUGAR  . metFORMIN (GLUCOPHAGE-XR) 500 MG 24 hr tablet TAKE 4 TABLETS BY MOUTH DAILY WITH SUPPER  . NOVOFINE 32G X 6 MM MISC USE TO INJECT INSULIN TWICE DAILY.  [name] Omega 3 1000 MG CAPS Take 1,000 mg by mouth daily.  TAKE 1 TABLET BY MOUTH ONCE DAILY.  . ramipril (ALTACE) 5 MG capsule TAKE 1 CAPSULE(5 MG) BY MOUTH DAILY (Patient taking differently: Take 10 mg by mouth daily. )  . TRESIBA FLEXTOUCH 200 UNIT/ML FlexTouch Pen INJECT 136 UNITS UNDER THE SKIN ONCE DAILY  . VICTOZA 18 MG/3ML SOPN ADMINISTER 1.8 MG UNDER THE SKIN AT THE SAME TIME EVERY DAY     Allergies:   Rosuvastatin   Social History   Socioeconomic History  . Marital status: Single    Spouse name: Not on file  . Number of children: Not on file  . Years of education: Not on file  . Highest education level: Not on file  Occupational History  . Occupation: Emergency planning/management officerolice officer   Tobacco Use  . Smoking status: Never Smoker  . Smokeless tobacco: Never Used  Substance and Sexual Activity  . Alcohol use: No  . Drug use: No  . Sexual activity: Not on file  Other Topics  Concern  . Not on file  Social History Narrative  . Not on file   Social Determinants of Health   Financial Resource Strain:   . Difficulty of Paying Living Expenses: Not on file  Food Insecurity:   . Worried About Programme researcher, broadcasting/film/videounning Out of Food in the Last Year: Not on file  . Ran Out of Food in the Last Year: Not on file  Transportation Needs:   . Lack of Transportation (Medical): Not on file  . Lack of Transportation (Non-Medical): Not on file  Physical Activity:   . Days of Exercise per Week: Not on file  . Minutes of Exercise per Session: Not on file  Stress:   . Feeling of Stress : Not on file  Social Connections:   . Frequency of Communication with Friends and Family: Not on file  . Frequency of Social Gatherings with Friends and Family: Not on file  . Attends Religious Services: Not on file  . Active Member of Clubs or Organizations: Not on file  . Attends BankerClub or Organization Meetings: Not on file  . Marital Status: Not on file     Family History: The patient's family history includes Cancer in his maternal grandfather; Diabetes in his father, mother, paternal aunt, and paternal uncle; Hearing loss in his father and paternal grandfather; Heart attack in his paternal grandfather; Heart attack (age of onset: 6965) in his father; Heart failure in his maternal grandmother; Hypertension in his father; Prostate cancer in his maternal grandfather; Thyroid disease in his father. There is no history of Stroke.  ROS:   Please see the history of present illness.     All other systems reviewed and are negative.  EKGs/Labs/Other Studies Reviewed:    EKG:  EKG is  ordered today.  The ekg ordered today demonstrates sinus rhythm with PACs 85 bpm  Recent Labs: 12/27/2019: ALT 27; BUN 14; Creatinine, Ser 0.67; Potassium 4.1; Sodium 137  Recent Lipid Panel    Component Value Date/Time   CHOL 117 12/27/2019 0907   TRIG 208.0 (H) 12/27/2019 0907   HDL 29.60 (L) 12/27/2019 0907   CHOLHDL 4  12/27/2019 0907   VLDL 41.6 (H) 12/27/2019 0907   LDLCALC 64 04/25/2014 0957   LDLDIRECT 63.0 12/27/2019 0907    Physical Exam:    VS:  BP (!) 168/80   Pulse 85   Ht 5' 7.5" (1.715 m)   Wt 206 lb (93.4 kg)   SpO2 96%   BMI 31.79 kg/m     Wt  Readings from Last 3 Encounters:  05/22/20 206 lb (93.4 kg)  03/11/20 218 lb 3.2 oz (99 kg)  01/03/20 230 lb 9.6 oz (104.6 kg)     GEN:  Well nourished, well developed in no acute distress HEENT: Normal NECK: No JVD; No carotid bruits LYMPHATICS: No lymphadenopathy CARDIAC: RRR, no murmurs, rubs, gallops RESPIRATORY:  Clear to auscultation without rales, wheezing or rhonchi  ABDOMEN: Soft, non-tender, non-distended MUSCULOSKELETAL:  No edema; No deformity  SKIN: Warm and dry NEUROLOGIC:  Alert and oriented x 3 PSYCHIATRIC:  Normal affect   ASSESSMENT:    1. Coronary artery disease due to lipid rich plaque   2. Mixed hyperlipidemia    PLAN:    In order of problems listed above:  CAD post CABG -Doing well.  No anginal symptoms.  Continue with aggressive secondary risk factor prevention.  Medical management includes Altace and atorvastatin and aspirin.  Diabetes with hypertension -Hemoglobin A1c 8.5 at last check from outpatient.  His blood pressure 2 has been elevated in the 160s.  Sometimes at home it will be down in the 120s.  I would like to start HCTZ 25 mg once a day.  He has already doubled his Altace 10 mg a day.  Blood pressure at one point was quite low.  Obviously his back pain is attenuating.  In 1 month I will check a basic metabolic profile.  Hyperlipidemia -Atorvastatin 20 mg-LDL 63, no myalgias.  Doing well.  Carotid artery plaque -Mild.  Father had carotid endarterectomy.  Continue with statin therapy.  Back pain -Obviously steroids etc. pain can cause increase in blood pressure.  We are going to try to treat more aggressively.  He remembers that his neurosurgeon told him that his blood pressure remained quite  elevated during the surgical procedure.  With see him back in 1 month.   Medication Adjustments/Labs and Tests Ordered: Current medicines are reviewed at length with the patient today.  Concerns regarding medicines are outlined above.  Orders Placed This Encounter  Procedures  . EKG 12-Lead   Meds ordered this encounter  Medications  . hydrochlorothiazide (HYDRODIURIL) 25 MG tablet    Sig: Take 1 tablet (25 mg total) by mouth daily.    Dispense:  90 tablet    Refill:  3    Patient Instructions  Medication Instructions:  Your physician has recommended you make the following change in your medication:  1.) start hydrochlorothiazide (hctz) 25 mg daily  *If you need a refill on your cardiac medications before your next appointment, please call your pharmacy*   Lab Work: At next visit (BMET) If you have labs (blood work) drawn today and your tests are completely normal, you will receive your results only by: [name] MyChart Message (if you have MyChart) OR . A paper copy in the mail If you have any lab test that is abnormal or we need to change your treatment, we will call you to review the results.   Testing/Procedures: none   Follow-Up: At [facility], you and your health needs are our priority.  As part of our continuing mission to provide you with exceptional heart care, we have created designated Provider Care Teams.  These Care Teams include your primary Cardiologist (physician) and Advanced Practice Providers (APPs -  Physician Assistants and Nurse Practitioners) who all work together to provide you with the care you need, when you need it.   Your next appointment:   1 month(s)  The format for your next appointment:  In Person  Provider:   You may see Donato Schultz, MD or one of the following Advanced Practice Providers on your designated Care Team:    Norma Fredrickson, NP  Nada Boozer, NP  Georgie Chard, NP    Other Instructions    Signed, Donato Schultz, MD    05/22/2020 4:13 PM    [facility]

## 2020-05-22 NOTE — Patient Instructions (Signed)
Medication Instructions:  Your physician has recommended you make the following change in your medication:  1.) start hydrochlorothiazide (hctz) 25 mg daily  *If you need a refill on your cardiac medications before your next appointment, please call your pharmacy*   Lab Work: At next visit (BMET) If you have labs (blood work) drawn today and your tests are completely normal, you will receive your results only by: [name] MyChart Message (if you have MyChart) OR . A paper copy in the mail If you have any lab test that is abnormal or we need to change your treatment, we will call you to review the results.   Testing/Procedures: none   Follow-Up: At [facility], you and your health needs are our priority.  As part of our continuing mission to provide you with exceptional heart care, we have created designated Provider Care Teams.  These Care Teams include your primary Cardiologist (physician) and Advanced Practice Providers (APPs -  Physician Assistants and Nurse Practitioners) who all work together to provide you with the care you need, when you need it.   Your next appointment:   1 month(s)  The format for your next appointment:   In Person  Provider:   You may see Donato Schultz, MD or one of the following Advanced Practice Providers on your designated Care Team:    Norma Fredrickson, NP  Nada Boozer, NP  Georgie Chard, NP    Other Instructions

## 2020-05-23 ENCOUNTER — Telehealth: Payer: Self-pay | Admitting: Cardiology

## 2020-05-23 MED ORDER — HYDROCHLOROTHIAZIDE 25 MG PO TABS
25.0000 mg | ORAL_TABLET | Freq: Every day | ORAL | 3 refills | Status: DC
Start: 2020-05-23 — End: 2021-05-29

## 2020-05-23 NOTE — Telephone Encounter (Signed)
Pt called and stated that he does not shop or use [facility]. [facility].  He would like it resent to:  [id number] - [place], [place] - [address]

## 2020-05-23 NOTE — Telephone Encounter (Signed)
Pt's medication as resent to pt's requested pharmacy. Confirmation received.

## 2020-05-31 ENCOUNTER — Telehealth: Payer: Self-pay | Admitting: Endocrinology

## 2020-05-31 NOTE — Telephone Encounter (Signed)
Please see below,  I do not see in your note where you increased to 10 mg, only 5 mg?

## 2020-05-31 NOTE — Telephone Encounter (Signed)
  LAST APPOINTMENT DATE: 05/16/2020   MEDICATION:Ramipril 10 mg(see comments)  PHARMACY:   CLINICAL FILLS OUT ALL BELOW:  LAST REFILL:05/22/20- sent in for 5 mg  Send to [facility] [place]     OTHER COMMENTS: Patient is taking 10 mg of ramipril daily instead of 5 mg. States Dr. Lucianne Muss changed it at his last visit.

## 2020-05-31 NOTE — Telephone Encounter (Signed)
This is unclear.  On the last visit medication list stated 2.5 mg and the dose was increased to 5 mg.  Please clarify with patient.  Also needs to be seen for follow-up blood pressure and diabetes visit

## 2020-06-03 ENCOUNTER — Other Ambulatory Visit: Payer: Self-pay | Admitting: *Deleted

## 2020-06-03 MED ORDER — RAMIPRIL 5 MG PO CAPS
5.0000 mg | ORAL_CAPSULE | Freq: Two times a day (BID) | ORAL | 1 refills | Status: DC
Start: 2020-06-03 — End: 2020-11-13

## 2020-06-03 NOTE — Telephone Encounter (Signed)
Pt called in and said that he was taking 5mg  and it was increased to 10mg  and now he is completely out, please advise. He would like sent in as soon as possible.

## 2020-06-03 NOTE — Telephone Encounter (Signed)
Rx has been sent  

## 2020-06-03 NOTE — Telephone Encounter (Signed)
Please schedule follow-up before end of the month

## 2020-06-12 ENCOUNTER — Other Ambulatory Visit: Payer: Self-pay | Admitting: Endocrinology

## 2020-06-12 NOTE — Telephone Encounter (Signed)
LMTCB to schedule appointment asap

## 2020-06-12 NOTE — Telephone Encounter (Signed)
Patient returned my call. Patient is scheduled for appointment on 06/17/20

## 2020-06-16 ENCOUNTER — Other Ambulatory Visit: Payer: Self-pay | Admitting: Cardiology

## 2020-06-17 ENCOUNTER — Encounter: Payer: Self-pay | Admitting: Endocrinology

## 2020-06-17 ENCOUNTER — Other Ambulatory Visit: Payer: Self-pay

## 2020-06-17 ENCOUNTER — Ambulatory Visit: Payer: Medicare HMO | Admitting: Endocrinology

## 2020-06-17 VITALS — BP 130/74 | HR 80 | Ht 72.0 in | Wt 212.0 lb

## 2020-06-17 DIAGNOSIS — E782 Mixed hyperlipidemia: Secondary | ICD-10-CM

## 2020-06-17 DIAGNOSIS — Z23 Encounter for immunization: Secondary | ICD-10-CM | POA: Diagnosis not present

## 2020-06-17 DIAGNOSIS — Z794 Long term (current) use of insulin: Secondary | ICD-10-CM | POA: Diagnosis not present

## 2020-06-17 DIAGNOSIS — E1165 Type 2 diabetes mellitus with hyperglycemia: Secondary | ICD-10-CM

## 2020-06-17 DIAGNOSIS — E119 Type 2 diabetes mellitus without complications: Secondary | ICD-10-CM | POA: Diagnosis not present

## 2020-06-17 DIAGNOSIS — I10 Essential (primary) hypertension: Secondary | ICD-10-CM

## 2020-06-17 LAB — COMPREHENSIVE METABOLIC PANEL
ALT: 25 U/L (ref 0–53)
AST: 19 U/L (ref 0–37)
Albumin: 4.5 g/dL (ref 3.5–5.2)
Alkaline Phosphatase: 31 U/L — ABNORMAL LOW (ref 39–117)
BUN: 16 mg/dL (ref 6–23)
CO2: 28 mEq/L (ref 19–32)
Calcium: 9.4 mg/dL (ref 8.4–10.5)
Chloride: 102 mEq/L (ref 96–112)
Creatinine, Ser: 0.63 mg/dL (ref 0.40–1.50)
GFR: 98.11 mL/min (ref >60.00)
Glucose, Bld: 147 mg/dL — ABNORMAL HIGH (ref 70–99)
Potassium: 4 mEq/L (ref 3.5–5.1)
Sodium: 137 mEq/L (ref 135–145)
Total Bilirubin: 0.6 mg/dL (ref 0.2–1.2)
Total Protein: 7.3 g/dL (ref 6.0–8.3)

## 2020-06-17 LAB — GLUCOSE, POCT (MANUAL RESULT ENTRY): POC Glucose: 213 mg/dl — AB (ref 70–99)

## 2020-06-17 LAB — LIPID PANEL
Cholesterol: 155 mg/dL (ref 0–200)
HDL: 36.4 mg/dL — ABNORMAL LOW
LDL Cholesterol: 85 mg/dL (ref 0–99)
NonHDL: 118.73
Total CHOL/HDL Ratio: 4
Triglycerides: 170 mg/dL — ABNORMAL HIGH (ref 0.0–149.0)
VLDL: 34 mg/dL (ref 0.0–40.0)

## 2020-06-17 LAB — POCT GLYCOSYLATED HEMOGLOBIN (HGB A1C): Hemoglobin A1C: 7.2 % — AB (ref 4.0–5.6)

## 2020-06-17 NOTE — Patient Instructions (Addendum)
Humulin R 30 min before meal  Check blood sugars on waking up 3-4 days a week  Also check blood sugars about 2 hours after meals and do this after different meals by rotation  Recommended blood sugar levels on waking up are 90-130 and about 2 hours after meal is 130-180  Please bring your blood sugar monitor to each visit, thank you

## 2020-06-17 NOTE — Progress Notes (Signed)
Kidney test, potassium okay.  Triglycerides still slightly high, need to take fish oil 2 capsules daily instead of 1.  Forwarding to cardiologist also with note that blood pressure is now controlled with adding HCTZ

## 2020-06-17 NOTE — Progress Notes (Signed)
Patient ID: [name] [name], male   DOB: [birth year], 69 y.o.   MRN: [id number]           Reason for Appointment: Follow-up for Type 2 Diabetes    History of Present Illness:          Date of diagnosis of type 2 diabetes mellitus: 2011        Background history:  He was probably started on metformin at diagnosis and subsequently given Amaryl also Subsequently had been on Janumet and more recently this was changed to [facility] Because of poor control he was given Levemir in addition to his Kombiglyze in 01/2013 The dose was progressively increased from initial dose of 12 units He did have somewhat better control right after starting insulin with A1c below 7% but only one time His blood sugars had been consistently high since 2014 with A1c at least 8%  Recent history:   INSULIN regimen is described as: [name] 136 units daily.   Humulin R U-500, 10 units at breakfast, 15 at lunch, and 25-30 at dinner  Non-insulin hypoglycemic drugs the patient is taking are: Metformin ER 2 g, Victoza 1.8 mg daily     A1c has been as low as 6.8, now 7.2 .  Current blood sugar patterns, management and problems identified:  He has been taking his U-500 regular insulin with his meals or after meals and did not follow instructions as discussed before  He says some of his readings have been high because of periodically getting prednisone Dosepaks  However despite reminders he is only randomly checking his blood sugars in the evenings with only 3 recent readings which are all high  Also his morning sugars have been fluctuating partly from the steroid use previously; today his blood sugar was 213 late morning without any insulin or food  He thinks his morning sugars were better and he had reduced his [name] down to 90 units but more recently has been progressively increasing the dose and is back up to the 136 units recommended  No hypoglycemia with lowest blood sugar 122  Overall he thinks he  is still trying to watch his diet better and is losing some weight  However still not able to do any regular walking or exercise  His weight is down 6 pounds    Side effects from medications have been: ?  Dizziness from Invokana  Hypoglycemia: None    Glucose monitoring:  done about once  in a day         Glucometer: One Touch Verio    Blood Glucose readings from monitor with averages:   His meter has the wrong date and time programmed  PRE-MEAL Fasting Lunch Dinner  8-9 PM Overall  Glucose range:  122-248  164, 261   277-404   Mean/median:  178    192   Previous data:  PRE-MEAL Fasting Lunch Dinner Bedtime Overall  Glucose range:  110-164    174-301   Mean/median:  129     175     Self-care: The diet that the patient has been following is: tries to limit sweet drinks.      Meal times: Breakfast: 9 AM Lunch: 1 PM Dinner: 6 PM   Typical meal intake: Breakfast can occasionally be a fast food biscuit, lunch is a sandwich or vegetables, variable meal at suppertime.                Dietician visit, most recent: 6/16  Weight history: Highest  previously 256  Wt Readings from Last 3 Encounters:  06/17/20 212 lb (96.2 kg)  05/22/20 206 lb (93.4 kg)  03/11/20 218 lb 3.2 oz (99 kg)    Glycemic control:   Lab Results  Component Value Date   HGBA1C 7.2 (A) 06/17/2020   HGBA1C 8.5 (H) 12/27/2019   HGBA1C 7.7 (A) 03/01/2019   Lab Results  Component Value Date   MICROALBUR 2.0 (H) 12/27/2019   LDLCALC 64 04/25/2014   CREATININE 0.67 12/27/2019    Other active problems: See review of systems    Allergies as of 06/17/2020      Reactions   Rosuvastatin Other (See Comments)   REACTION: myalgias      Medication List       Accurate as of June 17, 2020  4:26 PM. If you have any questions, ask your nurse or doctor.        aspirin 81 MG tablet Take 81 mg by mouth daily.   atorvastatin 20 MG tablet Commonly known as: LIPITOR TAKE 1 TABLET(20 MG) BY MOUTH  DAILY   fenofibrate micronized 200 MG capsule Commonly known as: LOFIBRA TAKE 1 CAPSULE BY MOUTH EVERY DAY   glucose blood test strip USE TO TEST BLOOD SUGAR TWICE DAILY DX CODE E11.65   OneTouch Verio test strip Generic drug: glucose blood USE AS DIRECTED TO TEST SUGAR ONCE DAILY   HumuLIN R U-500 KwikPen 500 UNIT/ML kwikpen Generic drug: insulin regular human CONCENTRATED Inject 20 units under the skin before breakfast, 30 at lunch, and 35 at dinner. May go up or down by 5 units based on meal size.   hydrochlorothiazide 25 MG tablet Commonly known as: HYDRODIURIL Take 1 tablet (25 mg total) by mouth daily.   metFORMIN 500 MG 24 hr tablet Commonly known as: GLUCOPHAGE-XR TAKE 4 TABLETS BY MOUTH DAILY WITH SUPPER   NovoFine 32G X 6 MM Misc Generic drug: Insulin Pen Needle USE TO INJECT INSULIN TWICE DAILY.   Omega 3 1000 MG Caps Take 1,000 mg by mouth daily. TAKE 1 TABLET BY MOUTH ONCE DAILY.   OneTouch Delica Plus Lancet30G Misc USE ONCE DAILY TO TEST BLOOD SUGAR   ramipril 5 MG capsule Commonly known as: ALTACE Take 1 capsule (5 mg total) by mouth 2 (two) times daily.   Evaristo Bury FlexTouch 200 UNIT/ML FlexTouch Pen Generic drug: insulin degludec INJECT 136 UNITS UNDER THE SKIN ONCE DAILY   Victoza 18 MG/3ML Sopn Generic drug: liraglutide ADMINISTER 1.8 MG UNDER THE SKIN AT THE SAME TIME EVERY DAY       Allergies:  Allergies  Allergen Reactions  . Rosuvastatin Other (See Comments)    REACTION: myalgias    Past Medical History:  Diagnosis Date  . Aortic valve sclerosis    echo june 2009  . CAD (coronary artery disease)    a. 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 b. s/p CABG 04/2011.  . Carotid artery disease (HCC)    Doppler, preop, September, 2012,  mild bilateral plaque with no significant stenoses  . Diabetes mellitus   . Dizziness    June, 2013  . Ejection fraction    a. Ejection fraction 60%, echo, June,  2009. b. EF preserved by cath 2012.  [name] Food poisoning    age 43  . GERD (gastroesophageal reflux disease)   . Hx of CABG    [name],  x4  May 01, 2011 limited to the LAD, left radial artery to the OM, SVG  to diagonal, SVG to posterior descending  . Hyperlipidemia   . Hypertension   . Muscle ache    on Crestor  09/04/09 CPK 193(7-232) tolerated simvastatin in the past  . PVC (premature ventricular contraction) 06/2009   November, 2010  . Rash     Past Surgical History:  Procedure Laterality Date  . BACK SURGERY    . coccyx bone removed    . CORONARY ARTERY BYPASS GRAFT  05/01/2011   CABG x4 with LIMA to LAD, LRA to OM, SVG to D1, SVG to PDA, EVH via left thigh  . Intraoperative transesophageal echocardiography.  05/01/2011  . pilonidal cystectomy      Family History  Problem Relation Age of Onset  . Heart attack Paternal Grandfather        late 5360s  . Hearing loss Paternal Grandfather   . Hypertension Father   . Heart attack Father 65       bypass  . Diabetes Father   . Hearing loss Father   . Thyroid disease Father   . Cancer Maternal Grandfather        site unknown  . Prostate cancer Maternal Grandfather   . Diabetes Paternal Aunt        X 3  . Heart failure Maternal Grandmother   . Diabetes Mother   . Diabetes Paternal Uncle   . Stroke Neg Hx     Social History:  reports that he has never smoked. He has never used smokeless tobacco. He reports that he does not drink alcohol and does not use drugs.    Review of Systems   Lipid history: He has been taking fish oil for significantly high triglycerides along with his Lipitor 20 mg  Triglycerides have been consistently high; his insurance will not cover Vascepa or Lovaza Currently on fenofibrate and OTC omega-3 fish oil 1/day  Last triglycerides 208 fasting    Lab Results  Component Value Date   CHOL 117 12/27/2019   HDL 29.60 (L) 12/27/2019   LDLCALC 64 04/25/2014   LDLDIRECT 63.0 12/27/2019   TRIG  208.0 (H) 12/27/2019   CHOLHDL 4 12/27/2019            THYROID: He has a 2.4 cm dominant left-sided thyroid nodule on Ultrasound in 12/2014  Follow-up showed the same size of the nodules  TSH was 1.6 done by PCP in 06/2019   Lab Results  Component Value Date   TSH 2.11 04/28/2017     HYPERTENSION:  on  10 mg ramipril using 5 mg twice daily  Blood pressure has been higher this year Periodically checking at home and his systolic readings are mostly around 150 He was seen by the cardiologist about 3 weeks ago and because of high blood pressure is now taking HCTZ 25 mg daily  BP Readings from Last 3 Encounters:  06/17/20 130/74  05/22/20 (!) 168/80  03/11/20 (!) 170/82     LABS:  Office Visit on 06/17/2020  Component Date Value Ref Range Status  . Hemoglobin A1C 06/17/2020 7.2* 4.0 - 5.6 % Final  . POC Glucose 06/17/2020 213* 70 - 99 mg/dl Final    Physical Examination:  BP 130/74 (Patient Position: Standing)   Pulse 80   Ht 6' (1.829 m)   Wt 212 lb (96.2 kg)   SpO2 98%   BMI 28.75 kg/m     ASSESSMENT:  Diabetes type 2 on insulin     See history of present illness for detailed discussion of  current management, blood sugar patterns and problems identified  His A1c is better than usual at 7.2  He is on basal bolus insulin and Victoza with Metformin  His blood sugars recently have been quite inconsistent and trending higher possibly from some use of prednisone, recent average blood sugar at home 192 which is mostly fasting blood sugar testing Unclear what his postprandial readings are but they likely are high from the occasional readings that he has done As above discussed that he is not timing his Humulin R appropriately as well as not adjusting it based on his blood sugar results Still continues to think his insulin needs to be adjusted based on fasting readings only However with relatively better diet his weight is still coming down slightly  HYPERTENSION:  Blood pressure is much better with adding HCTZ However will need follow-up renal functions and potassium level   HYPERTRIGLYCERIDEMIA: Needs fasting labs today and will adjust medications as needed  PLAN:   He was reminded to check his blood sugars at least once a day after one of his meals to help adjust his Humulin R  Currently not able to adjust the dose of Humulin R based on sporadic readings but likely may need higher dose  Also will benefit from taking the insulin 30-minute before his planned meals at all times including in the morning  Discussed postprandial blood sugar targets  Also may need to adjust the dose up or down 5 units based on amounts of carbohydrates  No change in [name] unless morning sugars are consistently out of the range of 90-130  Start walking for exercise when able to  No change in Victoza  Influenza vaccine given  Patient to be called with lab results    Patient Instructions  Humulin R 30 min before meal  Check blood sugars on waking up 3-4 days a week  Also check blood sugars about 2 hours after meals and do this after different meals by rotation  Recommended blood sugar levels on waking up are 90-130 and about 2 hours after meal is 130-180  Please bring your blood sugar monitor to each visit, thank you         Reather Littler 06/17/2020, 4:26 PM   Note: This office note was prepared with Dragon voice recognition system technology. Any transcriptional errors that result from this process are unintentional.

## 2020-06-19 ENCOUNTER — Telehealth: Payer: Self-pay | Admitting: Pharmacist

## 2020-06-19 MED ORDER — ATORVASTATIN CALCIUM 40 MG PO TABS: ORAL_TABLET | ORAL | 3 refills | Status: DC

## 2020-06-19 MED ORDER — OMEGA-3-ACID ETHYL ESTERS 1 G PO CAPS
2.0000 g | ORAL_CAPSULE | Freq: Two times a day (BID) | ORAL | 3 refills | Status: DC
Start: 2020-06-19 — End: 2020-10-18

## 2020-06-19 MED ORDER — ICOSAPENT ETHYL 1 G PO CAPS: 2.0000 g | ORAL_CAPSULE | Freq: Two times a day (BID) | ORAL | 11 refills | Status: DC

## 2020-06-19 MED ORDER — OMEGA-3-ACID ETHYL ESTERS 1 G PO CAPS: 2.0000 g | ORAL_CAPSULE | Freq: Two times a day (BID) | ORAL | 11 refills | Status: DC

## 2020-06-19 NOTE — Telephone Encounter (Signed)
Pt takes atorvastatin 20mg  daily, fenofibrate 200mg  daily, and OTC fish oil 1,000mg  daily.   Lipid panel from 06/17/20: TC 155, TG 170, HDL 36.4, LDL 85  TG slightly elevated, LDL also above goal < 70. DM also contributing to elevated TG.   Rx sent to pharmacy for Vascepa and Lovaza to pharmacy to see what copays are; Vascepa would be preferred due to cardiovascular benefit.  Lovaza copay - $4 with coupon card Vascepa copay - not covered  Discussed with Dr . Will change OTC fish oil to Lovaza 2g BID and will also increase atorvastatin to 40mg  daily. Pt is aware of medication changes and will keep his follow up appt with Dr 06/19/20 this Friday. Has labs scheduled with PCP next February already.

## 2020-06-19 NOTE — Telephone Encounter (Signed)
[name] Bathe, MD  Clyda Smyth, Emeline Darling, RPH-CPP Would you mind seeing if Lovaza would be a good option for him.  Thanks.   Donato Schultz, MD

## 2020-06-19 NOTE — Telephone Encounter (Signed)
Thank you, agree with plan Haidyn Chadderdon, MD  

## 2020-06-21 ENCOUNTER — Encounter: Payer: Self-pay | Admitting: Cardiology

## 2020-06-21 ENCOUNTER — Other Ambulatory Visit: Payer: Self-pay

## 2020-06-21 ENCOUNTER — Ambulatory Visit: Payer: Medicare HMO | Admitting: Cardiology

## 2020-06-21 VITALS — BP 136/70 | HR 83 | Ht 68.0 in | Wt 214.0 lb

## 2020-06-21 DIAGNOSIS — I2510 Atherosclerotic heart disease of native coronary artery without angina pectoris: Secondary | ICD-10-CM | POA: Diagnosis not present

## 2020-06-21 DIAGNOSIS — I2583 Coronary atherosclerosis due to lipid rich plaque: Secondary | ICD-10-CM

## 2020-06-21 DIAGNOSIS — I779 Disorder of arteries and arterioles, unspecified: Secondary | ICD-10-CM

## 2020-06-21 DIAGNOSIS — IMO0002 Reserved for concepts with insufficient information to code with codable children: Secondary | ICD-10-CM

## 2020-06-21 DIAGNOSIS — E1165 Type 2 diabetes mellitus with hyperglycemia: Secondary | ICD-10-CM

## 2020-06-21 DIAGNOSIS — E782 Mixed hyperlipidemia: Secondary | ICD-10-CM

## 2020-06-21 DIAGNOSIS — E1151 Type 2 diabetes mellitus with diabetic peripheral angiopathy without gangrene: Secondary | ICD-10-CM | POA: Diagnosis not present

## 2020-06-21 NOTE — Progress Notes (Signed)
Cardiology Office Note:    Date:  06/21/2020   ID:  LATRAVIS GRINE, DOB 10/14/1950, MRN [id number]  PCP:  Creola Corn, MD  [facility] Cardiologist:  Donato Schultz, MD  [facility] Electrophysiologist:  None   Referring MD: Creola Corn, MD   History of Present Illness:    [name] [name] is a 69 y.o. male here for follow-up of CAD post CABG. Also had PCI to his RCA prior to his bypass.  Retired Emergency planning/management officer.  Has been having back issues.  Steroid injections.  Previously was having some hypertension in response to pain.  Today looks reasonable.  Started H CTZ last office visit in October 2021.  Denies any fevers chills nausea vomiting syncope bleeding.  Past Medical History:  Diagnosis Date  . Aortic valve sclerosis    echo june 2009  . CAD (coronary artery disease)    a. 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 b. s/p CABG 04/2011.  . Carotid artery disease (HCC)    Doppler, preop, September, 2012,  mild bilateral plaque with no significant stenoses  . Diabetes mellitus   . Dizziness    June, 2013  . Ejection fraction    a. Ejection fraction 60%, echo, June, 2009. b. EF preserved by cath 2012.  [name] Food poisoning    age 17  . GERD (gastroesophageal reflux disease)   . Hx of CABG    [name] [name],  x4  May 01, 2011 limited to the LAD, left radial artery to the OM, SVG to diagonal, SVG to posterior descending  . Hyperlipidemia   . Hypertension   . Muscle ache    on Crestor  09/04/09 CPK 193(7-232) tolerated simvastatin in the past  . PVC (premature ventricular contraction) 06/2009   November, 2010  . Rash     Past Surgical History:  Procedure Laterality Date  . BACK SURGERY    . coccyx bone removed    . CORONARY ARTERY BYPASS GRAFT  05/01/2011   CABG x4 with LIMA to LAD, LRA to OM, SVG to D1, SVG to PDA, EVH via left thigh  . Intraoperative transesophageal echocardiography.  05/01/2011  . pilonidal cystectomy       Current Medications: Current Meds  Medication Sig  . aspirin 81 MG tablet Take 81 mg by mouth daily.    [name] atorvastatin (LIPITOR) 40 MG tablet Take 1 tablet by mouth once a day.  . fenofibrate micronized (LOFIBRA) 200 MG capsule TAKE 1 CAPSULE BY MOUTH EVERY DAY  . glucose blood (ONETOUCH VERIO) test strip USE AS DIRECTED TO TEST SUGAR ONCE DAILY  . glucose blood test strip USE TO TEST BLOOD SUGAR TWICE DAILY DX CODE E11.65  . hydrochlorothiazide (HYDRODIURIL) 25 MG tablet Take 1 tablet (25 mg total) by mouth daily.  . insulin regular human CONCENTRATED (HUMULIN R U-500 KWIKPEN) 500 UNIT/ML kwikpen Inject 20 units under the skin before breakfast, 30 at lunch, and 35 at dinner. May go up or down by 5 units based on meal size.  . Lancets (ONETOUCH DELICA PLUS LANCET30G) MISC USE ONCE DAILY TO TEST BLOOD SUGAR  . metFORMIN (GLUCOPHAGE-XR) 500 MG 24 hr tablet TAKE 4 TABLETS BY MOUTH DAILY WITH SUPPER  . NOVOFINE 32G X 6 MM MISC USE TO INJECT INSULIN TWICE DAILY.  [name] omega-3 acid ethyl esters (LOVAZA) 1 g capsule Take 2 capsules (2 g total) by mouth 2 (two) times daily.  . ramipril (ALTACE) 5 MG capsule Take 1  capsule (5 mg total) by mouth 2 (two) times daily.  . TRESIBA FLEXTOUCH 200 UNIT/ML FlexTouch Pen INJECT 136 UNITS UNDER THE SKIN ONCE DAILY  . VICTOZA 18 MG/3ML SOPN ADMINISTER 1.8 MG UNDER THE SKIN AT THE SAME TIME EVERY DAY     Allergies:   Rosuvastatin   Social History   Socioeconomic History  . Marital status: Single    Spouse name: Not on file  . Number of children: Not on file  . Years of education: Not on file  . Highest education level: Not on file  Occupational History  . Occupation: Emergency planning/management officer   Tobacco Use  . Smoking status: Never Smoker  . Smokeless tobacco: Never Used  Substance and Sexual Activity  . Alcohol use: No  . Drug use: No  . Sexual activity: Not on file  Other Topics Concern  . Not on file  Social History Narrative  . Not on file   Social  Determinants of Health   Financial Resource Strain:   . Difficulty of Paying Living Expenses: Not on file  Food Insecurity:   . Worried About Programme researcher, broadcasting/film/video in the Last Year: Not on file  . Ran Out of Food in the Last Year: Not on file  Transportation Needs:   . Lack of Transportation (Medical): Not on file  . Lack of Transportation (Non-Medical): Not on file  Physical Activity:   . Days of Exercise per Week: Not on file  . Minutes of Exercise per Session: Not on file  Stress:   . Feeling of Stress : Not on file  Social Connections:   . Frequency of Communication with Friends and Family: Not on file  . Frequency of Social Gatherings with Friends and Family: Not on file  . Attends Religious Services: Not on file  . Active Member of Clubs or Organizations: Not on file  . Attends Banker Meetings: Not on file  . Marital Status: Not on file     Family History: The patient's family history includes Cancer in his maternal grandfather; Diabetes in his father, mother, paternal aunt, and paternal uncle; Hearing loss in his father and paternal grandfather; Heart attack in his paternal grandfather; Heart attack (age of onset: 94) in his father; Heart failure in his maternal grandmother; Hypertension in his father; Prostate cancer in his maternal grandfather; Thyroid disease in his father. There is no history of Stroke.  ROS:   Please see the history of present illness.     All other systems reviewed and are negative.  Recent Labs: 06/17/2020: ALT 25; BUN 16; Creatinine, Ser 0.63; Potassium 4.0; Sodium 137  Recent Lipid Panel    Component Value Date/Time   CHOL 155 06/17/2020 1154   TRIG 170.0 (H) 06/17/2020 1154   HDL 36.40 (L) 06/17/2020 1154   CHOLHDL 4 06/17/2020 1154   VLDL 34.0 06/17/2020 1154   LDLCALC 85 06/17/2020 1154   LDLDIRECT 63.0 12/27/2019 0907    Physical Exam:    VS:  BP 136/70   Pulse 83   Ht 5\' 8"  (1.727 m)   Wt 214 lb (97.1 kg)   SpO2 97%    BMI 32.54 kg/m     Wt Readings from Last 3 Encounters:  06/21/20 214 lb (97.1 kg)  06/17/20 212 lb (96.2 kg)  05/22/20 206 lb (93.4 kg)     GEN:  Well nourished, well developed in no acute distress HEENT: Normal NECK: No JVD; No carotid bruits LYMPHATICS: No lymphadenopathy CARDIAC:  RRR, no murmurs, rubs, gallops RESPIRATORY:  Clear to auscultation without rales, wheezing or rhonchi  ABDOMEN: Soft, non-tender, non-distended MUSCULOSKELETAL:  No edema; No deformity  SKIN: Warm and dry NEUROLOGIC:  Alert and oriented x 3 PSYCHIATRIC:  Normal affect   ASSESSMENT:    1. Coronary artery disease due to lipid rich plaque   2. Mixed hyperlipidemia   3. Carotid artery disease, unspecified laterality (HCC)   4. Type II diabetes mellitus with peripheral circulatory disorder, uncontrolled (HCC)    PLAN:    In order of problems listed above:  CAD post CABG -Overall doing well without any anginal symptoms -Continue with aspirin atorvastatin, ACE inhibitor.  Diabetes with hypertension -Last hemoglobin A1c 7.2 excellent. -HCTZ added.  Continue with Altace 10.  Good for renal protection as well. -At one point blood pressure was quite low.  Keep an eye on this. -He showed me several values.  Blood pressure is under better control.  Blood work reviewed, creatinine stable. Dr. Lucianne Muss  Hyperlipidemia -Atorvastatin 40 mg once a day-LDL last 85.  Would encourage less than 70.  Previous LDL was 63.  Continue to monitor.  Carotid artery plaque -Mild-father had carotid endarterectomy.  Continue with statin, aspirin.    Medication Adjustments/Labs and Tests Ordered: Current medicines are reviewed at length with the patient today.  Concerns regarding medicines are outlined above.  No orders of the defined types were placed in this encounter.  No orders of the defined types were placed in this encounter.   Patient Instructions  Medication Instructions:  The current medical regimen is  effective;  continue present plan and medications.  *If you need a refill on your cardiac medications before your next appointment, please call your pharmacy*  Follow-Up: At [facility], you and your health needs are our priority.  As part of our continuing mission to provide you with exceptional heart care, we have created designated Provider Care Teams.  These Care Teams include your primary Cardiologist (physician) and Advanced Practice Providers (APPs -  Physician Assistants and Nurse Practitioners) who all work together to provide you with the care you need, when you need it.  We recommend signing up for the patient portal called "MyChart".  Sign up information is provided on this After Visit Summary.  MyChart is used to connect with patients for Virtual Visits (Telemedicine).  Patients are able to view lab/test results, encounter notes, upcoming appointments, etc.  Non-urgent messages can be sent to your provider as well.   To learn more about what you can do with MyChart, go to [email or web address].    Your next appointment:   6 month(s)  The format for your next appointment:   In Person  Provider:   Donato Schultz, MD   Thank you for choosing [facility]!!         Signed, Donato Schultz, MD  06/21/2020 4:03 PM    [facility]

## 2020-06-21 NOTE — Patient Instructions (Signed)

## 2020-07-18 ENCOUNTER — Encounter: Payer: Self-pay | Admitting: Gastroenterology

## 2020-08-06 ENCOUNTER — Other Ambulatory Visit: Payer: Self-pay | Admitting: Endocrinology

## 2020-08-15 ENCOUNTER — Other Ambulatory Visit: Payer: Self-pay | Admitting: Endocrinology

## 2020-08-21 ENCOUNTER — Other Ambulatory Visit: Payer: Self-pay | Admitting: Endocrinology

## 2020-08-23 ENCOUNTER — Other Ambulatory Visit: Payer: Self-pay

## 2020-08-23 ENCOUNTER — Ambulatory Visit (AMBULATORY_SURGERY_CENTER): Payer: Medicare HMO | Admitting: *Deleted

## 2020-08-23 VITALS — Ht 68.0 in | Wt 208.0 lb

## 2020-08-23 DIAGNOSIS — Z86010 Personal history of colonic polyps: Secondary | ICD-10-CM

## 2020-08-23 MED ORDER — PEG 3350-KCL-NA BICARB-NACL 420 G PO SOLR: 4000.0000 mL | Freq: Once | ORAL | 0 refills | Status: AC

## 2020-08-23 NOTE — Progress Notes (Signed)
Pt verified name, DOB, address and insurance during PV today. Pt mailed instruction packet to included paper to complete and mail back to [facility] with addressed and stamped envelope, Emmi video, copy of consent form to read and not return, and instructions. PV completed over the phone. Pt encouraged to call with questions or issues   No egg or soy allergy known to patient  No issues with past sedation with any surgeries or procedures No intubation problems in the past  No FH of Malignant Hyperthermia No diet pills per patient No home 02 use per patient  No blood thinners per patient  Pt denies issues with constipation  No A fib or A flutter  EMMI video to pt or via MyChart  COVID 19 guidelines implemented in PV today with Pt and RN  Pt is fully vaccinated  for Covid + booster    Due to the COVID-19 pandemic we are asking patients to follow certain guidelines.  Pt aware of COVID protocols and [facility] guidelines

## 2020-08-28 ENCOUNTER — Encounter: Payer: Self-pay | Admitting: Gastroenterology

## 2020-09-06 ENCOUNTER — Encounter: Payer: Medicare HMO | Admitting: Gastroenterology

## 2020-09-15 ENCOUNTER — Other Ambulatory Visit: Payer: Self-pay | Admitting: Endocrinology

## 2020-09-21 ENCOUNTER — Other Ambulatory Visit: Payer: Self-pay | Admitting: Endocrinology

## 2020-09-21 DIAGNOSIS — E1165 Type 2 diabetes mellitus with hyperglycemia: Secondary | ICD-10-CM

## 2020-09-24 ENCOUNTER — Other Ambulatory Visit (INDEPENDENT_AMBULATORY_CARE_PROVIDER_SITE_OTHER): Payer: Medicare HMO

## 2020-09-24 ENCOUNTER — Encounter: Payer: Self-pay | Admitting: Endocrinology

## 2020-09-24 ENCOUNTER — Ambulatory Visit: Payer: Medicare HMO | Admitting: Endocrinology

## 2020-09-24 ENCOUNTER — Other Ambulatory Visit: Payer: Self-pay

## 2020-09-24 ENCOUNTER — Other Ambulatory Visit: Payer: Self-pay | Admitting: *Deleted

## 2020-09-24 VITALS — BP 128/86 | HR 75 | Ht 68.0 in | Wt 221.8 lb

## 2020-09-24 DIAGNOSIS — I10 Essential (primary) hypertension: Secondary | ICD-10-CM

## 2020-09-24 DIAGNOSIS — E1165 Type 2 diabetes mellitus with hyperglycemia: Secondary | ICD-10-CM

## 2020-09-24 DIAGNOSIS — E782 Mixed hyperlipidemia: Secondary | ICD-10-CM | POA: Diagnosis not present

## 2020-09-24 DIAGNOSIS — Z794 Long term (current) use of insulin: Secondary | ICD-10-CM

## 2020-09-24 LAB — BASIC METABOLIC PANEL
BUN: 13 mg/dL (ref 6–23)
CO2: 27 mEq/L (ref 19–32)
Calcium: 9.3 mg/dL (ref 8.4–10.5)
Chloride: 100 mEq/L (ref 96–112)
Creatinine, Ser: 0.68 mg/dL (ref 0.40–1.50)
GFR: 95.69 mL/min (ref >60.00)
Glucose, Bld: 183 mg/dL — ABNORMAL HIGH (ref 70–99)
Potassium: 3.7 mEq/L (ref 3.5–5.1)
Sodium: 136 mEq/L (ref 135–145)

## 2020-09-24 LAB — MICROALBUMIN / CREATININE URINE RATIO
Creatinine,U: 90.7 mg/dL
Microalb Creat Ratio: 2 mg/g (ref 0.0–30.0)
Microalb, Ur: 1.8 mg/dL (ref 0.0–1.9)

## 2020-09-24 LAB — POCT GLYCOSYLATED HEMOGLOBIN (HGB A1C): Hemoglobin A1C: 8 % — AB (ref 4.0–5.6)

## 2020-09-24 MED ORDER — DEXCOM G6 TRANSMITTER MISC: 1.0000 | Freq: Once | 1 refills | Status: AC

## 2020-09-24 MED ORDER — DEXCOM G6 SENSOR MISC: 3 refills | Status: DC

## 2020-09-24 MED ORDER — DEXCOM G6 TRANSMITTER MISC: 1.0000 | Freq: Once | 1 refills | Status: DC

## 2020-09-24 MED ORDER — DAPAGLIFLOZIN PROPANEDIOL 10 MG PO TABS: 10.0000 mg | ORAL_TABLET | Freq: Every day | ORAL | 1 refills | Status: DC

## 2020-09-24 NOTE — Patient Instructions (Addendum)
Tresiba 144 units daily.   Humulin R U-500, 25 units at breakfast, 45 at lunch, and 40-55 at dinner  With 1/2 Farxiga reduce ramipirl to 1

## 2020-09-24 NOTE — Progress Notes (Signed)
Patient ID: [name] [name], male   DOB: Oct 07, 1950, 70 y.o.   MRN: [id number]           Reason for Appointment: Follow-up for Type 2 Diabetes    History of Present Illness:          Date of diagnosis of type 2 diabetes mellitus: 2011        Background history:  He was probably started on metformin at diagnosis and subsequently given Amaryl also Subsequently had been on Janumet and more recently this was changed [facility]. Because of poor control he was given Levemir in addition to his Kombiglyze in 01/2013 The dose was progressively increased from initial dose of 12 units He did have somewhat better control right after starting insulin with A1c below 7% but only one time His blood sugars had been consistently high since 2014 with A1c at least 8%  Recent history:   INSULIN regimen is described as: [place]iba 144 units daily.   Humulin R U-500, 20units at breakfast, 30at lunch, and 40 at dinner  Non-insulin hypoglycemic drugs the patient is taking are: Metformin ER 2 g, Victoza 1.8 mg daily    A1c has been as low as 6.8, now 8%   Current blood sugar patterns, management and problems identified:  He has been checking his blood sugars mostly in the mornings despite reminders to check readings after meals  He said that he did not get the freestyle libre or look into it as discussed before  Also he was having relatively better blood sugars for about a week last month they are mostly high and nearly 200 average in the morning  Also appears to have higher readings later in the day  Blood sugars are also higher in the last week after an epidural steroid on 2/16  He appears to have gone up on his insulin doses including the Humulin R especially in the last week without clear benefit  No difficulty taking Victoza daily  Again still not able to do any regular walking or exercise  His weight is up about 7 pounds   Side effects from medications have been: ?  Dizziness from  Invokana  Hypoglycemia: None    Glucose monitoring:  done about once  in a day         Glucometer: One Touch Verio    Blood Glucose readings from monitor with averages:    PRE-MEAL Fasting Lunch Dinner Bedtime Overall  Glucose range:  130-231      Mean/median:  190    194   POST-MEAL PC Breakfast PC Lunch PC Dinner  Glucose range:    207, 291  Mean/median:      Previously  PRE-MEAL Fasting Lunch Dinner  8-9 PM Overall  Glucose range:  122-248  164, 261   277-404   Mean/median:  178    192    Self-care: The diet that the patient has been following is: tries to limit sweet drinks.      Meal times: Breakfast: 9 AM Lunch: 1 PM Dinner: 6 PM   Typical meal intake: Breakfast can occasionally be a fast food biscuit, lunch is a sandwich or vegetables, variable meal at suppertime.                Dietician visit, most recent: 6/16  Weight history: Highest  previously 256  Wt Readings from Last 3 Encounters:  09/24/20 221 lb 12.8 oz (100.6 kg)  08/23/20 208 lb (94.3 kg)  06/21/20 214 lb (  97.1 kg)    Glycemic control:   Lab Results  Component Value Date   HGBA1C 8.0 (A) 09/24/2020   HGBA1C 7.2 (A) 06/17/2020   HGBA1C 8.5 (H) 12/27/2019   Lab Results  Component Value Date   MICROALBUR 2.0 (H) 12/27/2019   LDLCALC 85 06/17/2020   CREATININE 0.63 06/17/2020    Other active problems: See review of systems    Allergies as of 09/24/2020      Reactions   Rosuvastatin Other (See Comments)   REACTION: myalgias      Medication List       Accurate as of September 24, 2020 10:16 AM. If you have any questions, ask your nurse or doctor.        amoxicillin 500 MG capsule Commonly known as: AMOXIL Take by mouth 4 (four) times daily.   aspirin 81 MG tablet Take 81 mg by mouth daily.   atorvastatin 40 MG tablet Commonly known as: LIPITOR Take 1 tablet by mouth once a day.   dapagliflozin propanediol 10 MG Tabs tablet Commonly known as: Farxiga Take 1 tablet (10 mg  total) by mouth daily. Started by: Reather Littler, MD   Dexcom G6 Sensor Misc Use to monitor blood sugar, change after 10 days Started by: Valora Piccolo, CMA   Dexcom G6 Transmitter Misc 1 Device by Does not apply route once for 1 dose. Started by: Valora Piccolo, CMA   fenofibrate micronized 200 MG capsule Commonly known as: LOFIBRA TAKE 1 CAPSULE BY MOUTH EVERY DAY   gabapentin 100 MG capsule Commonly known as: NEURONTIN Take 100 mg by mouth at bedtime.   glucose blood test strip USE TO TEST BLOOD SUGAR TWICE DAILY DX CODE E11.65   OneTouch Verio test strip Generic drug: glucose blood USE AS DIRECTED TO TEST SUGAR ONCE DAILY   HumuLIN R U-500 KwikPen 500 UNIT/ML kwikpen Generic drug: insulin regular human CONCENTRATED INJECT 20 UNITS UNDER THE SKIN BEFORE BREAKFAST, 30 UNITS AT LUNCH, AND 35 UNITS AT DINNER. MAY GO UP OR DOWN BY 5 UNITS BASED ON MEAL SIZE   hydrochlorothiazide 25 MG tablet Commonly known as: HYDRODIURIL Take 1 tablet (25 mg total) by mouth daily.   metFORMIN 500 MG 24 hr tablet Commonly known as: GLUCOPHAGE-XR TAKE 4 TABLETS BY MOUTH DAILY WITH SUPPER   Novofine Pen Needle 32G X 6 MM Misc Generic drug: Insulin Pen Needle USE TO INJECT INSULIN TWICE DAILY.   omega-3 acid ethyl esters 1 g capsule Commonly known as: Lovaza Take 2 capsules (2 g total) by mouth 2 (two) times daily.   OneTouch Delica Plus Lancet30G Misc USE ONCE DAILY TO TEST BLOOD SUGAR   ramipril 5 MG capsule Commonly known as: ALTACE Take 1 capsule (5 mg total) by mouth 2 (two) times daily.   Evaristo Bury FlexTouch 200 UNIT/ML FlexTouch Pen Generic drug: insulin degludec INJECT 136 UNITS UNDER THE SKIN ONCE DAILY What changed: See the new instructions.   Victoza 18 MG/3ML Sopn Generic drug: liraglutide ADMINISTER 1.8 MG UNDER THE SKIN AT THE SAME TIME EVERY DAY       Allergies:  Allergies  Allergen Reactions  . Rosuvastatin Other (See Comments)    REACTION: myalgias     Past Medical History:  Diagnosis Date  . Allergy    1-2 x a year   . Aortic valve sclerosis    echo june 2009  . Arthritis    fingers, back   . CAD (coronary artery disease)    a. s/p DES to  distal RCA (anomalous origin, arises from left coronary cusp) 2004. b. s/p CABG 04/2011.  . Carotid artery disease (HCC)    Doppler, preop, September, 2012,  mild bilateral plaque with no significant stenoses  . Diabetes mellitus   . Dizziness    June, 2013  . Ejection fraction    a. Ejection fraction 60%, echo, June, 2009. b. EF preserved by cath 2012.  [name] Food poisoning    age 87  . GERD (gastroesophageal reflux disease)    past hx   . Hx of CABG    [name],  x4  May 01, 2011 limited to the LAD, left radial artery to the OM, SVG to diagonal, SVG to posterior descending  . Hyperlipidemia    on meds   . Hypertension    controlled   . Muscle ache    on Crestor  09/04/09 CPK 193(7-232) tolerated simvastatin in the past  . 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 PVC (premature ventricular contraction) 06/2009   November, 2010  . Rash     Past Surgical History:  Procedure Laterality Date  . BACK SURGERY    . coccyx bone removed    . COLONOSCOPY    . CORONARY ARTERY BYPASS GRAFT  05/01/2011   CABG x4 with LIMA to LAD, LRA to OM, SVG to D1, SVG to PDA, EVH via left thigh  . Intraoperative transesophageal echocardiography.  05/01/2011  . pilonidal cystectomy    . POLYPECTOMY    . TIBIA FRACTURE SURGERY      Family History  Problem Relation Age of Onset  . Heart attack Paternal Grandfather        late 77s  . Hearing loss Paternal Grandfather   . Hypertension Father   . Heart attack Father 65       bypass  . Diabetes Father   . Hearing loss Father   . Thyroid disease Father   . Cancer Maternal Grandfather        site unknown  . Prostate cancer Maternal Grandfather   . Diabetes Paternal Aunt        X 3  . Heart failure Maternal Grandmother   .  Diabetes Mother   . Diabetes Paternal Uncle   . Stroke Neg Hx   . Colon cancer Neg Hx   . Colon polyps Neg Hx   . Esophageal cancer Neg Hx   . Rectal cancer Neg Hx   . Stomach cancer Neg Hx     Social History:  reports that he has never smoked. He has never used smokeless tobacco. He reports that he does not drink alcohol and does not use drugs.    Review of Systems   Lipid history: He has been taking fish oil and fenofibrate for significantly high triglycerides along with his Lipitor 40 mg  Triglycerides have been mostly high; his insurance will not cover Vascepa or Lovaza Currently using OTC omega-3 fish oil 1/day    Lab Results  Component Value Date   CHOL 155 06/17/2020   HDL 36.40 (L) 06/17/2020   LDLCALC 85 06/17/2020   LDLDIRECT 63.0 12/27/2019   TRIG 170.0 (H) 06/17/2020   CHOLHDL 4 06/17/2020            THYROID: He has a 2.4 cm dominant left-sided thyroid nodule on Ultrasound in 12/2014  Follow-up showed the same size of the nodules  TSH was 1.6 done by PCP in 06/2019   Lab Results  Component Value  Date   TSH 2.11 04/28/2017     HYPERTENSION:  on  10 mg ramipril using 5 mg twice daily and also HCTZ 25 mg from his cardiologist  BP Readings from Last 3 Encounters:  09/24/20 128/86  06/21/20 136/70  06/17/20 130/74     LABS:  Office Visit on 09/24/2020  Component Date Value Ref Range Status  . Hemoglobin A1C 09/24/2020 8.0* 4.0 - 5.6 % Final    Physical Examination:  BP 128/86   Pulse 75   Ht 5\' 8"  (1.727 m)   Wt 221 lb 12.8 oz (100.6 kg)   SpO2 98%   BMI 33.72 kg/m     ASSESSMENT:  Diabetes type 2 on insulin     See history of present illness for detailed discussion of  current management, blood sugar patterns and problems identified  His A1c is up at 8%  He is on high doses of basal bolus insulin and Victoza with Metformin  Even with using U-500 insulin his blood sugars seem to be not as well controlled Relatively taking small  doses of mealtime insulin compared to basal insulin Difficult to assess his blood sugars because of lack of adequate postprandial monitoring also He thinks he can do better with diet Weight has gone up Fasting readings are now averaging 190 and only transiently better last month  HYPERTENSION: Blood pressure is currently fairly well controlled although diastolic higher today Previously no hypokalemia with 25 mg HCTZ   HYPERTRIGLYCERIDEMIA: Has only mild increase in triglycerides on the last visit On fenofibrate  PLAN:   He was advised to start using a continuous glucose monitor  To enable complete data collection since he forgets to check his sugar will use Dexcom instead of freestyle  Explained to him how this works and he can be using his smart phone for this  Likely will need instructions on starting this  Recommended consultation with dietitian  He will keep a 3-day food diary to take to the dietitian  Increase insulin doses as below  Trial of Farxiga 5 mg daily, currently this is the only SGLT2 covered by his insurance apparently Discussed action of SGLT 2 drugs on lowering glucose by decreasing kidney absorption of glucose, benefits of weight loss and lower blood pressure, possible side effects, 30-day trial coupon given  Along with this he will need to reduce his ramipril to 5 mg  To check blood pressure regularly at home  Need short-term follow-up   Tresiba 144 units daily.   Humulin R U-500, 25 units at breakfast, 45 at lunch, and 40-55 at dinner  With 1/2 Farxiga reduce ramipirl to 1  Patient Instructions  Tresiba 144 units daily.   Humulin R U-500, 25 units at breakfast, 45 at lunch, and 40-55 at dinner  With 1/2 Farxiga reduce ramipirl to 1       08-07-2002 09/24/2020, 10:16 AM   Note: This office note was prepared with Dragon voice recognition system technology. Any transcriptional errors that result from this process are  unintentional.

## 2020-10-08 ENCOUNTER — Ambulatory Visit (AMBULATORY_SURGERY_CENTER): Payer: Medicare HMO | Admitting: Gastroenterology

## 2020-10-08 ENCOUNTER — Other Ambulatory Visit: Payer: Self-pay

## 2020-10-08 ENCOUNTER — Encounter: Payer: Self-pay | Admitting: Gastroenterology

## 2020-10-08 VITALS — BP 139/69 | HR 65 | Temp 97.6°F | Resp 12 | Ht 68.0 in | Wt 215.0 lb

## 2020-10-08 DIAGNOSIS — Z86010 Personal history of colonic polyps: Secondary | ICD-10-CM

## 2020-10-08 MED ORDER — SODIUM CHLORIDE 0.9 % IV SOLN: 500.0000 mL | INTRAVENOUS | Status: AC

## 2020-10-08 NOTE — Progress Notes (Signed)
To PACU, VSS. Report to [email or web address] 

## 2020-10-08 NOTE — Patient Instructions (Signed)
Resume previous diet and medications.  YOU HAD AN ENDOSCOPIC PROCEDURE TODAY AT THE  ENDOSCOPY CENTER:   Refer to the procedure report that was given to you for any specific questions about what was found during the examination.  If the procedure report does not answer your questions, please call your gastroenterologist to clarify.  If you requested that your care partner not be given the details of your procedure findings, then the procedure report has been included in a sealed envelope for you to review at your convenience later.  YOU SHOULD EXPECT: Some feelings of bloating in the abdomen. Passage of more gas than usual.  Walking can help get rid of the air that was put into your GI tract during the procedure and reduce the bloating. If you had a lower endoscopy (such as a colonoscopy or flexible sigmoidoscopy) you may notice spotting of blood in your stool or on the toilet paper. If you underwent a bowel prep for your procedure, you may not have a normal bowel movement for a few days.  Please Note:  You might notice some irritation and congestion in your nose or some drainage.  This is from the oxygen used during your procedure.  There is no need for concern and it should clear up in a day or so.  SYMPTOMS TO REPORT IMMEDIATELY:  Following lower endoscopy (colonoscopy or flexible sigmoidoscopy):  Excessive amounts of blood in the stool  Significant tenderness or worsening of abdominal pains  Swelling of the abdomen that is new, acute  Fever of 100F or higher   For urgent or emergent issues, a gastroenterologist can be reached at any hour by calling [phone]. Do not use MyChart messaging for urgent concerns.    DIET:  We do recommend a small meal at first, but then you may proceed to your regular diet.  Drink plenty of fluids but you should avoid alcoholic beverages for 24 hours.  ACTIVITY:  You should plan to take it easy for the rest of today and you should NOT DRIVE or use  heavy machinery until tomorrow (because of the sedation medicines used during the test).    FOLLOW UP: Our staff will call the number listed on your records 48-72 hours following your procedure to check on you and address any questions or concerns that you may have regarding the information given to you following your procedure. If we do not reach you, we will leave a message.  We will attempt to reach you two times.  During this call, we will ask if you have developed any symptoms of COVID 19. If you develop any symptoms (ie: fever, flu-like symptoms, shortness of breath, cough etc.) before then, please call [phone].  If you test positive for Covid 19 in the 2 weeks post procedure, please call and report this information to us.    If any biopsies were taken you will be contacted by phone or by letter within the next 1-3 weeks.  Please call us at [phone] if you have not heard about the biopsies in 3 weeks.    SIGNATURES/CONFIDENTIALITY: You and/or your care partner have signed paperwork which will be entered into your electronic medical record.  These signatures attest to the fact that that the information above on your After Visit Summary has been reviewed and is understood.  Full responsibility of the confidentiality of this discharge information lies with you and/or your care-partner.  

## 2020-10-08 NOTE — Op Note (Signed)
[facility] Patient Name: [name] [name] Procedure Date: 10/08/2020 8:23 AM MRN: [id number] Endoscopist: Sherilyn Cooter L. Myrtie Neither , MD Age: 70 Referring MD:  Date of Birth: May 20, 1951 Gender: Male Account #: [id number] Procedure:                Colonoscopy Indications:              Surveillance: Personal history of adenomatous                            polyps on last colonoscopy > 5 years ago (>62mm                            right colon polyp of unknown pathology 11/2007; no                            polyps 10/2012) Medicines:                Monitored Anesthesia Care Procedure:                Pre-Anesthesia Assessment:                           - Prior to the procedure, a History and Physical                            was performed, and patient medications and                            allergies were reviewed. The patient's tolerance of                            previous anesthesia was also reviewed. The risks                            and benefits of the procedure and the sedation                            options and risks were discussed with the patient.                            All questions were answered, and informed consent                            was obtained. Prior Anticoagulants: The patient has                            taken no previous anticoagulant or antiplatelet                            agents. ASA Grade Assessment: III - A patient with                            severe systemic disease. After reviewing the risks  and benefits, the patient was deemed in                            satisfactory condition to undergo the procedure.                           After obtaining informed consent, the colonoscope                            was passed under direct vision. Throughout the                            procedure, the patient's blood pressure, pulse, and                            oxygen saturations were monitored continuously. The                             Olympus CF-HQ190 [id number]) Colonoscope was                            introduced through the anus and advanced to the the                            cecum, identified by appendiceal orifice and                            ileocecal valve. The colonoscopy was performed                            without difficulty. The patient tolerated the                            procedure well. The quality of the bowel                            preparation was good. The ileocecal valve,                            appendiceal orifice, and rectum were photographed. Scope In: 8:37:51 AM Scope Out: 8:53:27 AM Scope Withdrawal Time: 0 hours 11 minutes 35 seconds  Total Procedure Duration: 0 hours 15 minutes 36 seconds  Findings:                 The perianal and digital rectal examinations were                            normal.                           The entire examined colon appeared normal on direct                            and retroflexion views. Complications:            No immediate complications.  Estimated Blood Loss:     Estimated blood loss: none. Impression:               - The entire examined colon is normal on direct and                            retroflexion views.                           - No specimens collected. Recommendation:           - Patient has a contact number available for                            emergencies. The signs and symptoms of potential                            delayed complications were discussed with the                            patient. Return to normal activities tomorrow.                            Written discharge instructions were provided to the                            patient.                           - Resume previous diet.                           - Continue present medications.                           - No repeat routine colonoscopy due to current age                            (48 years or older) and the  absence of colonic                            polyps. Chenelle Benning L. Myrtie Neither, MD 10/08/2020 8:57:32 AM This report has been signed electronically.

## 2020-10-08 NOTE — Progress Notes (Signed)
Vitals by CW.  Pt's states no medical or surgical changes since previsit or office visit.  

## 2020-10-10 ENCOUNTER — Telehealth: Payer: Self-pay

## 2020-10-10 NOTE — Telephone Encounter (Signed)
  Follow up Call-  Call back number 10/08/2020  Post procedure Call Back phone  # [phone]  Permission to leave phone message Yes  Some recent data might be hidden     Patient questions:  Do you have a fever, pain , or abdominal swelling? No. Pain Score  0 *  Have you tolerated food without any problems? Yes.    Have you been able to return to your normal activities? Yes.    Do you have any questions about your discharge instructions: Diet   No. Medications  No. Follow up visit  No.  Do you have questions or concerns about your Care? No.  Actions: * If pain score is 4 or above: No action needed, pain <4.  1. Have you developed a fever since your procedure? no  2.   Have you had an respiratory symptoms (SOB or cough) since your procedure? no  3.   Have you tested positive for COVID 19 since your procedure no  4.   Have you had any family members/close contacts diagnosed with the COVID 19 since your procedure?  no   If yes to any of these questions please route to Laverna Peace, RN and Karlton Lemon, RN

## 2020-10-14 ENCOUNTER — Other Ambulatory Visit: Payer: Self-pay | Admitting: Endocrinology

## 2020-10-18 ENCOUNTER — Telehealth: Payer: Self-pay | Admitting: *Deleted

## 2020-10-18 MED ORDER — ICOSAPENT ETHYL 1 G PO CAPS: 2.0000 g | ORAL_CAPSULE | Freq: Two times a day (BID) | ORAL | 6 refills | Status: DC

## 2020-10-18 NOTE — Telephone Encounter (Signed)
Received notification from insurance co Lovaza is no longer on their formulary.  Per Dr Anne Fu try Vascepa.

## 2020-10-22 ENCOUNTER — Telehealth: Payer: Self-pay

## 2020-10-22 NOTE — Telephone Encounter (Signed)
**Note De-Identified Kavontae Pritchard Obfuscation** Icosapent PA started through covermymeds. Key: [id number]

## 2020-10-24 NOTE — Telephone Encounter (Signed)
**Note De-Identified Ronel Rodeheaver Obfuscation** Letter received from [facility] stating that they approved the pts Icosapent PA until 08/02/2021.  I have notified [facility] pharmacy of this approval.

## 2020-11-11 ENCOUNTER — Telehealth: Payer: Self-pay | Admitting: Endocrinology

## 2020-11-11 NOTE — Telephone Encounter (Signed)
Patient requests to be called at ph# [phone] re: Patient was not able to afford the [facility] G6 Transmitter and Receiver, therefore Patient wants to know if Dr. Lucianne Muss still wants Patient to be seen on 11/13/20 or if Patient should reschedule appointment for a later date once Patient has been able to secure the Dexcom G6. Patient states the Dexcom G6 may not be covered by his insurance.

## 2020-11-11 NOTE — Telephone Encounter (Signed)
Patient called again re: Patient spoke with his PHARM who requested the following new RX's be sent again as follows due to [facility] no longer has the following RX's in their system:  MEDICATION:   Dexcom G6 Transmitter AND  Dexcom G6 Receiver  PHARMACY:   [id number] Joylene John, [place] - [address] AT [facility] OF [address] Phone:  [phone]  Fax:  [phone]       HAS THE PATIENT CONTACTED THEIR PHARMACY?  Yes  IS THIS A 90 DAY SUPPLY : No  IS PATIENT OUT OF MEDICATION: Patient has never received the above  IF NOT; HOW MUCH IS LEFT: N/A  LAST APPOINTMENT DATE: @3 /14/2022  NEXT APPOINTMENT DATE:@4 /13/2022  DO WE HAVE YOUR PERMISSION TO LEAVE A DETAILED MESSAGE?: Yes  OTHER COMMENTS:    **Let patient know to contact pharmacy at the end of the day to make sure medication is ready. **  ** Please notify patient to allow 48-72 hours to process**  **Encourage patient to contact the pharmacy for refills or they can request refills through [facility]**

## 2020-11-13 ENCOUNTER — Encounter: Payer: Self-pay | Admitting: Endocrinology

## 2020-11-13 ENCOUNTER — Other Ambulatory Visit: Payer: Self-pay

## 2020-11-13 ENCOUNTER — Ambulatory Visit: Payer: Medicare HMO | Admitting: Endocrinology

## 2020-11-13 ENCOUNTER — Other Ambulatory Visit: Payer: Self-pay | Admitting: Endocrinology

## 2020-11-13 ENCOUNTER — Other Ambulatory Visit: Payer: Self-pay | Admitting: *Deleted

## 2020-11-13 VITALS — BP 138/72 | HR 96 | Ht 68.0 in | Wt 225.0 lb

## 2020-11-13 DIAGNOSIS — Z794 Long term (current) use of insulin: Secondary | ICD-10-CM | POA: Diagnosis not present

## 2020-11-13 DIAGNOSIS — E1165 Type 2 diabetes mellitus with hyperglycemia: Secondary | ICD-10-CM

## 2020-11-13 MED ORDER — DEXCOM G6 RECEIVER DEVI: 0 refills | Status: DC

## 2020-11-13 MED ORDER — DEXCOM G6 TRANSMITTER MISC: 3 refills | Status: DC

## 2020-11-13 MED ORDER — DAPAGLIFLOZIN PROPANEDIOL 10 MG PO TABS
10.0000 mg | ORAL_TABLET | Freq: Every day | ORAL | 1 refills | Status: DC
Start: 2020-11-13 — End: 2021-07-09

## 2020-11-13 MED ORDER — DEXCOM G6 SENSOR MISC: 3 refills | Status: DC

## 2020-11-13 NOTE — Patient Instructions (Addendum)
Humulin R 60-70 before supper to keep bedtime sugar at least under 180  R insulin at lunch also: 30 units  Farxiga 1/2 in am daily, watch BP and more fluids  Check blood sugars on waking up 4-5 days a week  Also check blood sugars about 2 hours after meals and do this after different meals by rotation  Recommended blood sugar levels on waking up are 90-130 and about 2 hours after meal is 130-160  Please bring your blood sugar monitor to each visit, thank you

## 2020-11-13 NOTE — Progress Notes (Signed)
Patient ID: [name] [name], male   DOB: 03/17/1951, 70 y.o.   MRN: [id number]           Reason for Appointment: Follow-up for Type 2 Diabetes    History of Present Illness:          Date of diagnosis of type 2 diabetes mellitus: 2011        Background history:  He was probably started on metformin at diagnosis and subsequently given Amaryl also Subsequently had been on Janumet and more recently this was changed to [facility]. Because of poor control he was given Levemir in addition to his Kombiglyze in 01/2013 The dose was progressively increased from initial dose of 12 units He did have somewhat better control right after starting insulin with A1c below 7% but only one time His blood sugars had been consistently high since 2014 with A1c at least 8%  Recent history:   INSULIN regimen is described as: [name] 144 units daily.   Humulin R U-500, 25 units at breakfast, 0-30 at lunch, and 45-50  at dinner  Non-insulin hypoglycemic drugs the patient is taking are: Metformin ER 2 g, Victoza 1.8 mg daily    A1c has been as low as 6.8, recently 8%   Current blood sugar patterns, management and problems identified:  He has not started [name] that was prescribed on his last visit  Also even though the prescription was sent for Dexcom he says he was not contacted by the company and he did not call himself  As before is checking blood sugars only in the mornings  He is now skipping the dose at lunchtime even though he is sometimes getting assignments  Previously would have higher readings after dinner but not clear what these are  His morning sugars are higher than before despite continuing high-dose [name]  He does not think he is missing his doses of Metformin or Victoza  Still not able to exercise and  Weight is continuing to go up  Otherwise he thinks he is still watching his diet and usually avoiding high-fat foods and fast food  Side effects from medications have  been: ?  Dizziness from Invokana  Hypoglycemia: None    Glucose monitoring:  done about once  in a day         Glucometer: One Touch Verio    Blood Glucose readings from download  FASTING range 174-290 with MEDIAN 249 with only 1 reading below 200  PREVIOUSLY:  PRE-MEAL Fasting Lunch Dinner Bedtime Overall  Glucose range:  130-231      Mean/median:  190    194   POST-MEAL PC Breakfast PC Lunch PC Dinner  Glucose range:    207, 291  Mean/median:         Self-care: The diet that the patient has been following is: tries to limit sweet drinks.      Meal times: Breakfast: 9 AM Lunch: 1 PM Dinner: 6 PM   Typical meal intake: Breakfast can occasionally be a fast food biscuit, lunch is a sandwich or vegetables, variable meal at suppertime.                Dietician visit, most recent: 6/16  Weight history: Highest  previously 256  Wt Readings from Last 3 Encounters:  11/13/20 225 lb (102.1 kg)  10/08/20 215 lb (97.5 kg)  09/24/20 221 lb 12.8 oz (100.6 kg)    Glycemic control:   Lab Results  Component Value Date   HGBA1C 8.0 (  A) 09/24/2020   HGBA1C 7.2 (A) 06/17/2020   HGBA1C 8.5 (H) 12/27/2019   Lab Results  Component Value Date   MICROALBUR 1.8 09/24/2020   LDLCALC 85 06/17/2020   CREATININE 0.68 09/24/2020    Other active problems: See review of systems    Allergies as of 11/13/2020      Reactions   Rosuvastatin Other (See Comments)   REACTION: myalgias      Medication List       Accurate as of November 13, 2020  9:05 AM. If you have any questions, ask your nurse or doctor.        aspirin 81 MG tablet Take 81 mg by mouth daily.   atorvastatin 40 MG tablet Commonly known as: LIPITOR Take 1 tablet by mouth once a day.   dapagliflozin propanediol 10 MG Tabs tablet Commonly known as: Farxiga Take 1 tablet (10 mg total) by mouth daily.   Dexcom G6 Sensor Misc Use to monitor blood sugar, change after 10 days   fenofibrate micronized 200 MG  capsule Commonly known as: LOFIBRA TAKE 1 CAPSULE BY MOUTH EVERY DAY   gabapentin 100 MG capsule Commonly known as: NEURONTIN Take 100 mg by mouth at bedtime.   glucose blood test strip USE TO TEST BLOOD SUGAR TWICE DAILY DX CODE E11.65   OneTouch Verio test strip Generic drug: glucose blood USE AS DIRECTED TO TEST SUGAR ONCE DAILY   HumuLIN R U-500 KwikPen 500 UNIT/ML kwikpen Generic drug: insulin regular human CONCENTRATED INJECT 20 UNITS UNDER THE SKIN BEFORE BREAKFAST, 30 UNITS AT LUNCH, AND 35 UNITS AT DINNER. MAY GO UP OR DOWN BY 5 UNITS BASED ON MEAL SIZE   hydrochlorothiazide 25 MG tablet Commonly known as: HYDRODIURIL Take 1 tablet (25 mg total) by mouth daily.   icosapent Ethyl 1 g capsule Commonly known as: Vascepa Take 2 capsules (2 g total) by mouth 2 (two) times daily.   metFORMIN 500 MG 24 hr tablet Commonly known as: GLUCOPHAGE-XR TAKE 4 TABLETS BY MOUTH DAILY WITH SUPPER   Novofine Pen Needle 32G X 6 MM Misc Generic drug: Insulin Pen Needle USE TO INJECT INSULIN TWICE DAILY.   OneTouch Delica Plus Lancet30G Misc USE ONCE DAILY TO TEST BLOOD SUGAR   ramipril 5 MG capsule Commonly known as: ALTACE Take 1 capsule (5 mg total) by mouth 2 (two) times daily.   Evaristo Bury FlexTouch 200 UNIT/ML FlexTouch Pen Generic drug: insulin degludec INJECT 136 UNITS UNDER THE SKIN ONCE DAILY What changed: See the new instructions.   Victoza 18 MG/3ML Sopn Generic drug: liraglutide ADMINISTER 1.8 MG UNDER THE SKIN AT THE SAME TIME EVERY DAY       Allergies:  Allergies  Allergen Reactions  . Rosuvastatin Other (See Comments)    REACTION: myalgias    Past Medical History:  Diagnosis Date  . Allergy    1-2 x a year   . Aortic valve sclerosis    echo june 2009  . Arthritis    fingers, back   . CAD (coronary artery disease)    a. 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 b. s/p CABG 04/2011.  . Carotid artery disease  (HCC)    Doppler, preop, September, 2012,  mild bilateral plaque with no significant stenoses  . Diabetes mellitus   . Dizziness    June, 2013  . Ejection fraction    a. Ejection fraction 60%, echo, June, 2009. b. EF preserved by cath 2012.  [name]  Food poisoning    age 87  . GERD (gastroesophageal reflux disease)    past hx   . Hx of CABG    [name],  x4  May 01, 2011 limited to the LAD, left radial artery to the OM, SVG to diagonal, SVG to posterior descending  . Hyperlipidemia    on meds   . Hypertension    controlled   . Muscle ache    on Crestor  09/04/09 CPK 193(7-232) tolerated simvastatin in the past  . 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 PVC (premature ventricular contraction) 06/2009   November, 2010  . Rash     Past Surgical History:  Procedure Laterality Date  . BACK SURGERY    . coccyx bone removed    . COLONOSCOPY    . CORONARY ARTERY BYPASS GRAFT  05/01/2011   CABG x4 with LIMA to LAD, LRA to OM, SVG to D1, SVG to PDA, EVH via left thigh  . Intraoperative transesophageal echocardiography.  05/01/2011  . pilonidal cystectomy    . POLYPECTOMY    . TIBIA FRACTURE SURGERY      Family History  Problem Relation Age of Onset  . Heart attack Paternal Grandfather        late 43s  . Hearing loss Paternal Grandfather   . Hypertension Father   . Heart attack Father 65       bypass  . Diabetes Father   . Hearing loss Father   . Thyroid disease Father   . Cancer Maternal Grandfather        site unknown  . Prostate cancer Maternal Grandfather   . Diabetes Paternal Aunt        X 3  . Heart failure Maternal Grandmother   . Diabetes Mother   . Diabetes Paternal Uncle   . Stroke Neg Hx   . Colon cancer Neg Hx   . Colon polyps Neg Hx   . Esophageal cancer Neg Hx   . Rectal cancer Neg Hx   . Stomach cancer Neg Hx     Social History:  reports that he has never smoked. He has never used smokeless tobacco. He reports that he does not drink  alcohol and does not use drugs.    Review of Systems   Lipid history: He has been taking fish oil and fenofibrate for significantly high triglycerides along with his Lipitor 40 mg  Triglycerides have been mostly high; his insurance will not cover Vascepa or Lovaza Currently using OTC omega-3 fish oil 1/day    Lab Results  Component Value Date   CHOL 155 06/17/2020   HDL 36.40 (L) 06/17/2020   LDLCALC 85 06/17/2020   LDLDIRECT 63.0 12/27/2019   TRIG 170.0 (H) 06/17/2020   CHOLHDL 4 06/17/2020            THYROID: He has a 2.4 cm dominant left-sided thyroid nodule on Ultrasound in 12/2014  Follow-up showed the same size of the nodules  TSH was 1.6 done by PCP in 06/2019   Lab Results  Component Value Date   TSH 2.11 04/28/2017     HYPERTENSION:  on  10 mg ramipril using 5 mg twice daily and also HCTZ 25 mg from his cardiologist  BP Readings from Last 3 Encounters:  11/13/20 138/72  10/08/20 139/69  09/24/20 128/86     LABS:  No visits with results within 1 Week(s) from this visit.  Latest known visit with results is:  Office Visit on 09/24/2020  Component Date Value Ref Range Status  . Hemoglobin A1C 09/24/2020 8.0* 4.0 - 5.6 % Final    Physical Examination:  BP 138/72   Pulse 96   Ht 5\' 8"  (1.727 m)   Wt 225 lb (102.1 kg)   SpO2 96%   BMI 34.21 kg/m     ASSESSMENT:  Diabetes type 2 on insulin     See history of present illness for detailed discussion of  current management, blood sugar patterns and problems identified  His A1c is most recently at 8%  He is on high doses of basal bolus insulin and Victoza with Metformin  Difficult to assess his control again because of lack of postprandial monitoring He has not likely taken enough mealtime insulin especially lunch and dinner Likely has high readings in the mornings from postprandial hyperglycemia the night before which are not checked Did not start [name] as prescribed previously and not clear  if this was filled by the pharmacy  HYPERTENSION: Blood pressure is controlled   PLAN:   He was advised to start [name] new prescription sent  Again discussed benefits of Marcelline DeistFarxiga and given him patient information handout  [facility] and he will let us know if he has any difficulty getting this, to call the company if she is not receiving the supplies  He will likely need instructions on starting this   Tresiba 144 units daily.   Humulin R U-500, 25 units at breakfast, 30 at lunchtime and 60-70 a.m. dinner time based on his meal size Discussed targeting postprandial readings at night under 180  With starting 5 mg Farxiga he will reduce ramipirl to 1 capsule instead of twice a day  Patient Instructions  Humulin R 60-70 before supper to keep bedtime sugar at least under 180  Farxiga 1/2 in am daily, watch BP and more fluids  Check blood sugars on waking up 4-5 days a week  Also check blood sugars about 2 hours after meals and do this after different meals by rotation  Recommended blood sugar levels on waking up are 90-130 and about 2 hours after meal is 130-160  Please bring your blood sugar monitor to each visit, thank you         Reather LittlerAjay Ivorie Uplinger 11/13/2020, 9:05 AM   Note: This office note was prepared with Dragon voice recognition system technology. Any transcriptional errors that result from this process are unintentional.

## 2020-11-13 NOTE — Telephone Encounter (Signed)
Sent Rx to pharmacy  

## 2021-01-03 ENCOUNTER — Ambulatory Visit: Payer: Medicare HMO | Admitting: Endocrinology

## 2021-01-03 ENCOUNTER — Encounter: Payer: Self-pay | Admitting: Endocrinology

## 2021-01-03 ENCOUNTER — Other Ambulatory Visit: Payer: Self-pay

## 2021-01-03 VITALS — BP 126/78 | HR 79 | Ht 68.0 in | Wt 222.0 lb

## 2021-01-03 DIAGNOSIS — E1165 Type 2 diabetes mellitus with hyperglycemia: Secondary | ICD-10-CM | POA: Diagnosis not present

## 2021-01-03 DIAGNOSIS — I10 Essential (primary) hypertension: Secondary | ICD-10-CM | POA: Diagnosis not present

## 2021-01-03 DIAGNOSIS — Z794 Long term (current) use of insulin: Secondary | ICD-10-CM | POA: Diagnosis not present

## 2021-01-03 LAB — POCT GLYCOSYLATED HEMOGLOBIN (HGB A1C): Hemoglobin A1C: 7.5 % — AB (ref 4.0–5.6)

## 2021-01-03 LAB — GLUCOSE, POCT (MANUAL RESULT ENTRY): POC Glucose: 203 mg/dl — AB (ref 70–99)

## 2021-01-03 NOTE — Progress Notes (Signed)
Patient ID: [name] [name], male   DOB: [birth year], 71 y.o.   MRN: [id number]           Reason for Appointment: Follow-up for Type 2 Diabetes    History of Present Illness:          Date of diagnosis of type 2 diabetes mellitus: 2011        Background history:  He was probably started on metformin at diagnosis and subsequently given Amaryl also Subsequently had been on Janumet and more recently this was changed to [facility]. Because of poor control he was given Levemir in addition to his Kombiglyze in 01/2013 The dose was progressively increased from initial dose of 12 units He did have somewhat better control right after starting insulin with A1c below 7% but only one time His blood sugars had been consistently high since 2014 with A1c at least 8%  Recent history:   INSULIN regimen is described as: [name] 144 units daily.   Humulin R U-500, 35 units at breakfast, 35 at lunch, and 50-60  at dinner  Non-insulin hypoglycemic drugs the patient is taking are: Metformin ER 2 g, Victoza 1.8 mg daily    A1c has been as low as 6.8, now 7.5 compared to 8%   Current blood sugar patterns, management and problems identified:  He has started [name] that was prescribed on his last visit  Since his insurance did not cover Dexcom he has the freestyle [place] but he did not start it since he needed instructions  Also has not checked his blood sugar since about 3 weeks ago as he ran out of test strips for his One Touch meter  Also he was supposed to increase his Humulin R at dinnertime to at least 60 units but he is mostly taking 50 to 60 units  As before unable to review what his blood sugars are after meals especially supper since he only monitors in the morning  With only a slight increase in the insulin doses on the Humulin his blood sugars are only marginally better  Fasting reading today was over 200  No hypoglycemia overnight  Still not able to exercise because of back  pain  He says he is recently trying to watch his diet better, especially with his dental issues he is eating softer foods and using Glucerna  Weight has come down 3 pounds  Side effects from medications have been: ?  Dizziness from Invokana  Hypoglycemia: None    Glucose monitoring:  done about once  in a day         Glucometer: One Touch Verio    Blood Glucose readings from review  FASTING recent readings 183-294 with most readings between 180-200 and 90-day average 223  PREVIOUS fasting range 174-290 with MEDIAN 249 with only 1 reading below 200      Self-care: The diet that the patient has been following is: tries to limit sweet drinks.      Meal times: Breakfast: 9 AM Lunch: 1 PM Dinner: 6 PM   Typical meal intake: Breakfast can occasionally be a fast food biscuit, lunch is a sandwich or vegetables, variable meal at suppertime.                Dietician visit, most recent: 6/16  Weight history: Highest  previously 256  Wt Readings from Last 3 Encounters:  01/03/21 222 lb (100.7 kg)  11/13/20 225 lb (102.1 kg)  10/08/20 215 lb (97.5 kg)    Glycemic control:  Lab Results  Component Value Date   HGBA1C 8.0 (A) 09/24/2020   HGBA1C 7.2 (A) 06/17/2020   HGBA1C 8.5 (H) 12/27/2019   Lab Results  Component Value Date   MICROALBUR 1.8 09/24/2020   LDLCALC 85 06/17/2020   CREATININE 0.68 09/24/2020    Other active problems: See review of systems    Allergies as of 01/03/2021      Reactions   Rosuvastatin Other (See Comments)   REACTION: myalgias      Medication List       Accurate as of January 03, 2021  9:10 AM. If you have any questions, ask your nurse or doctor.        amoxicillin 500 MG tablet Commonly known as: AMOXIL Take 1,000 mg by mouth 2 (two) times daily.   aspirin 81 MG tablet Take 81 mg by mouth daily.   atorvastatin 40 MG tablet Commonly known as: LIPITOR Take 1 tablet by mouth once a day.   dapagliflozin propanediol 10 MG Tabs  tablet Commonly known as: Farxiga Take 1 tablet (10 mg total) by mouth daily.   Dexcom G6 Receiver Devi Use as direction to check sugar daily.   Dexcom G6 Sensor Misc Use to monitor blood sugar, change after 10 days   Dexcom G6 Transmitter Misc Change 1 Device every 90 days.   fenofibrate micronized 200 MG capsule Commonly known as: LOFIBRA TAKE 1 CAPSULE BY MOUTH EVERY DAY   gabapentin 100 MG capsule Commonly known as: NEURONTIN Take 100 mg by mouth at bedtime.   glucose blood test strip USE TO TEST BLOOD SUGAR TWICE DAILY DX CODE E11.65   OneTouch Verio test strip Generic drug: glucose blood USE AS DIRECTED TO TEST SUGAR ONCE DAILY   HumuLIN R U-500 KwikPen 500 UNIT/ML kwikpen Generic drug: insulin regular human CONCENTRATED INJECT 20 UNITS UNDER THE SKIN BEFORE BREAKFAST, 30 UNITS AT LUNCH, AND 35 UNITS AT DINNER. MAY GO UP OR DOWN BY 5 UNITS BASED ON MEAL SIZE   hydrochlorothiazide 25 MG tablet Commonly known as: HYDRODIURIL Take 1 tablet (25 mg total) by mouth daily.   icosapent Ethyl 1 g capsule Commonly known as: Vascepa Take 2 capsules (2 g total) by mouth 2 (two) times daily.   metFORMIN 500 MG 24 hr tablet Commonly known as: GLUCOPHAGE-XR TAKE 4 TABLETS BY MOUTH DAILY WITH SUPPER   Novofine Pen Needle 32G X 6 MM Misc Generic drug: Insulin Pen Needle USE TO INJECT INSULIN TWICE DAILY.   OneTouch Delica Plus Lancet30G Misc USE ONCE DAILY TO TEST BLOOD SUGAR   ramipril 5 MG capsule Commonly known as: ALTACE TAKE 1 CAPSULE(5 MG) BY MOUTH TWICE DAILY   Tresiba FlexTouch 200 UNIT/ML FlexTouch Pen Generic drug: insulin degludec INJECT 136 UNITS UNDER THE SKIN ONCE DAILY What changed: See the new instructions.   Victoza 18 MG/3ML Sopn Generic drug: liraglutide ADMINISTER 1.8 MG UNDER THE SKIN AT THE SAME TIME EVERY DAY       Allergies:  Allergies  Allergen Reactions  . Rosuvastatin Other (See Comments)    REACTION: myalgias    Past Medical  History:  Diagnosis Date  . Allergy    1-2 x a year   . Aortic valve sclerosis    echo june 2009  . Arthritis    fingers, back   . CAD (coronary artery disease)    a. 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 b. s/p CABG 04/2011.  . Carotid artery disease (HCC)  Doppler, preop, September, 2012,  mild bilateral plaque with no significant stenoses  . Diabetes mellitus   . Dizziness    June, 2013  . Ejection fraction    a. Ejection fraction 60%, echo, June, 2009. b. EF preserved by cath 2012.  [name] Food poisoning    age 49  . GERD (gastroesophageal reflux disease)    past hx   . Hx of CABG    [name],  x4  May 01, 2011 limited to the LAD, left radial artery to the OM, SVG to diagonal, SVG to posterior descending  . Hyperlipidemia    on meds   . Hypertension    controlled   . Muscle ache    on Crestor  09/04/09 CPK 193(7-232) tolerated simvastatin in the past  . 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 PVC (premature ventricular contraction) 06/2009   November, 2010  . Rash     Past Surgical History:  Procedure Laterality Date  . BACK SURGERY    . coccyx bone removed    . COLONOSCOPY    . CORONARY ARTERY BYPASS GRAFT  05/01/2011   CABG x4 with LIMA to LAD, LRA to OM, SVG to D1, SVG to PDA, EVH via left thigh  . Intraoperative transesophageal echocardiography.  05/01/2011  . pilonidal cystectomy    . POLYPECTOMY    . TIBIA FRACTURE SURGERY      Family History  Problem Relation Age of Onset  . Heart attack Paternal Grandfather        late 62s  . Hearing loss Paternal Grandfather   . Hypertension Father   . Heart attack Father 65       bypass  . Diabetes Father   . Hearing loss Father   . Thyroid disease Father   . Cancer Maternal Grandfather        site unknown  . Prostate cancer Maternal Grandfather   . Diabetes Paternal Aunt        X 3  . Heart failure Maternal Grandmother   . Diabetes Mother   .  Diabetes Paternal Uncle   . Stroke Neg Hx   . Colon cancer Neg Hx   . Colon polyps Neg Hx   . Esophageal cancer Neg Hx   . Rectal cancer Neg Hx   . Stomach cancer Neg Hx     Social History:  reports that he has never smoked. He has never used smokeless tobacco. He reports that he does not drink alcohol and does not use drugs.    Review of Systems   Lipid history: He has been taking fish oil and fenofibrate for significantly high triglycerides along with his Lipitor 40 mg  Triglycerides have been mostly high; his insurance will not cover Vascepa or Lovaza Currently using OTC omega-3 fish oil 1/day    Lab Results  Component Value Date   CHOL 155 06/17/2020   HDL 36.40 (L) 06/17/2020   LDLCALC 85 06/17/2020   LDLDIRECT 63.0 12/27/2019   TRIG 170.0 (H) 06/17/2020   CHOLHDL 4 06/17/2020            THYROID: He has a 2.4 cm dominant left-sided thyroid nodule on Ultrasound in 12/2014  Follow-up showed the same size of the nodules  TSH was 1.6 done by PCP in 06/2019   Lab Results  Component Value Date   TSH 2.11 04/28/2017     HYPERTENSION:  on   ramipril 5 mg  daily and also HCTZ  25 mg from his cardiologist  BP Readings from Last 3 Encounters:  01/03/21 126/78  11/13/20 138/72  10/08/20 139/69     LABS:  No visits with results within 1 Week(s) from this visit.  Latest known visit with results is:  Office Visit on 09/24/2020  Component Date Value Ref Range Status  . Hemoglobin A1C 09/24/2020 8.0* 4.0 - 5.6 % Final    Physical Examination:  BP 126/78 (BP Location: Left Arm, Patient Position: Sitting, Cuff Size: Large)   Pulse 79   Ht 5\' 8"  (1.727 m)   Wt 222 lb (100.7 kg)   SpO2 95%   BMI 33.75 kg/m     ASSESSMENT:  Diabetes type 2 on insulin     See history of present illness for detailed discussion of  current management, blood sugar patterns and problems identified  His A1c is only slightly better at 7.5  He is on high doses of insulin with basal  bolus regimen, Farxiga, Victoza metformin  Recently has had minimal glucose monitoring and is unable to start the freestyle libre on his own that he has been able to get Looks like he still needs higher insulin doses with his fasting readings being consistently high including today  HYPERTENSION: Well-controlled without orthostatic symptoms, also on Farxiga now with reduced dose of ramipril  PLAN:   He was scheduled to see the diabetes educator to start the freestyle libre  Check chemistry panel with starting 08-07-2002  We will go up to 20 units on his evening Humulin R dosage but this will have to be adjusted based on his blood sugar patterns  He will try to check his blood sugar 4 times a day and emphasized the need to monitor more regularly when he uses the freestyle libre  Consider stopping Victoza since he does not appear to be getting much benefit  No change in ramipril  Consider insulin pump if he still has difficulty getting consistent control     There are no Patient Instructions on file for this visit.      Marcelline Deist 01/03/2021, 9:10 AM   Note: This office note was prepared with Dragon voice recognition system technology. Any transcriptional errors that result from this process are unintentional.

## 2021-01-03 NOTE — Patient Instructions (Addendum)
Humulin 70-80 beforesupper   Call if getting low

## 2021-01-06 ENCOUNTER — Ambulatory Visit: Payer: Medicare HMO | Admitting: Nutrition

## 2021-01-06 LAB — BASIC METABOLIC PANEL
BUN: 23 mg/dL (ref 6–23)
CO2: 25 mEq/L (ref 19–32)
Calcium: 9.3 mg/dL (ref 8.4–10.5)
Chloride: 102 mEq/L (ref 96–112)
Creatinine, Ser: 0.93 mg/dL (ref 0.40–1.50)
GFR: 84.36 mL/min (ref >60.00)
Glucose, Bld: 185 mg/dL — ABNORMAL HIGH (ref 70–99)
Potassium: 4.8 mEq/L (ref 3.5–5.1)
Sodium: 137 mEq/L (ref 135–145)

## 2021-01-20 ENCOUNTER — Other Ambulatory Visit: Payer: Self-pay | Admitting: Endocrinology

## 2021-01-29 ENCOUNTER — Telehealth: Payer: Self-pay

## 2021-01-29 NOTE — Telephone Encounter (Signed)
    [name] [name] is calling to f/u pre-op clearance, she added Anesthesia type - General

## 2021-01-29 NOTE — Telephone Encounter (Signed)
   Name: [name] [name]  DOB: 04-27-1951  MRN: [id number]   Primary Cardiologist: Donato Schultz, MD  Chart reviewed as part of pre-operative protocol coverage. Patient was contacted 01/29/2021 in reference to pre-operative risk assessment for pending surgery as outlined below.  DEAUNTE DENTE was last seen on 06/21/20 by Dr. Anne Fu.  Since that day, ADELARD SANON has done well.  He can complete more than 4.0 METS without angina. He understands he is at higher risk for cardiac complications and agrees to proceed.  Therefore, based on ACC/AHA guidelines, the patient would be at acceptable risk for the planned procedure without further cardiovascular testing.   The patient was advised that if he develops new symptoms prior to surgery to contact our office to arrange for a follow-up visit, and he verbalized understanding.  I will route this recommendation to the requesting party via Epic fax function and remove from pre-op pool. Please call with questions.  Roe Rutherford Bianco Cange, PA 01/29/2021, 4:40 PM

## 2021-01-29 NOTE — Telephone Encounter (Signed)
Received a clearance from [facility] requesting clearance for back surgery.   I left a message with their office requesting more information about the type of procedure that will be completed. Awaiting a call back.

## 2021-01-29 NOTE — Telephone Encounter (Signed)
   [facility] Pre-operative Risk Assessment    Patient Name: [name] [name]  DOB: 30-Aug-1950  MRN: [id number]   HEARTCARE STAFF: - Please ensure there is not already an duplicate clearance open for this procedure. - Under Visit Info/Reason for Call, type in Other and utilize the format Clearance MM/DD/YY or Clearance TBD. Do not use dashes or single digits. - If request is for dental extraction, please clarify the # of teeth to be extracted. - If the patient is currently at the dentist's office, call Pre-Op APP to address. If the patient is not currently in the dentist office, please route to the Pre-Op pool  Request for surgical clearance:  What type of surgery is being performed? L3-L4, L4-L5 minimally Laminectomy/ Foraminotomy  When is this surgery scheduled? 02/11/2021   What type of clearance is required (medical clearance vs. Pharmacy clearance to hold med vs. Both)? Medical   Are there any medications that need to be held prior to surgery and how long? none listed   Practice name and name of physician performing surgery? [facility] Dr Elmyra Ricks Than   What is the office phone number? [phone]   7.   What is the office fax number? [phone]  8.   Anesthesia type (None, local, MAC, general) ? None specified    [name] 01/29/2021, 12:33 PM  _________________________________________________________________   (provider comments below)

## 2021-02-11 ENCOUNTER — Other Ambulatory Visit: Payer: Self-pay | Admitting: Endocrinology

## 2021-02-18 ENCOUNTER — Telehealth: Payer: Self-pay | Admitting: Endocrinology

## 2021-02-18 ENCOUNTER — Ambulatory Visit: Payer: Medicare HMO | Admitting: Cardiology

## 2021-02-18 ENCOUNTER — Other Ambulatory Visit: Payer: Self-pay

## 2021-02-18 MED ORDER — METFORMIN HCL ER 500 MG PO TB24: ORAL_TABLET | ORAL | 3 refills | Status: DC

## 2021-02-18 NOTE — Telephone Encounter (Signed)
MEDICATION: Metformin  PHARMACY:  [facility] [address] [place], [place]  HAS THE PATIENT CONTACTED THEIR PHARMACY?  Yes and was advised to call us directly  IS THIS A 90 DAY SUPPLY : YES - if possible  IS PATIENT OUT OF MEDICATION:  yes (out for the last 2 days)  IF NOT; HOW MUCH IS LEFT: none  LAST APPOINTMENT DATE: @7 /07/2021  NEXT APPOINTMENT DATE:@8 /12/2020  DO WE HAVE YOUR PERMISSION TO LEAVE A DETAILED MESSAGE?: yes

## 2021-02-18 NOTE — Telephone Encounter (Signed)
Sent refill to the pharmacy , per pt request.

## 2021-03-07 ENCOUNTER — Ambulatory Visit: Payer: Medicare HMO | Admitting: Endocrinology

## 2021-03-13 ENCOUNTER — Other Ambulatory Visit: Payer: Self-pay | Admitting: Endocrinology

## 2021-04-08 ENCOUNTER — Other Ambulatory Visit: Payer: Self-pay | Admitting: Endocrinology

## 2021-04-08 ENCOUNTER — Encounter: Payer: Self-pay | Admitting: Endocrinology

## 2021-04-08 ENCOUNTER — Other Ambulatory Visit: Payer: Self-pay

## 2021-04-08 ENCOUNTER — Ambulatory Visit (INDEPENDENT_AMBULATORY_CARE_PROVIDER_SITE_OTHER): Payer: Medicare HMO | Admitting: Endocrinology

## 2021-04-08 VITALS — BP 128/78 | HR 75 | Ht 68.0 in | Wt 222.6 lb

## 2021-04-08 DIAGNOSIS — E1165 Type 2 diabetes mellitus with hyperglycemia: Secondary | ICD-10-CM

## 2021-04-08 DIAGNOSIS — I10 Essential (primary) hypertension: Secondary | ICD-10-CM

## 2021-04-08 DIAGNOSIS — Z794 Long term (current) use of insulin: Secondary | ICD-10-CM | POA: Diagnosis not present

## 2021-04-08 DIAGNOSIS — E782 Mixed hyperlipidemia: Secondary | ICD-10-CM | POA: Diagnosis not present

## 2021-04-08 LAB — COMPREHENSIVE METABOLIC PANEL
ALT: 27 U/L (ref 0–53)
AST: 18 U/L (ref 0–37)
Albumin: 4.5 g/dL (ref 3.5–5.2)
Alkaline Phosphatase: 31 U/L — ABNORMAL LOW (ref 39–117)
BUN: 14 mg/dL (ref 6–23)
CO2: 27 mEq/L (ref 19–32)
Calcium: 9.5 mg/dL (ref 8.4–10.5)
Chloride: 102 mEq/L (ref 96–112)
Creatinine, Ser: 0.74 mg/dL (ref 0.40–1.50)
GFR: 92.93 mL/min (ref >60.00)
Glucose, Bld: 174 mg/dL — ABNORMAL HIGH (ref 70–99)
Potassium: 3.9 mEq/L (ref 3.5–5.1)
Sodium: 139 mEq/L (ref 135–145)
Total Bilirubin: 0.6 mg/dL (ref 0.2–1.2)
Total Protein: 7.3 g/dL (ref 6.0–8.3)

## 2021-04-08 LAB — LDL CHOLESTEROL, DIRECT: Direct LDL: 55 mg/dL

## 2021-04-08 LAB — POCT GLYCOSYLATED HEMOGLOBIN (HGB A1C): Hemoglobin A1C: 7 % — AB (ref 4.0–5.6)

## 2021-04-08 LAB — LIPID PANEL
Cholesterol: 113 mg/dL (ref 0–200)
HDL: 30 mg/dL — ABNORMAL LOW (ref >39.00)
NonHDL: 82.63
Total CHOL/HDL Ratio: 4
Triglycerides: 210 mg/dL — ABNORMAL HIGH (ref 0.0–149.0)
VLDL: 42 mg/dL — ABNORMAL HIGH (ref 0.0–40.0)

## 2021-04-08 NOTE — Progress Notes (Signed)
Patient ID: [name] [name], male   DOB: 01-Aug-1951, 70 y.o.   MRN: [id number]           Reason for Appointment: Follow-up for Type 2 Diabetes    History of Present Illness:          Date of diagnosis of type 2 diabetes mellitus: 2011        Background history:  He was probably started on metformin at diagnosis and subsequently given Amaryl also Subsequently had been on Janumet and more recently this was changed to [facility]. Because of poor control he was given Levemir in addition to his Kombiglyze in 01/2013 The dose was progressively increased from initial dose of 12 units He did have somewhat better control right after starting insulin with A1c below 7% but only one time His blood sugars had been consistently high since 2014 with A1c at least 8%  Recent history:   INSULIN regimen is described as: [name] 144 units daily.   Humulin R U-500, 35 units at breakfast, 35 at lunch, and 50-60  at dinner  Non-insulin hypoglycemic drugs the patient is taking are: Metformin ER 2 g, Victoza 1.8 mg daily    A1c has been as low as 6.8, now 7   Current blood sugar patterns, management and problems identified: He has started using the freestyle libre sensor since after his visit in June However recent data is only available for the last week of August and the last 3 days  As discussed in the interpretation of the download below he has continued postprandial hyperglycemia  However he has on an average better blood sugars  Although he has not lost much weight he was probably eating smaller portions after his back surgery in 7/22  Also for a few days he did not take Victoza because of cost but is taking it again recently  In the last few days he has started being more active and playing golf  He was told to increase his evening insulin by 20 units on his last visit but he has not done so and is taking about the same doses with each meal  Generally blood sugars are progressively higher  between early morning and late evening  No hypoglycemia despite large doses of basal insulin  Side effects from medications have been: ?  Dizziness from [facility]  Interpretation of the download for the last 2 weeks is as follows  Summary of blood sugar patterns His blood sugars are modestly high overnight but show progressive rise in blood sugars early morning before down and on an average level of until 6 PM with further increase subsequently with highest readings around 8 PM and then declining after 9:30 PM HYPERGLYCEMIA is occurring inconsistently overnight as well as some sporadic blood sugar spikes after various meals inconsistently  POSTPRANDIAL readings are generally not increasing sharply except a couple of times at breakfast and dinnertime  Hypoglycemia documented only midday on 8/30 but data was then interrupted  Preprandial readings appear to be running relatively high at lunch and dinner  CGM use % of time 58  2-week average/GV 181  Time in range        %  % Time Above 180 39+10  % Time above 250   % Time Below 70 1     PRE-MEAL Fasting Lunch Dinner Bedtime Overall  Glucose range:       Averages: 154  186 203 187    POST-MEAL PC Breakfast PC Lunch PC Dinner  Glucose  range:     Averages: 196 200 226      FASTING recent readings 183-294 with most readings between 180-200 and 90-day average 223  PREVIOUS fasting range 174-290 with MEDIAN 249 with only 1 reading below 200     Self-care: The diet that the patient has been following is: tries to limit sweet drinks.      Meal times: Breakfast: 9 AM Lunch: 1 PM Dinner: 6 PM   Typical meal intake: Breakfast can occasionally be a fast food biscuit, lunch is a sandwich or vegetables, variable meal at suppertime.                Dietician visit, most recent: 6/16  Weight history: Highest  previously 256  Wt Readings from Last 3 Encounters:  04/08/21 222 lb 9.6 oz (101 kg)  01/03/21 222 lb (100.7 kg)  11/13/20 225 lb  (102.1 kg)    Glycemic control:   Lab Results  Component Value Date   HGBA1C 7.0 (A) 04/08/2021   HGBA1C 7.5 (A) 01/03/2021   HGBA1C 8.0 (A) 09/24/2020   Lab Results  Component Value Date   MICROALBUR 1.8 09/24/2020   LDLCALC 85 06/17/2020   CREATININE 0.93 01/03/2021    Other active problems: See review of systems    Allergies as of 04/08/2021       Reactions   Rosuvastatin Other (See Comments)   REACTION: myalgias        Medication List        Accurate as of April 08, 2021 11:29 AM. If you have any questions, ask your nurse or doctor.          STOP taking these medications    amoxicillin 500 MG tablet Commonly known as: AMOXIL Stopped by: Reather LittlerAjay Karne Ozga, MD   gabapentin 100 MG capsule Commonly known as: NEURONTIN Stopped by: Reather LittlerAjay Tolbert Matheson, MD       TAKE these medications    aspirin 81 MG tablet Take 81 mg by mouth daily.   atorvastatin 40 MG tablet Commonly known as: LIPITOR Take 1 tablet by mouth once a day.   dapagliflozin propanediol 10 MG Tabs tablet Commonly known as: Farxiga Take 1 tablet (10 mg total) by mouth daily.   Dexcom G6 Receiver Devi Use as direction to check sugar daily.   Dexcom G6 Sensor Misc Use to monitor blood sugar, change after 10 days   Dexcom G6 Transmitter Misc Change 1 Device every 90 days.   fenofibrate micronized 200 MG capsule Commonly known as: LOFIBRA TAKE 1 CAPSULE BY MOUTH EVERY DAY   FISH OIL PO Take by mouth.   glucose blood test strip USE TO TEST BLOOD SUGAR TWICE DAILY DX CODE E11.65   OneTouch Verio test strip Generic drug: glucose blood USE AS DIRECTED TO TEST SUGAR ONCE DAILY   HumuLIN R U-500 KwikPen 500 UNIT/ML KwikPen Generic drug: insulin regular human CONCENTRATED INJECT 20 UNITS UNDER THE SKIN BEFORE BREAKFAST, 30 UNITS AT LUNCH, 35 UNITS AT DINNER, MAY CHANGE BY 5 UNITS BASED ON MEALS What changed: additional instructions Changed by: Reather LittlerAjay Vermell [place], MD   hydrochlorothiazide 25 MG  tablet Commonly known as: HYDRODIURIL Take 1 tablet (25 mg total) by mouth daily.   icosapent Ethyl 1 g capsule Commonly known as: Vascepa Take 2 capsules (2 g total) by mouth 2 (two) times daily.   metFORMIN 500 MG 24 hr tablet Commonly known as: GLUCOPHAGE-XR TAKE 4 TABLETS BY MOUTH DAILY WITH SUPPER   Novofine Pen Needle 32G X 6  MM Misc Generic drug: Insulin Pen Needle USE TO INJECT INSULIN TWICE DAILY.   OneTouch Delica Plus Lancet30G Misc USE ONCE DAILY TO TEST BLOOD SUGAR   ramipril 5 MG capsule Commonly known as: ALTACE TAKE 1 CAPSULE(5 MG) BY MOUTH TWICE DAILY   Tresiba FlexTouch 200 UNIT/ML FlexTouch Pen Generic drug: insulin degludec Inject 144 Units into the skin daily with supper. What changed: Another medication with the same name was removed. Continue taking this medication, and follow the directions you see here. Changed by: Reather Littler, MD   Victoza 18 MG/3ML Sopn Generic drug: liraglutide ADMINISTER 1.8 MG UNDER THE SKIN AT THE SAME TIME EVERY DAY        Allergies:  Allergies  Allergen Reactions   Rosuvastatin Other (See Comments)    REACTION: myalgias    Past Medical History:  Diagnosis Date   Allergy    1-2 x a year    Aortic valve sclerosis    echo june 2009   Arthritis    fingers, back    CAD (coronary artery disease)    a. 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 b. s/p CABG 04/2011.   Carotid artery disease (HCC)    Doppler, preop, September, 2012,  mild bilateral plaque with no significant stenoses   Diabetes mellitus    Dizziness    June, 2013   Ejection fraction    a. Ejection fraction 60%, echo, June, 2009. b. EF preserved by cath 2012.   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  05/01/2011   pilonidal cystectomy     POLYPECTOMY     TIBIA FRACTURE SURGERY      Family History  Problem Relation Age of Onset   Heart attack Paternal Grandfather        late 58s   Hearing loss Paternal Grandfather    Hypertension Father    Heart attack Father 80       bypass   Diabetes Father    Hearing loss Father    Thyroid disease Father    Cancer Maternal Grandfather        site unknown   Prostate cancer Maternal Grandfather    Diabetes Paternal Aunt        X 3   Heart failure Maternal Grandmother    Diabetes Mother    Diabetes Paternal Uncle    Stroke Neg Hx    Colon cancer Neg Hx    Colon polyps Neg Hx  Esophageal cancer Neg Hx    Rectal cancer Neg Hx    Stomach cancer Neg Hx     Social History:  reports that he has never smoked. He has never used smokeless tobacco. He reports that he does not drink alcohol and does not use drugs.    Review of Systems   Lipid history: He has been taking fish oil and fenofibrate for baseline high triglycerides along with generic Lipitor 40 mg  Triglycerides have been variably high; his insurance will not cover Vascepa or Lovaza Currently using OTC omega-3 fish oil 1/day    Lab Results  Component Value Date   CHOL 155 06/17/2020   HDL 36.40 (L) 06/17/2020   LDLCALC 85 06/17/2020   LDLDIRECT 63.0 12/27/2019   TRIG 170.0 (H) 06/17/2020   CHOLHDL 4 06/17/2020            THYROID: He has  a 2.4 cm dominant left-sided thyroid nodule on Ultrasound in 12/2014  Follow-up showed the same size of the nodules  TSH was 1.6 done by PCP in 06/2019   Lab Results  Component Value Date   TSH 2.11 04/28/2017     HYPERTENSION:  on  ramipril 5 mg, 2  daily and also HCTZ 25 mg from his cardiologist with generally good control Blood pressure  higher on first measurement in the office today  BP Readings from Last 3 Encounters:  04/08/21 128/78  01/03/21 126/78  11/13/20 138/72     LABS:  Office Visit on 04/08/2021  Component Date Value Ref Range Status   Hemoglobin A1C 04/08/2021 7.0 (A) 4.0 - 5.6 % Final    Physical Examination:  BP 128/78   Pulse 75   Ht 5\' 8"  (1.727 m)   Wt 222 lb 9.6 oz (101 kg)   SpO2 96%   BMI 33.85 kg/m     ASSESSMENT:  Diabetes type 2 on insulin     See history of present illness for detailed discussion of  current management, blood sugar patterns and problems identified  His A1c is somewhat better at 7% and improving since earlier this year  He is on high doses of insulin using Tresiba and U-500 insulin regimen, Farxiga 10 mg, Victoza and metformin  With using the freestyle libre sensor since after his last visit he has had much better understanding of his blood sugar patterns  However he has not adjusted his insulin despite having average readings close to 200 most of the day  Blood sugars are better overnight but lowest average is only 145  Appears to be needing greater amount of insulin during the daytime compared to overnight He thinks his blood sugars are slightly better with resuming Victoza recently With increasing his daytime insulin he likely may not need further increase at suppertime and possibly a decrease  HYPERTENSION: Well-controlled with ramipril 10 mg and HCTZ  LIPIDS: Needs fasting labs today  PLAN:  He was explained his blood sugar patterns and need for greater insulin doses at breakfast and lunch to counteract high  readings  He also has a dawn phenomenon and needs to take his morning insulin as soon as he wakes up  For now he will take 45 minutes on waking up with 40 or 45 at lunchtime based on his meal size but continue about 50 units at suppertime  Continue to increase activity level Discussed blood sugar targets before and after meals He will only reduce his 09-06-1975 if getting up with relatively low sugars overnight Also  periodically check fingersticks to compare with libre No change in blood pressure medications Consider insulin pump if he still has difficulty getting consistent control    Total visit time including counseling = 30 minutes  Patient Instructions  HUMULIN R 45 units on waking up 40-45 before lunch and 50 at supper  Check blood sugars on waking up 7 days a week  Also check blood sugars about 2 hours after meals and do this after different meals by rotation  Recommended blood sugar levels on waking up are 90-130 and about 2 hours after meal is 130-160  Please bring your blood sugar monitor to each visit, thank you        Reather Littler 04/08/2021, 11:29 AM   Note: This office note was prepared with Dragon voice recognition system technology. Any transcriptional errors that result from this process are unintentional.  Addendum: Triglycerides still over 200, needs 4 g of fish oil daily instead of 1

## 2021-04-08 NOTE — Patient Instructions (Addendum)
HUMULIN R 45 units on waking up 40-45 before lunch and 50 at supper  Check blood sugars on waking up 7 days a week  Also check blood sugars about 2 hours after meals and do this after different meals by rotation  Recommended blood sugar levels on waking up are 90-130 and about 2 hours after meal is 130-160  Please bring your blood sugar monitor to each visit, thank you

## 2021-04-16 ENCOUNTER — Other Ambulatory Visit: Payer: Self-pay | Admitting: Endocrinology

## 2021-04-23 ENCOUNTER — Other Ambulatory Visit: Payer: Self-pay | Admitting: Endocrinology

## 2021-04-23 ENCOUNTER — Telehealth: Payer: Self-pay | Admitting: Endocrinology

## 2021-04-23 NOTE — Telephone Encounter (Signed)
[facility] pharmacy calling in voiced that they want to confirm that pt is supposed to be talking all three insulins. [facility] states that they have not gotten the prescription  TRESIBA FLEXTOUCH 200 UNIT/ML FlexTouch Pen   Pharmacy states that they did not receive that prescription    [id number] - [place], [place] - [address]  Pam from [facility] would like a call back [phone]

## 2021-04-24 NOTE — Telephone Encounter (Signed)
Rx was sent again today and confirmed that pharmacy received at 1:08pm

## 2021-05-06 ENCOUNTER — Other Ambulatory Visit: Payer: Self-pay | Admitting: Endocrinology

## 2021-05-29 ENCOUNTER — Other Ambulatory Visit: Payer: Self-pay | Admitting: Cardiology

## 2021-06-05 ENCOUNTER — Other Ambulatory Visit: Payer: Self-pay | Admitting: Cardiology

## 2021-06-06 ENCOUNTER — Other Ambulatory Visit: Payer: Self-pay | Admitting: *Deleted

## 2021-06-06 MED ORDER — ATORVASTATIN CALCIUM 40 MG PO TABS: ORAL_TABLET | ORAL | 2 refills | Status: DC

## 2021-06-13 ENCOUNTER — Other Ambulatory Visit: Payer: Self-pay

## 2021-06-13 ENCOUNTER — Encounter: Payer: Self-pay | Admitting: Cardiology

## 2021-06-13 ENCOUNTER — Ambulatory Visit: Payer: Medicare HMO | Admitting: Cardiology

## 2021-06-13 DIAGNOSIS — I6523 Occlusion and stenosis of bilateral carotid arteries: Secondary | ICD-10-CM | POA: Diagnosis not present

## 2021-06-13 DIAGNOSIS — R079 Chest pain, unspecified: Secondary | ICD-10-CM | POA: Insufficient documentation

## 2021-06-13 DIAGNOSIS — E7800 Pure hypercholesterolemia, unspecified: Secondary | ICD-10-CM | POA: Diagnosis not present

## 2021-06-13 DIAGNOSIS — I10 Essential (primary) hypertension: Secondary | ICD-10-CM

## 2021-06-13 DIAGNOSIS — I2510 Atherosclerotic heart disease of native coronary artery without angina pectoris: Secondary | ICD-10-CM | POA: Diagnosis not present

## 2021-06-13 DIAGNOSIS — E119 Type 2 diabetes mellitus without complications: Secondary | ICD-10-CM | POA: Insufficient documentation

## 2021-06-13 NOTE — Assessment & Plan Note (Addendum)
On atorvastatin 40 mg, prior LDL 63 most recent 85.  Was also on Vascepa excellent but had to stop because insurance wont cover.  Help reduce triglycerides.  May wish to go ahead and start Zetia 10 mg a day to also help lower his LDL.

## 2021-06-13 NOTE — Assessment & Plan Note (Signed)
Prior hemoglobin A1c 7.0.  Improved.  Excellent.  Continue with good overall blood pressure control.  Today borderline elevated.  Previously HCTZ was added.  Dr. Lucianne Muss has been watching his diabetes closely.

## 2021-06-13 NOTE — Assessment & Plan Note (Signed)
Father had carotid endarterectomy.  Continue with statin aspirin.  Most recently checked mild.

## 2021-06-13 NOTE — Progress Notes (Signed)
Cardiology Office Note:    Date:  06/13/2021   ID:  [name] [name], DOB Mar 22, 1951, MRN [id number]  PCP:  Creola Corn, MD   [address] [facility] Providers Cardiologist:  Donato Schultz, MD     Referring MD: Creola Corn, MD    History of Present Illness:    [name] [name] is a 70 y.o. male here for follow-up coronary artery disease post CABG and PCI to RCA prior to bypass.  Retired Emergency planning/management officer.  Back issue steroid injections.  HCTZ in the past for blood pressure.  Denies any fevers chills nausea vomiting syncope bleeding.  Back surgery x 2 once a [facility]. Playing golf now.  He is very impressed with the physician over there that helped him with his back.  Has had some musculoskeletal/GI leg discomfort at times left of sternum.  Had some numbness post bypass.  Past Medical History:  Diagnosis Date   Allergy    1-2 x a year    Aortic valve sclerosis    echo june 2009   Arthritis    fingers, back    CAD (coronary artery disease)    a. 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 b. s/p CABG 04/2011.   Carotid artery disease (HCC)    Doppler, preop, September, 2012,  mild bilateral plaque with no significant stenoses   Diabetes mellitus    Dizziness    June, 2013   Ejection fraction    a. Ejection fraction 60%, echo, June, 2009. b. EF preserved by cath 2012.   Food poisoning    age 80   GERD (gastroesophageal reflux disease)    past hx    Hx of CABG    [name] [name],  x4  May 01, 2011 limited to the LAD, left radial artery to the OM, SVG to diagonal, SVG to posterior descending   Hyperlipidemia    on meds    Hypertension    controlled    Muscle ache    on Crestor  09/04/09 CPK 193(7-232) tolerated simvastatin in the past   Neuromuscular disorder (HCC)    nerves down right leg    PVC (premature ventricular contraction) 06/2009   November, 2010   Rash     Past Surgical History:  Procedure Laterality Date   BACK SURGERY     coccyx bone  removed     COLONOSCOPY     CORONARY ARTERY BYPASS GRAFT  05/01/2011   CABG x4 with LIMA to LAD, LRA to OM, SVG to D1, SVG to PDA, EVH via left thigh   Intraoperative transesophageal echocardiography.  05/01/2011   pilonidal cystectomy     POLYPECTOMY     TIBIA FRACTURE SURGERY      Current Medications: Current Meds  Medication Sig   aspirin 81 MG tablet Take 81 mg by mouth daily.   atorvastatin (LIPITOR) 40 MG tablet TAKE 1 TABLET BY MOUTH EVERY DAY   Continuous Blood Gluc Receiver (DEXCOM G6 RECEIVER) DEVI Use as direction to check sugar daily.   Continuous Blood Gluc Sensor (DEXCOM G6 SENSOR) MISC Use to monitor blood sugar, change after 10 days   Continuous Blood Gluc Transmit (DEXCOM G6 TRANSMITTER) MISC Change 1 Device every 90 days.   dapagliflozin propanediol (FARXIGA) 10 MG TABS tablet Take 1 tablet (10 mg total) by mouth daily.   fenofibrate micronized (LOFIBRA) 200 MG capsule TAKE 1 CAPSULE BY MOUTH EVERY DAY   glucose blood (ONETOUCH VERIO) test strip USE AS DIRECTED TO  TEST SUGAR ONCE DAILY   glucose blood test strip USE TO TEST BLOOD SUGAR TWICE DAILY DX CODE E11.65   hydrochlorothiazide (HYDRODIURIL) 25 MG tablet Take 1 tablet (25 mg total) by mouth daily. Pt needs to keep upcoming appt in Nov for additional refills   insulin regular human CONCENTRATED (HUMULIN R U-500 KWIKPEN) 500 UNIT/ML KwikPen INJECT 20 UNITS UNDER THE SKIN BEFORE BREAKFAST, 30 UNITS AT LUNCH, 35 UNITS AT DINNER, MAY CHANGE BY 5 UNITS BASED ON MEALS   Lancets (ONETOUCH DELICA PLUS LANCET30G) MISC USE ONCE DAILY TO TEST BLOOD SUGAR   metFORMIN (GLUCOPHAGE-XR) 500 MG 24 hr tablet TAKE 4 TABLETS BY MOUTH DAILY WITH SUPPER   NOVOFINE PEN NEEDLE 32G X 6 MM MISC USE TO INJECT INSULIN TWICE DAILY.   Omega-3 Fatty Acids (FISH OIL PO) Take by mouth.   ramipril (ALTACE) 5 MG capsule TAKE 1 CAPSULE(5 MG) BY MOUTH TWICE DAILY   TRESIBA FLEXTOUCH 200 UNIT/ML FlexTouch Pen ADMINISTER 144 UNITS UNDER THE SKIN DAILY  WITH SUPPER   VICTOZA 18 MG/3ML SOPN ADMINISTER 1.8 MG UNDER THE SKIN AT THE SAME TIME EVERY DAY   [DISCONTINUED] icosapent Ethyl (VASCEPA) 1 g capsule Take 2 capsules (2 g total) by mouth 2 (two) times daily.   Current Facility-Administered Medications for the 06/13/21 encounter (Office Visit) with Jake Bathe, MD  Medication   0.9 %  sodium chloride infusion     Allergies:   Rosuvastatin   Social History   Socioeconomic History   Marital status: Single    Spouse name: Not on file   Number of children: Not on file   Years of education: Not on file   Highest education level: Not on file  Occupational History   Occupation: Emergency planning/management officer   Tobacco Use   Smoking status: Never   Smokeless tobacco: Never  Substance and Sexual Activity   Alcohol use: No   Drug use: No   Sexual activity: Not on file  Other Topics Concern   Not on file  Social History Narrative   Not on file   Social Determinants of Health   Financial Resource Strain: Not on file  Food Insecurity: Not on file  Transportation Needs: Not on file  Physical Activity: Not on file  Stress: Not on file  Social Connections: Not on file     Family History: The patient's family history includes Cancer in his maternal grandfather; Diabetes in his father, mother, paternal aunt, and paternal uncle; Hearing loss in his father and paternal grandfather; Heart attack in his paternal grandfather; Heart attack (age of onset: 45) in his father; Heart failure in his maternal grandmother; Hypertension in his father; Prostate cancer in his maternal grandfather; Thyroid disease in his father. There is no history of Stroke, Colon cancer, Colon polyps, Esophageal cancer, Rectal cancer, or Stomach cancer.  ROS:   Please see the history of present illness.     All other systems reviewed and are negative.  EKGs/Labs/Other Studies Reviewed:    The following studies were reviewed today: Prior office notes echocardiogram records  reviewed  EKG:  EKG is  ordered today.  The ekg ordered today demonstrates sinus rhythm 83 PVC noted no other abnormalities  Recent Labs: 04/08/2021: ALT 27; BUN 14; Creatinine, Ser 0.74; Potassium 3.9; Sodium 139  Recent Lipid Panel    Component Value Date/Time   CHOL 113 04/08/2021 1136   TRIG 210.0 (H) 04/08/2021 1136   HDL 30.00 (L) 04/08/2021 1136   CHOLHDL 4 04/08/2021  1136   VLDL 42.0 (H) 04/08/2021 1136   LDLCALC 85 06/17/2020 1154   LDLDIRECT 55.0 04/08/2021 1136     Risk Assessment/Calculations:          Physical Exam:    VS:  BP 140/80 (BP Location: Left Arm, Patient Position: Sitting, Cuff Size: Normal)   Pulse 83   Ht 5\' 8"  (1.727 m)   Wt 225 lb (102.1 kg)   SpO2 95%   BMI 34.21 kg/m     Wt Readings from Last 3 Encounters:  06/13/21 225 lb (102.1 kg)  04/08/21 222 lb 9.6 oz (101 kg)  01/03/21 222 lb (100.7 kg)     GEN:  Well nourished, well developed in no acute distress HEENT: Normal NECK: No JVD; No carotid bruits LYMPHATICS: No lymphadenopathy CARDIAC: Cardiacs bypass scar RRR, no murmurs, rubs, gallops RESPIRATORY:  Clear to auscultation without rales, wheezing or rhonchi  ABDOMEN: Soft, non-tender, non-distended MUSCULOSKELETAL:  No edema; No deformity  SKIN: Warm and dry NEUROLOGIC:  Alert and oriented x 3 PSYCHIATRIC:  Normal affect   ASSESSMENT:    1. Coronary artery disease involving native coronary artery of native heart without angina pectoris   2. Diabetes mellitus with coincident hypertension (HCC)   3. Pure hypercholesterolemia   4. Bilateral carotid artery stenosis   5. Chest pain of uncertain etiology    PLAN:    In order of problems listed above:  CAD (coronary artery disease) Overall doing well post CABG x4 LIMA to LAD left radial to obtuse marginal SVG to first diagonal and vein graft to descending posterior.  This was done in 2012 by Dr. 2013.  Continue with goal-directed medical therapy.  Aspirin atorvastatin ACE  inhibitor.  Diabetes mellitus with coincident hypertension (HCC) Prior hemoglobin A1c 7.0.  Improved.  Excellent.  Continue with good overall blood pressure control.  Today borderline elevated.  Previously HCTZ was added.  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  Pure hypercholesterolemia On atorvastatin 40 mg, prior LDL 63 most recent 85.  Was also on Vascepa excellent but had to stop because insurance wont cover.  Help reduce triglycerides.  May wish to go ahead and start Zetia 10 mg a day to also help lower his LDL.  Carotid artery disease [facility]) Father had carotid endarterectomy.  Continue with statin aspirin.  Most recently checked mild.  Chest pain of uncertain etiology We will keep an eye on this.  He took antacid and that seemed to help.  He also has some musculoskeletal type discomfort at times.  He will let [facility] know if things get worse.  Low threshold for further testing.      Medication Adjustments/Labs and Tests Ordered: Current medicines are reviewed at length with the patient today.  Concerns regarding medicines are outlined above.  Orders Placed This Encounter  Procedures   EKG 12-Lead   No orders of the defined types were placed in this encounter.   Patient Instructions  Medication Instructions:  The current medical regimen is effective;  continue present plan and medications.  *If you need a refill on your cardiac medications before your next appointment, please call your pharmacy*  Follow-Up: At [facility], you and your health needs are our priority.  As part of our continuing mission to provide you with exceptional heart care, we have created designated Provider Care Teams.  These Care Teams include your primary Cardiologist (physician) and Advanced Practice Providers (APPs -  Physician Assistants and Nurse Practitioners) who all work together  to provide you with the care you need, when you need it.  We recommend signing up for the patient portal  called "MyChart".  Sign up information is provided on this After Visit Summary.  MyChart is used to connect with patients for Virtual Visits (Telemedicine).  Patients are able to view lab/test results, encounter notes, upcoming appointments, etc.  Non-urgent messages can be sent to your provider as well.   To learn more about what you can do with MyChart, go to [email or web address].    Your next appointment:   1 year(s)  The format for your next appointment:   In Person  Provider:   Donato Schultz, MD    Thank you for choosing [facility]!!     Signed, Donato Schultz, MD  06/13/2021 5:29 PM    [facility]

## 2021-06-13 NOTE — Assessment & Plan Note (Signed)
We will keep an eye on this.  He took antacid and that seemed to help.  He also has some musculoskeletal type discomfort at times.  He will let us know if things get worse.  Low threshold for further testing.

## 2021-06-13 NOTE — Patient Instructions (Signed)
Medication Instructions:  The current medical regimen is effective;  continue present plan and medications.  *If you need a refill on your cardiac medications before your next appointment, please call your pharmacy*  Follow-Up: At [facility], you and your health needs are our priority.  As part of our continuing mission to provide you with exceptional heart care, we have created designated Provider Care Teams.  These Care Teams include your primary Cardiologist (physician) and Advanced Practice Providers (APPs -  Physician Assistants and Nurse Practitioners) who all work together to provide you with the care you need, when you need it.  We recommend signing up for the patient portal called "MyChart".  Sign up information is provided on this After Visit Summary.  MyChart is used to connect with patients for Virtual Visits (Telemedicine).  Patients are able to view lab/test results, encounter notes, upcoming appointments, etc.  Non-urgent messages can be sent to your provider as well.   To learn more about what you can do with MyChart, go to [email or web address].    Your next appointment:   1 year(s)  The format for your next appointment:   In Person  Provider:   Mark Skains, MD   Thank you for choosing [facility]!!    

## 2021-06-13 NOTE — Assessment & Plan Note (Signed)
Overall doing well post CABG x4 LIMA to LAD left radial to obtuse marginal SVG to first diagonal and vein graft to descending posterior.  This was done in 2012 by Dr. [name].  Continue with goal-directed medical therapy.  Aspirin atorvastatin ACE inhibitor.

## 2021-06-19 ENCOUNTER — Other Ambulatory Visit: Payer: Self-pay | Admitting: Endocrinology

## 2021-06-23 ENCOUNTER — Other Ambulatory Visit: Payer: Self-pay

## 2021-06-23 ENCOUNTER — Telehealth: Payer: Self-pay | Admitting: Endocrinology

## 2021-06-23 DIAGNOSIS — E1165 Type 2 diabetes mellitus with hyperglycemia: Secondary | ICD-10-CM

## 2021-06-23 DIAGNOSIS — Z794 Long term (current) use of insulin: Secondary | ICD-10-CM

## 2021-06-23 MED ORDER — METFORMIN HCL ER 500 MG PO TB24: ORAL_TABLET | ORAL | 3 refills | Status: DC

## 2021-06-23 NOTE — Telephone Encounter (Signed)
Pt called requesting refill on metFORMIN (GLUCOPHAGE-XR) 500 MG 24 hr tablet  [id number] Joylene John, [place] - [address] AT [facility] [address] Phone:  [phone]  Fax:  [phone]      CALLED PT WITH UPDATE ASAP: Phone:  [phone]

## 2021-06-23 NOTE — Telephone Encounter (Signed)
Spoke with patient Rx was sent on 11/18. Patient also upset about hold times when calling office. Patient has picked up medication and a Rx for 90 day supply was sent for when patient runs out.

## 2021-07-09 ENCOUNTER — Other Ambulatory Visit: Payer: Self-pay

## 2021-07-09 ENCOUNTER — Encounter: Payer: Self-pay | Admitting: Endocrinology

## 2021-07-09 ENCOUNTER — Ambulatory Visit: Payer: Medicare HMO | Admitting: Endocrinology

## 2021-07-09 VITALS — BP 146/78 | HR 82 | Ht 68.0 in | Wt 226.2 lb

## 2021-07-09 DIAGNOSIS — E1165 Type 2 diabetes mellitus with hyperglycemia: Secondary | ICD-10-CM

## 2021-07-09 DIAGNOSIS — Z794 Long term (current) use of insulin: Secondary | ICD-10-CM | POA: Diagnosis not present

## 2021-07-09 DIAGNOSIS — I10 Essential (primary) hypertension: Secondary | ICD-10-CM

## 2021-07-09 LAB — POCT GLYCOSYLATED HEMOGLOBIN (HGB A1C): Hemoglobin A1C: 7.5 % — AB (ref 4.0–5.6)

## 2021-07-09 MED ORDER — DAPAGLIFLOZIN PROPANEDIOL 10 MG PO TABS: 10.0000 mg | ORAL_TABLET | Freq: Every day | ORAL | 1 refills | Status: DC

## 2021-07-09 NOTE — Patient Instructions (Addendum)
Take R upto 65 in am and at least 50 at lunch and 70 at supper  Start OZEMPIC injections by dialing 0.25 mg on the pen as shown once weekly on the same day of the week.   You may inject in the sides of the stomach, outer thigh or arm as indicated in the brochure given. If you have any difficulties using the pen see the video at [email or web address]  You will feel fullness of the stomach with starting the medication and should try to keep the portions at meals small.  You may experience nausea in the first few days which usually gets better over time    After 2 weeks increase the dose to 0.5 mg weekly After this need to Rx 1mg   Fish oil 2 twice daily  With Farxiga take 1 Ramipril

## 2021-07-09 NOTE — Progress Notes (Signed)
Patient ID: [name] [name], male   DOB: 18-May-1951, 70 y.o.   MRN: [id number]           Reason for Appointment: Follow-up for Type 2 Diabetes    History of Present Illness:          Date of diagnosis of type 2 diabetes mellitus: 2011        Background history:  He was probably started on metformin at diagnosis and subsequently given Amaryl also Subsequently had been on Janumet and more recently this was changed to [facility]. Because of poor control he was given Levemir in addition to his Kombiglyze in 01/2013 The dose was progressively increased from initial dose of 12 units He did have somewhat better control right after starting insulin with A1c below 7% but only one time His blood sugars had been consistently high since 2014 with A1c at least 8%  Recent history:   INSULIN regimen is described as: [name] 144 units daily.   Humulin R U-500, 45 units at breakfast, 40-60 at lunch, and 60  at dinner  Non-insulin hypoglycemic drugs the patient is taking are: Metformin ER 2 g, Victoza 1.8 mg daily    A1c has been as low as 6.8, now 7.5   Current blood sugar patterns, management and problems identified: He has increased his Humulin R as directed in the morning but still taking about the same for dinnertime coverage However he still appears to be very insulin resistant as even with higher doses he still has hyperglycemia He says that blood sugars tend to go up even when he is not eating Overnight blood sugars are somewhat variable but frequently high Postprandial readings are somewhat variable and not usually rising except when he is eating more carbohydrates or high-fat meal such as this morning He is playing golf a couple of times a week but no other formal exercise Has had a stable weight previously but now appears to be slightly higher Usually trying to take his Humulin R 30-minute before eating Has been fairly consistent with his Victoza He did not appear to benefit from  [facility] previously and not clear why this was stopped earlier this year No hypoglycemia despite large doses of basal insulin  Side effects from medications have been: ?  Dizziness from [facility]  Interpretation of the download for the last 2 weeks is as follows  Summary of blood sugar patterns His blood sugars are consistently high at all times with only some improvement at times overnight  Blood sugars show some variability from day-to-day but generally start going up in the mornings and may stay up the rest of the day  OVERNIGHT blood sugars are somewhat variable with occasionally persistently high readings but averaging around 170  Premeal readings are relatively higher at dinnertime around 200 otherwise lowest at breakfast  No hypoglycemia POSTPRANDIAL readings are increasing mostly after breakfast but to a variable extent At lunch and dinnertime blood sugars do not show significant rise except occasionally late evening    CGM use % of time 95  2-week average/GV 192/25  Time in range    41    %  % Time Above 180 47+12  % Time above 250   % Time Below 70 0     PRE-MEAL Fasting Lunch Dinner Bedtime Overall  Glucose range:       Averages: 169 192 202  192   POST-MEAL PC Breakfast PC Lunch PC Dinner  Glucose range:     Averages: 210 185  221   Prior  CGM use % of time 58  2-week average/GV 181  Time in range        %  % Time Above 180 39+10  % Time above 250   % Time Below 70 1     PRE-MEAL Fasting Lunch Dinner Bedtime Overall  Glucose range:       Averages: 154  186 203 187    POST-MEAL PC Breakfast PC Lunch PC Dinner  Glucose range:     Averages: 196 200 226     FASTING recent readings 183-294 with most readings between 180-200 and 90-day average 223  PREVIOUS fasting range 174-290 with MEDIAN 249 with only 1 reading below 200     Self-care: The diet that the patient has been following is: tries to limit sweet drinks.      Meal times: Breakfast: 9 AM  Lunch: 1 PM Dinner: 6 PM   Typical meal intake: Breakfast can occasionally be a fast food biscuit, lunch is a sandwich or vegetables, variable meal at suppertime.                Dietician visit, most recent: 6/16  Weight history: Highest  previously 256  Wt Readings from Last 3 Encounters:  07/09/21 226 lb 3.2 oz (102.6 kg)  06/13/21 225 lb (102.1 kg)  04/08/21 222 lb 9.6 oz (101 kg)    Glycemic control:   Lab Results  Component Value Date   HGBA1C 7.5 (A) 07/09/2021   HGBA1C 7.0 (A) 04/08/2021   HGBA1C 7.5 (A) 01/03/2021   Lab Results  Component Value Date   MICROALBUR 1.8 09/24/2020   LDLCALC 85 06/17/2020   CREATININE 0.74 04/08/2021    Other active problems: See review of systems    Allergies as of 07/09/2021       Reactions   Rosuvastatin Other (See Comments)   REACTION: myalgias        Medication List        Accurate as of July 09, 2021  2:59 PM. If you have any questions, ask your nurse or doctor.          aspirin 81 MG tablet Take 81 mg by mouth daily.   atorvastatin 40 MG tablet Commonly known as: LIPITOR TAKE 1 TABLET BY MOUTH EVERY DAY   dapagliflozin propanediol 10 MG Tabs tablet Commonly known as: Farxiga Take 1 tablet (10 mg total) by mouth daily.   Dexcom G6 Receiver Devi Use as direction to check sugar daily.   Dexcom G6 Sensor Misc Use to monitor blood sugar, change after 10 days   Dexcom G6 Transmitter Misc Change 1 Device every 90 days.   fenofibrate micronized 200 MG capsule Commonly known as: LOFIBRA TAKE 1 CAPSULE BY MOUTH EVERY DAY   FISH OIL PO Take by mouth.   glucose blood test strip USE TO TEST BLOOD SUGAR TWICE DAILY DX CODE E11.65   OneTouch Verio test strip Generic drug: glucose blood USE AS DIRECTED TO TEST SUGAR ONCE DAILY   HumuLIN R U-500 KwikPen 500 UNIT/ML KwikPen Generic drug: insulin regular human CONCENTRATED INJECT 20 UNITS UNDER THE SKIN BEFORE BREAKFAST, 30 UNITS AT LUNCH, 35 UNITS AT  DINNER, MAY CHANGE BY 5 UNITS BASED ON MEALS   hydrochlorothiazide 25 MG tablet Commonly known as: HYDRODIURIL Take 1 tablet (25 mg total) by mouth daily. Pt needs to keep upcoming appt in Nov for additional refills   metFORMIN 500 MG 24 hr tablet Commonly known as: GLUCOPHAGE-XR TAKE  4 TABLETS BY MOUTH DAILY WITH SUPPER   Novofine Pen Needle 32G X 6 MM Misc Generic drug: Insulin Pen Needle USE TO INJECT INSULIN TWICE DAILY.   OneTouch Delica Plus Lancet30G Misc USE ONCE DAILY TO TEST BLOOD SUGAR   ramipril 5 MG capsule Commonly known as: ALTACE TAKE 1 CAPSULE(5 MG) BY MOUTH TWICE DAILY   Tresiba FlexTouch 200 UNIT/ML FlexTouch Pen Generic drug: insulin degludec ADMINISTER 144 UNITS UNDER THE SKIN DAILY WITH SUPPER   Victoza 18 MG/3ML Sopn Generic drug: liraglutide ADMINISTER 1.8 MG UNDER THE SKIN AT THE SAME TIME EVERY DAY        Allergies:  Allergies  Allergen Reactions   Rosuvastatin Other (See Comments)    REACTION: myalgias    Past Medical History:  Diagnosis Date   Allergy    1-2 x a year    Aortic valve sclerosis    echo june 2009   Arthritis    fingers, back    CAD (coronary artery disease)    a. 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 b. s/p CABG 04/2011.   Carotid artery disease (HCC)    Doppler, preop, September, 2012,  mild bilateral plaque with no significant stenoses   Diabetes mellitus    Dizziness    June, 2013   Ejection fraction    a. Ejection fraction 60%, echo, June, 2009. b. EF preserved by cath 2012.   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  05/01/2011   pilonidal cystectomy     POLYPECTOMY     TIBIA FRACTURE SURGERY      Family History  Problem Relation Age of Onset   Heart attack Paternal Grandfather        late 69s   Hearing loss Paternal Grandfather    Hypertension Father    Heart attack Father 79       bypass   Diabetes Father    Hearing loss Father    Thyroid disease Father    Cancer Maternal Grandfather        site unknown   Prostate cancer Maternal Grandfather    Diabetes Paternal Aunt        X 3   Heart failure Maternal Grandmother    Diabetes Mother    Diabetes Paternal Uncle    Stroke Neg Hx    Colon cancer Neg Hx    Colon polyps Neg Hx    Esophageal cancer Neg Hx    Rectal cancer Neg Hx  Stomach cancer Neg Hx     Social History:  reports that he has never smoked. He has never used smokeless tobacco. He reports that he does not drink alcohol and does not use drugs.    Review of Systems   Lipid history: He has been taking fish oil and fenofibrate for baseline high triglycerides along with generic Lipitor 40 mg  Triglycerides have been variably high; his insurance will not cover Vascepa or Lovaza He is still taking OTC omega-3 fish oil 2/day, was advised to go up to 4 a day    Lab Results  Component Value Date   CHOL 113 04/08/2021   HDL 30.00 (L) 04/08/2021   LDLCALC 85 06/17/2020   LDLDIRECT 55.0 04/08/2021   TRIG 210.0 (H) 04/08/2021   CHOLHDL 4 04/08/2021            THYROID: He had a 2.4 cm dominant left-sided thyroid nodule on Ultrasound in 12/2014  Follow-up showed the same size of the  nodules  TSH was 1.6 done by PCP in 06/2019   Lab Results  Component Value Date   TSH 2.11 04/28/2017     HYPERTENSION:  on  ramipril 5 mg, twice daily and also HCTZ 25 mg from his cardiologist with generally good control He does not think his home monitor is reliable Blood pressure appears to be relatively higher today checked twice   BP Readings from Last 3 Encounters:  07/09/21 (!) 146/78  06/13/21 140/80  04/08/21 128/78     LABS:  Office Visit on 07/09/2021  Component Date Value Ref Range Status   Hemoglobin A1C 07/09/2021 7.5 (A)  4.0 - 5.6 % Final    Physical Examination:  BP (!) 146/78 (BP Location: Left Arm, Patient Position: Sitting, Cuff Size: Normal)   Pulse 82   Ht 5\' 8"  (1.727 m)   Wt 226 lb 3.2 oz (102.6 kg)   SpO2 96%   BMI 34.39 kg/m     ASSESSMENT:  Diabetes type 2 on insulin     See history of present illness for detailed discussion of  current management, blood sugar patterns and problems identified  His A1c is somewhat higher at 7.5, previously was at 7%   He is on high doses of insulin using and U-500 insulin 3 times a day regimen, Victoza and metformin  Blood sugars are relatively higher than the last time and his weight has gone up Does not have any change in recent dietary intake However may be not exercising as much  He still appears to be needing more insulin at all times except some nights his blood sugars are better Postprandial readings do not spike excessively except when he is eating more carbohydrate such as this morning Usually trying to limit carbohydrates at breakfast  HYPERTENSION: Fair control with ramipril 10 mg and HCTZ   PLAN:  He was advised to continue increasing his insulin doses at mealtimes until blood sugars during the day are at target  He will restart [name] with 5 mg dose and increase further if needed With this he will reduce his ramipril to 5 mg Likely will need to increase his morning  insulin the most since blood sugars tend to go up during the day and also increase by at least 10 units at lunch and dinner Continue to adjust based on meal size and carbohydrates More regular exercise He will likely benefit from a weekly dose of Ozempic rather than Victoza as the dose can be escalated further  Explained to the patient in general actions of GLP-1 drugs are, benefits of reduction of hunger sensation and improved insulin secretion.  Discussed the benefit of weight loss with these medications. Explained possible side effects of OZEMPIC, most commonly nausea that usually improves over time.  Also explained safety information associated with the medication Demonstrated the medication injection device and injection technique to the patient.  To start the injections with the 0.25 mg dosage weekly for the first only 2 injections and then go up to 0.5 mg weekly if no continued nausea.  Co-pay card given He will also call when he finishes the sample to get on the 1 mg dose Follow-up in 2 months Consider OmniPod insulin pump if he still has difficulty getting consistent control   He will have labs with his PCP at the upcoming physical including lipids  He will increase his FISH oil to 2 capsules twice a day  Total visit time including counseling = 30 minutes  Patient Instructions  Take R upto 65 in am and at least 50 at lunch and 70 at supper  Start OZEMPIC injections by dialing 0.25 mg on the pen as shown once weekly on the same day of the week.   You may inject in the sides of the stomach, outer thigh or arm as indicated in the brochure given. If you have any difficulties using the pen see the video at [email or web address]  You will feel fullness of the stomach with starting the medication and should try to keep the portions at meals small.  You may experience nausea in the first few days which usually gets better over time    After 2 weeks increase the dose to 0.5 mg weekly After  this need to Rx 1mg   Fish oil 2 twice daily  With take 1 Ramipril        Marcelline Deist 07/09/2021, 2:59 PM   Note: This office note was prepared with Dragon voice recognition system technology. Any transcriptional errors that result from this process are unintentional.

## 2021-07-21 ENCOUNTER — Telehealth: Payer: Self-pay | Admitting: Endocrinology

## 2021-07-21 ENCOUNTER — Other Ambulatory Visit: Payer: Self-pay | Admitting: Endocrinology

## 2021-07-21 DIAGNOSIS — Z794 Long term (current) use of insulin: Secondary | ICD-10-CM

## 2021-07-21 MED ORDER — TRESIBA FLEXTOUCH 200 UNIT/ML ~~LOC~~ SOPN: PEN_INJECTOR | SUBCUTANEOUS | 1 refills | Status: DC

## 2021-07-21 NOTE — Telephone Encounter (Signed)
RX has now been sent into pharmacy

## 2021-07-21 NOTE — Telephone Encounter (Signed)
MEDICATION: TRESIBA FLEXTOUCH 200 UNIT/ML FlexTouch Pen  PHARMACY:   [id number] Joylene John, [place] - [address] AT [facility] [address] Phone:  [phone]  Fax:  [phone]      HAS THE PATIENT CONTACTED THEIR PHARMACY?  yes  IS THIS A 90 DAY SUPPLY : 2 day supply  IS PATIENT OUT OF MEDICATION:   IF NOT; HOW MUCH IS LEFT:   LAST APPOINTMENT DATE: @12 /19/2022  NEXT APPOINTMENT DATE:@2 /15/2023  DO WE HAVE YOUR PERMISSION TO LEAVE A DETAILED MESSAGE?:  OTHER COMMENTS: Pharmacy needs Providers approval to fill the above.   **Let patient know to contact pharmacy at the end of the day to make sure medication is ready. **  ** Please notify patient to allow 48-72 hours to process**  **Encourage patient to contact the pharmacy for refills or they can request refills through [facility]**

## 2021-08-01 ENCOUNTER — Other Ambulatory Visit: Payer: Self-pay | Admitting: Endocrinology

## 2021-08-11 ENCOUNTER — Telehealth: Payer: Self-pay | Admitting: Endocrinology

## 2021-08-11 NOTE — Telephone Encounter (Signed)
MEDICATION: BD PEN NEEDLE 32G X 6 MM MISC   (Patient states he is having trouble getting Novofine brand)  PHARMACY:   [id number] - [place], [place] - [address] Phone:  [phone]  Fax:  [phone]      HAS THE PATIENT CONTACTED THEIR PHARMACY?  Yes-requires new RX  IS THIS A 90 DAY SUPPLY : Yes, if possible  IS PATIENT OUT OF MEDICATION: No  IF NOT; HOW MUCH IS LEFT: Approx. 1 day  LAST APPOINTMENT DATE: @12 /30/2022  NEXT APPOINTMENT DATE:@2 /15/2023  DO WE HAVE YOUR PERMISSION TO LEAVE A DETAILED MESSAGE?:Yes  OTHER COMMENTS:    **Let patient know to contact pharmacy at the end of the day to make sure medication is ready. **  ** Please notify patient to allow 48-72 hours to process**  **Encourage patient to contact the pharmacy for refills or they can request refills through [facility]**

## 2021-08-12 ENCOUNTER — Other Ambulatory Visit: Payer: Self-pay

## 2021-08-12 MED ORDER — BD PEN NEEDLE MICRO U/F 32G X 6 MM MISC: 2 refills | Status: DC

## 2021-08-12 MED ORDER — BD PEN NEEDLE MICRO U/F 32G X 6 MM MISC: 2 refills | Status: AC

## 2021-08-12 NOTE — Telephone Encounter (Signed)
Rx sent in for patient to preferred pharmacy

## 2021-08-12 NOTE — Addendum Note (Signed)
Addended by: Cinda Quest on: 08/12/2021 01:44 PM   Modules accepted: Orders

## 2021-08-27 ENCOUNTER — Other Ambulatory Visit: Payer: Self-pay | Admitting: Cardiology

## 2021-09-10 ENCOUNTER — Telehealth: Payer: Self-pay

## 2021-09-10 DIAGNOSIS — E1165 Type 2 diabetes mellitus with hyperglycemia: Secondary | ICD-10-CM

## 2021-09-10 DIAGNOSIS — Z794 Long term (current) use of insulin: Secondary | ICD-10-CM

## 2021-09-10 NOTE — Telephone Encounter (Signed)
Pt called in stating he needs a Rx for ozempic. He is done with starter dose. Do you want patient to increase to 1mg / weekly?

## 2021-09-11 MED ORDER — OZEMPIC (1 MG/DOSE) 4 MG/3ML ~~LOC~~ SOPN: 1.0000 mg | PEN_INJECTOR | SUBCUTANEOUS | 2 refills | Status: DC

## 2021-09-17 ENCOUNTER — Ambulatory Visit: Payer: Medicare HMO | Admitting: Endocrinology

## 2021-09-17 ENCOUNTER — Encounter: Payer: Self-pay | Admitting: Endocrinology

## 2021-09-17 ENCOUNTER — Other Ambulatory Visit: Payer: Self-pay

## 2021-09-17 VITALS — BP 142/82 | HR 68 | Ht 68.0 in | Wt 220.2 lb

## 2021-09-17 DIAGNOSIS — I10 Essential (primary) hypertension: Secondary | ICD-10-CM | POA: Diagnosis not present

## 2021-09-17 DIAGNOSIS — E782 Mixed hyperlipidemia: Secondary | ICD-10-CM | POA: Diagnosis not present

## 2021-09-17 DIAGNOSIS — E1165 Type 2 diabetes mellitus with hyperglycemia: Secondary | ICD-10-CM

## 2021-09-17 DIAGNOSIS — Z794 Long term (current) use of insulin: Secondary | ICD-10-CM | POA: Diagnosis not present

## 2021-09-17 NOTE — Patient Instructions (Addendum)
Take 10 Units R less at lunch for lite meals  Cover supper with full dose of R based on meal size  Reduce Tresiba 10  if am sugars go <100

## 2021-09-17 NOTE — Progress Notes (Signed)
Patient ID: [name] [name], male   DOB: July 21, 1951, 71 y.o.   MRN: [id number]           Reason for Appointment: Follow-up for Type 2 Diabetes    History of Present Illness:          Date of diagnosis of type 2 diabetes mellitus: 2011        Background history:  He was probably started on metformin at diagnosis and subsequently given Amaryl also Subsequently had been on Janumet and more recently this was changed to [facility]. Because of poor control he was given Levemir in addition to his Kombiglyze in 01/2013 The dose was progressively increased from initial dose of 12 units He did have somewhat better control right after starting insulin with A1c below 7% but only one time His blood sugars had been consistently high since 2014 with A1c at least 8%  Recent history:   INSULIN regimen is described as: [place] 144 units daily.   Humulin R U-500, 45 units at breakfast, 50 at lunch, and 60  at dinner  Non-insulin hypoglycemic drugs the patient is taking are: Metformin ER 2 g, OZEMPIC 1.0 mg weekly   A1c has been as low as 6.8, last 7.5   Current blood sugar patterns, management and problems identified: He has switched from Victoza to [facility] in December  With this his blood sugars are significantly better overall including fasting Time in range has improved from 41 up to 77% with fairly complete monitoring  However he still tends to have relatively high readings in the evenings after dinner from inadequate insulin  He has not increased his suppertime dose a little bit but not consistently  If he is eating a lighter meal at lunchtime his blood sugars may be low normal before dinner and then he will take less insulin  He says he starts to get excessive urination with [place] and is only taking half tablet every other day  No hypoglycemia overnight with even 144 units of basal insulin He has been able to lose about 6 pounds He does try to take his Humulin up to 30 minutes before  eating  Side effects from medications have been: ?  Dizziness from [facility]  Interpretation of the download for the last 2 weeks is as follows  Summary of blood sugar patterns Hyperglycemia is occurring to a mild extent after breakfast or lunch but more frequently in the evening after 6 PM until midnight  Overnight blood sugars are generally well controlled with the lowest reading around 4 AM without hypoglycemia  Blood sugar spikes after meals are present periodically after lunch and dinner and rarely late morning    CGM use % of time 83  2-week average/GV 154  Time in range      77  % was 41  % Time Above 180 23  % Time above 250   % Time Below 70      PRE-MEAL Fasting Lunch Dinner Bedtime Overall  Glucose range:       Averages: 132  154 194 154   POST-MEAL PC Breakfast PC Lunch PC Dinner  Glucose range:     Averages: 167  168    prior  CGM use % of time 95  2-week average/GV 192/25  Time in range    41    %  % Time Above 180 47+12  % Time above 250   % Time Below 70 0     PRE-MEAL Fasting Lunch Dinner Bedtime  Overall  Glucose range:       Averages: 169 192 202  192   POST-MEAL PC Breakfast PC Lunch PC Dinner  Glucose range:     Averages: 210 185 221     Self-care: The diet that the patient has been following is: tries to limit sweet drinks.      Meal times: Breakfast: 9 AM Lunch: 1 PM Dinner: 6 PM   Typical meal intake: Breakfast can occasionally be a fast food biscuit, lunch is a sandwich or vegetables, variable meal at suppertime.                Dietician visit, most recent: 6/16  Weight history: Highest  previously 256  Wt Readings from Last 3 Encounters:  09/17/21 220 lb 3.2 oz (99.9 kg)  07/09/21 226 lb 3.2 oz (102.6 kg)  06/13/21 225 lb (102.1 kg)    Glycemic control:   Lab Results  Component Value Date   HGBA1C 7.5 (A) 07/09/2021   HGBA1C 7.0 (A) 04/08/2021   HGBA1C 7.5 (A) 01/03/2021   Lab Results  Component Value Date   MICROALBUR  1.8 09/24/2020   Nitro 85 06/17/2020   CREATININE 0.74 04/08/2021    Other active problems: See review of systems    Allergies as of 09/17/2021       Reactions   Rosuvastatin Other (See Comments)   REACTION: myalgias        Medication List        Accurate as of September 17, 2021  9:58 AM. If you have any questions, ask your nurse or doctor.          aspirin 81 MG tablet Take 81 mg by mouth daily.   atorvastatin 40 MG tablet Commonly known as: LIPITOR TAKE 1 TABLET BY MOUTH EVERY DAY   dapagliflozin propanediol 10 MG Tabs tablet Commonly known as: Farxiga Take 1 tablet (10 mg total) by mouth daily.   Dexcom G6 Receiver Devi Use as direction to check sugar daily.   Dexcom G6 Sensor Misc Use to monitor blood sugar, change after 10 days   Dexcom G6 Transmitter Misc Change 1 Device every 90 days.   fenofibrate micronized 200 MG capsule Commonly known as: LOFIBRA TAKE 1 CAPSULE BY MOUTH EVERY DAY   FISH OIL PO Take by mouth.   glucose blood test strip USE TO TEST BLOOD SUGAR TWICE DAILY DX CODE E11.65   OneTouch Verio test strip Generic drug: glucose blood USE AS DIRECTED TO TEST SUGAR ONCE DAILY   HumuLIN R U-500 KwikPen 500 UNIT/ML KwikPen Generic drug: insulin regular human CONCENTRATED INJECT 20 UNITS UNDER THE SKIN BEFORE BREAKFAST, 30 UNITS AT LUNCH, 35 UNITS AT DINNER, MAY CHANGE BY 5 UNITS BASED ON MEALS What changed: additional instructions   hydrochlorothiazide 25 MG tablet Commonly known as: HYDRODIURIL TAKE 1 TABLET BY MOUTH EVERY DAY   metFORMIN 500 MG 24 hr tablet Commonly known as: GLUCOPHAGE-XR TAKE 4 TABLETS BY MOUTH DAILY WITH SUPPER   Novofine Pen Needle 32G X 6 MM Misc Generic drug: Insulin Pen Needle USE TO INJECT INSULIN TWICE DAILY.   BD Pen Needle Micro U/F 32G X 6 MM Misc Generic drug: Insulin Pen Needle USE TO INJECT INSULIN TWICE DAILY   OneTouch Delica Plus [id number] Misc USE ONCE DAILY TO TEST BLOOD SUGAR    Ozempic (1 MG/DOSE) 4 MG/3ML Sopn Generic drug: Semaglutide (1 MG/DOSE) Inject 1 mg into the skin once a week.   ramipril 5 MG capsule Commonly known  as: ALTACE TAKE 1 CAPSULE(5 MG) BY MOUTH TWICE DAILY   Tresiba FlexTouch 200 UNIT/ML FlexTouch Pen Generic drug: insulin degludec ADMINISTER 144 UNITS UNDER THE SKIN DAILY WITH SUPPER What changed: how much to take   Victoza 18 MG/3ML Sopn Generic drug: liraglutide ADMINISTER 1.8 MG UNDER THE SKIN AT THE SAME TIME EVERY DAY        Allergies:  Allergies  Allergen Reactions   Rosuvastatin Other (See Comments)    REACTION: myalgias    Past Medical History:  Diagnosis Date   Allergy    1-2 x a year    Aortic valve sclerosis    echo june 2009   Arthritis    fingers, back    CAD (coronary artery disease)    a. 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 b. s/p CABG 04/2011.   Carotid artery disease (HCC)    Doppler, preop, September, 2012,  mild bilateral plaque with no significant stenoses   Diabetes mellitus    Dizziness    June, 2013   Ejection fraction    a. Ejection fraction 60%, echo, June, 2009. b. EF preserved by cath 2012.   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  05/01/2011   pilonidal cystectomy     POLYPECTOMY     TIBIA FRACTURE SURGERY      Family History  Problem Relation Age of Onset   Heart attack Paternal Grandfather        late 86s   Hearing loss Paternal Grandfather    Hypertension Father    Heart attack Father 68       bypass   Diabetes Father    Hearing loss Father    Thyroid disease Father    Cancer Maternal Grandfather        site unknown   Prostate cancer Maternal Grandfather    Diabetes Paternal Aunt        X 3   Heart failure Maternal Grandmother    Diabetes Mother    Diabetes Paternal Uncle    Stroke Neg Hx    Colon cancer Neg Hx    Colon polyps Neg Hx    Esophageal cancer Neg Hx    Rectal cancer Neg Hx    Stomach cancer Neg Hx     Social History:  reports that he has never smoked. He has never used smokeless tobacco. He reports that he does not drink alcohol and does not use drugs.    Review of  Systems   Lipid history: He has been taking fish oil and fenofibrate for baseline high triglycerides along with generic Lipitor 40 mg  Triglycerides have been variably high; his insurance will not cover Vascepa or Lovaza He is taking OTC omega-3 fish oil 2/day    Lab Results  Component Value Date   CHOL 113 04/08/2021   HDL 30.00 (L) 04/08/2021   LDLCALC 85 06/17/2020   LDLDIRECT 55.0 04/08/2021   TRIG 210.0 (H) 04/08/2021   CHOLHDL 4 04/08/2021            THYROID: He had a 2.4 cm dominant left-sided thyroid nodule on Ultrasound in 12/2014  Follow-up showed the same size of the nodules  TSH was 1.6 done by PCP in 06/2019   Lab Results  Component Value Date   TSH 2.11 04/28/2017     HYPERTENSION:  on  ramipril 5 mg and also HCTZ 25 mg from his cardiologist with generally good control He does not think his home monitor is reliable No apparent change in blood pressure with adding Farxiga 5 mg every other day He may have some whitecoat syndrome also  BP Readings from  Last 3 Encounters:  09/17/21 (!) 142/82  07/09/21 (!) 146/78  06/13/21 140/80     LABS:  No visits with results within 1 Week(s) from this visit.  Latest known visit with results is:  Office Visit on 07/09/2021  Component Date Value Ref Range Status   Hemoglobin A1C 07/09/2021 7.5 (A)  4.0 - 5.6 % Final    Physical Examination:  BP (!) 142/82    Pulse 68    Ht 5\' 8"  (1.727 m)    Wt 220 lb 3.2 oz (99.9 kg)    SpO2 98%    BMI 33.48 kg/m     ASSESSMENT:  Diabetes type 2 on insulin     See history of present illness for detailed discussion of  current management, blood sugar patterns and problems identified  His A1c is last higher at 7.5, previously was at 7%   He is on high doses of insulin using [name] and U-500 insulin 3 times a day regimen, Ozempic and metformin  Overall significantly doing better with switching from Victoza to Ozempic and now taking 1 mg since last week However he still has periodically high postprandial readings especially in the evenings  Also has better overnight readings with Ozempic compared to Victoza Likely has better satiety with 6 pound weight loss  Discussed that if his carbohydrate intake is still significant at suppertime he should not be reducing his mealtime coverage based on what he had to eat the previous meal  HYPERTENSION: Fairly good control with ramipril  and HCTZ No change with Marcelline Deist but will need follow-up renal functions  PLAN:  He we will continue 1 mg Ozempic  May consider 2 mg on at next visit if need be  Discussed that if his morning blood sugars are progressively lower below 100 he can reduce [name] by 10 units  Continue Farxiga every other day  He does need to take 60 to 70 units of Humulin R coverage at dinnertime when he is eating significant carbohydrates regardless of his Premeal blood sugar  Also can reduce lunchtime dose when he is eating a relatively low carbohydrate meals such as he did yesterday  continue  adjusting Humulin R based on meal size  Encouraged him to have regular walking program for exercise which he is currently not doing  Check lipids today  Total visit time including counseling = 30 minutes  There are no Patient Instructions on file for this visit.      Elayne Snare 09/17/2021, 9:58 AM   Note: This office note was prepared with Dragon voice recognition system technology. Any transcriptional errors that result from this process are unintentional.

## 2021-09-18 LAB — LIPID PANEL
Cholesterol: 111 mg/dL (ref 0–200)
HDL: 33.2 mg/dL — ABNORMAL LOW (ref >39.00)
LDL Cholesterol: 48 mg/dL (ref 0–99)
NonHDL: 78.28
Total CHOL/HDL Ratio: 3
Triglycerides: 153 mg/dL — ABNORMAL HIGH (ref 0.0–149.0)
VLDL: 30.6 mg/dL (ref 0.0–40.0)

## 2021-09-18 LAB — BASIC METABOLIC PANEL
BUN: 15 mg/dL (ref 6–23)
CO2: 29 mEq/L (ref 19–32)
Calcium: 9.3 mg/dL (ref 8.4–10.5)
Chloride: 104 mEq/L (ref 96–112)
Creatinine, Ser: 0.74 mg/dL (ref 0.40–1.50)
GFR: 92.64 mL/min (ref >60.00)
Glucose, Bld: 150 mg/dL — ABNORMAL HIGH (ref 70–99)
Potassium: 4.1 mEq/L (ref 3.5–5.1)
Sodium: 139 mEq/L (ref 135–145)

## 2021-09-18 LAB — MICROALBUMIN / CREATININE URINE RATIO
Creatinine,U: 64.7 mg/dL
Microalb Creat Ratio: 1.1 mg/g (ref 0.0–30.0)
Microalb, Ur: <0.7 mg/dL (ref 0.0–1.9)

## 2021-09-22 ENCOUNTER — Telehealth: Payer: Self-pay

## 2021-09-22 ENCOUNTER — Other Ambulatory Visit: Payer: Self-pay

## 2021-09-22 DIAGNOSIS — Z794 Long term (current) use of insulin: Secondary | ICD-10-CM

## 2021-09-22 DIAGNOSIS — E1165 Type 2 diabetes mellitus with hyperglycemia: Secondary | ICD-10-CM

## 2021-09-22 MED ORDER — HUMULIN R U-500 KWIKPEN 500 UNIT/ML ~~LOC~~ SOPN: PEN_INJECTOR | SUBCUTANEOUS | 5 refills | Status: DC

## 2021-09-22 NOTE — Telephone Encounter (Signed)
Patient left vm needing a refill on humulin R. Rx sent to pharmacy.

## 2021-10-24 ENCOUNTER — Other Ambulatory Visit: Payer: Self-pay

## 2021-10-24 DIAGNOSIS — E1165 Type 2 diabetes mellitus with hyperglycemia: Secondary | ICD-10-CM

## 2021-10-24 MED ORDER — TRESIBA FLEXTOUCH 200 UNIT/ML ~~LOC~~ SOPN: PEN_INJECTOR | SUBCUTANEOUS | 3 refills | Status: DC

## 2021-11-12 ENCOUNTER — Other Ambulatory Visit: Payer: Self-pay | Admitting: Endocrinology

## 2021-12-04 ENCOUNTER — Other Ambulatory Visit: Payer: Self-pay

## 2021-12-04 DIAGNOSIS — E1165 Type 2 diabetes mellitus with hyperglycemia: Secondary | ICD-10-CM

## 2021-12-04 MED ORDER — OZEMPIC (1 MG/DOSE) 4 MG/3ML ~~LOC~~ SOPN: 1.0000 mg | PEN_INJECTOR | SUBCUTANEOUS | 2 refills | Status: DC

## 2021-12-17 ENCOUNTER — Ambulatory Visit (INDEPENDENT_AMBULATORY_CARE_PROVIDER_SITE_OTHER): Payer: Medicare HMO | Admitting: Endocrinology

## 2021-12-17 ENCOUNTER — Encounter: Payer: Self-pay | Admitting: Endocrinology

## 2021-12-17 VITALS — BP 112/70 | HR 70 | Ht 68.0 in | Wt 220.0 lb

## 2021-12-17 DIAGNOSIS — Z794 Long term (current) use of insulin: Secondary | ICD-10-CM | POA: Diagnosis not present

## 2021-12-17 DIAGNOSIS — E1165 Type 2 diabetes mellitus with hyperglycemia: Secondary | ICD-10-CM | POA: Diagnosis not present

## 2021-12-17 DIAGNOSIS — I10 Essential (primary) hypertension: Secondary | ICD-10-CM

## 2021-12-17 LAB — POCT GLYCOSYLATED HEMOGLOBIN (HGB A1C): Hemoglobin A1C: 6.7 % — AB (ref 4.0–5.6)

## 2021-12-17 MED ORDER — OZEMPIC (2 MG/DOSE) 8 MG/3ML ~~LOC~~ SOPN: PEN_INJECTOR | SUBCUTANEOUS | 5 refills | Status: DC

## 2021-12-17 NOTE — Progress Notes (Signed)
Patient ID: [name] [name], male   DOB: 1951-06-19, 71 y.o.   MRN: [id number] ? ?       ? ? ?Reason for Appointment: Follow-up for Type 2 Diabetes ? ? ? ?History of Present Illness:  ?        ?Date of diagnosis of type 2 diabetes mellitus: 2011       ? ?Background history:  ?He was probably started on metformin at diagnosis and subsequently given Amaryl also ?Subsequently had been on Janumet and more recently this was changed to [employer]. ?Because of poor control he was given Levemir in addition to his Kombiglyze in 01/2013 ?The dose was progressively increased from initial dose of 12 units ?He did have somewhat better control right after starting insulin with A1c below 7% but only one time ?His blood sugars had been consistently high since 2014 with A1c at least 8% ? ?Recent history:  ? ?INSULIN regimen is described as: [place] 124 units daily.   ?Humulin R U-500, 45 units at breakfast, 0-50 at lunch, and 50-55  at dinner ? ?Non-insulin hypoglycemic drugs the patient is taking are: Metformin ER 2 g, OZEMPIC 1.0 mg weekly, Wilder Glade 3/7 ? ?A1c is improved 6.7 ? ? ?Current blood sugar patterns, management and problems identified: ?He has cut back some of his insulin on his own possibly because of relatively lower blood sugars  ?Has been on the same dose recently of the Humulin but has gone up to 8 units on the Tresiba from a low 116 units  ?Overall however he still benefiting from [place] with better control ?Periodically he still tends to have relatively high readings in the evenings after dinner with some readings over 200 ?His appetite has been decreased and he is eating smaller portions ?He does try to take his Humulin up to 30 minutes before eating ?He is concerned about his blood sugars rising in the morning even before breakfast ?He has done a little more walking but limited by hip pain ?Only has mild abdominal discomfort for a day after his Ozempic injection but no nausea ? ?Side effects from medications  have been: ?  Dizziness from [place] ? ?Interpretation of the download for the last 2 weeks is as follows ? ?Summary of blood sugar patterns ?Hyperglycemia is seen primarily after dinner/late evening to a variable extent  ?No hypoglycemia ?Overnight blood sugars are quite variable but on an average between 150-160 mostly and only once low normal  ?There is appears to have some dawn phenomenon at times  ?LOWEST blood sugars are before lunch  ?Postprandial readings after breakfast and lunch are relatively even compared to Premeal readings but not rising significantly about half the time  ? ? ?CGM use % of time 88  ?2-week average/GV 164/23  ?Time in range     72   %  ?% Time Above 180 26+2  ?% Time above 250   ?% Time Below 70 0  ? ?  ?PRE-MEAL Fasting Lunch Dinner Bedtime Overall  ?Glucose range:       ?Averages: 160 152 156    ? ?POST-MEAL PC Breakfast PC Lunch PC Dinner  ?Glucose range:     ?Averages: 172 160 203  ? ? ?Previously: ? ?CGM use % of time 83  ?2-week average/GV 154  ?Time in range      77  % was 41  ?% Time Above 180 23  ?% Time above 250   ?% Time Below 70   ? ?  ?  PRE-MEAL Fasting Lunch Dinner Bedtime Overall  ?Glucose range:       ?Averages: 132  154 194 154  ? ?POST-MEAL PC Breakfast PC Lunch PC Dinner  ?Glucose range:     ?Averages: 167  168  ? ? ?Self-care: The diet that the patient has been following is: tries to limit sweet drinks.   ?   ?Meal times: Breakfast: 9 AM Lunch: 1 PM Dinner: 6 PM   ?Typical meal intake: Breakfast can occasionally be a fast food biscuit, lunch is a sandwich or vegetables, variable meal at suppertime. ?               ?Dietician visit, most recent: 6/16 ? ?Weight history: Highest  previously 256 ? ?Wt Readings from Last 3 Encounters:  ?12/17/21 220 lb (99.8 kg)  ?09/17/21 220 lb 3.2 oz (99.9 kg)  ?07/09/21 226 lb 3.2 oz (102.6 kg)  ? ? ?Glycemic control: ?  ?Lab Results  ?Component Value Date  ? HGBA1C 6.7 (A) 12/17/2021  ? HGBA1C 7.5 (A) 07/09/2021  ? HGBA1C 7.0 (A)  04/08/2021  ? ?Lab Results  ?Component Value Date  ? MICROALBUR <0.7 09/17/2021  ? [place] 48 09/17/2021  ? CREATININE 0.74 09/17/2021  ? ? ?Other active problems: See review of systems ? ? ? ?Allergies as of 12/17/2021   ? ?   Reactions  ? Rosuvastatin Other (See Comments)  ? REACTION: myalgias  ? ?  ? ?  ?Medication List  ?  ? ?  ? Accurate as of Dec 17, 2021 10:47 AM. If you have any questions, ask your nurse or doctor.  ?  ?  ? ?  ? ?STOP taking these medications   ? ?Ozempic (1 MG/DOSE) 4 MG/3ML Sopn ?Generic drug: Semaglutide (1 MG/DOSE) ?Replaced by: Ozempic (2 MG/DOSE) 8 MG/3ML Sopn ?Stopped by: Elayne Snare, MD ?  ? ?  ? ?TAKE these medications   ? ?aspirin 81 MG tablet ?Take 81 mg by mouth daily. ?  ?atorvastatin 40 MG tablet ?Commonly known as: LIPITOR ?TAKE 1 TABLET BY MOUTH EVERY DAY ?  ?dapagliflozin propanediol 10 MG Tabs tablet ?Commonly known as: [place] ?Take 1 tablet (10 mg total) by mouth daily. ?  ?Dexcom G6 Receiver Kerrin Mo ?Use as direction to check sugar daily. ?  ?Dexcom G6 Sensor Misc ?Use to monitor blood sugar, change after 10 days ?  ?Dexcom G6 Transmitter Misc ?Change 1 Device every 90 days. ?  ?fenofibrate micronized 200 MG capsule ?Commonly known as: LOFIBRA ?TAKE 1 CAPSULE BY MOUTH EVERY DAY ?  ?FISH OIL PO ?Take by mouth. ?  ?glucose blood test strip ?USE TO TEST BLOOD SUGAR TWICE DAILY DX CODE E11.65 ?  ?OneTouch Verio test strip ?Generic drug: glucose blood ?USE AS DIRECTED TO TEST SUGAR ONCE DAILY ?  ?HumuLIN R U-500 KwikPen 500 UNIT/ML KwikPen ?Generic drug: insulin regular human CONCENTRATED ?INJECT 20 UNITS UNDER THE SKIN BEFORE BREAKFAST, 30 UNITS AT LUNCH, 35 UNITS AT DINNER, MAY CHANGE BY 5 UNITS BASED ON MEALS ?  ?hydrochlorothiazide 25 MG tablet ?Commonly known as: HYDRODIURIL ?TAKE 1 TABLET BY MOUTH EVERY DAY ?  ?metFORMIN 500 MG 24 hr tablet ?Commonly known as: GLUCOPHAGE-XR ?TAKE 4 TABLETS BY MOUTH DAILY WITH SUPPER ?  ?Novofine Pen Needle 32G X 6 MM Misc ?Generic drug:  Insulin Pen Needle ?USE TO INJECT INSULIN TWICE DAILY. ?  ?BD Pen Needle Micro U/F 32G X 6 MM Misc ?Generic drug: Insulin Pen Needle ?USE TO INJECT INSULIN TWICE DAILY ?  ?  OneTouch Delica Plus [id number] Misc ?USE ONCE DAILY TO TEST BLOOD SUGAR ?  ?Ozempic (2 MG/DOSE) 8 MG/3ML Sopn ?Generic drug: Semaglutide (2 MG/DOSE) ?Inject 2 mg weekly ?Replaces: Ozempic (1 MG/DOSE) 4 MG/3ML Sopn ?Started by: Elayne Snare, MD ?  ?ramipril 5 MG capsule ?Commonly known as: ALTACE ?TAKE 1 CAPSULE(5 MG) BY MOUTH TWICE DAILY ?  ?Tyler Aas FlexTouch 200 UNIT/ML FlexTouch Pen ?Generic drug: insulin degludec ?ADMINISTER 144 UNITS UNDER THE SKIN DAILY WITH SUPPER ?What changed: how much to take ?  ? ?  ? ? ?Allergies:  ?Allergies  ?Allergen Reactions  ? Rosuvastatin Other (See Comments)  ?  REACTION: myalgias  ? ? ?Past Medical History:  ?Diagnosis Date  ? Allergy   ? 1-2 x a year   ? Aortic valve sclerosis   ? echo june 2009  ? Arthritis   ? fingers, back   ? CAD (coronary artery disease)   ? a. 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 b. s/p CABG 04/2011.  ? Carotid artery disease ([place])   ? Doppler, preop, September, 2012,  mild bilateral plaque with no significant stenoses  ? Diabetes mellitus   ? Dizziness   ? June, 2013  ? Ejection fraction   ? a. Ejection fraction 60%, echo, June, 2009. b. EF preserved by cath 2012.  ? Food poisoning   ? age 40  ? GERD (gastroesophageal reflux disease)   ? past hx   ? Hx of CABG   ? Roxy Manns,  x4  May 01, 2011 limited to the LAD, left radial artery to the OM, SVG to diagonal, SVG to posterior descending  ? Hyperlipidemia   ? on meds   ? Hypertension   ? controlled   ? Muscle ache   ? on Crestor  09/04/09 CPK 193(7-232) tolerated simvastatin in the past  ? Neuromuscular disorder ([place])   ? nerves down right leg   ? PVC (premature ventricular contraction) 06/2009  ? November, 2010  ? Rash   ? ? ?Past Surgical History:  ?Procedure Laterality Date  ? BACK SURGERY    ?  coccyx bone removed    ? COLONOSCOPY    ? CORONARY ARTERY BYPASS GRAFT  05/01/2011  ? CABG x4 with LIMA to LAD, LRA to OM, SVG to D1, SVG to PDA, EVH via left thigh  ? Intraoperative transesophageal echocardiogra

## 2021-12-17 NOTE — Patient Instructions (Addendum)
Take upto 70 Humulin before supper for heavier meals ? ?Take am shot on waking up ?

## 2021-12-22 ENCOUNTER — Encounter: Payer: Self-pay | Admitting: Endocrinology

## 2022-02-07 ENCOUNTER — Other Ambulatory Visit: Payer: Self-pay | Admitting: Endocrinology

## 2022-02-11 ENCOUNTER — Telehealth: Payer: Self-pay

## 2022-02-11 NOTE — Telephone Encounter (Signed)
[name] [name] from [place] called to have last office notes faxed. I faxed notes through Epic.

## 2022-02-20 ENCOUNTER — Other Ambulatory Visit: Payer: Self-pay

## 2022-02-20 DIAGNOSIS — E1165 Type 2 diabetes mellitus with hyperglycemia: Secondary | ICD-10-CM

## 2022-02-20 MED ORDER — PEN NEEDLES 32G X 6 MM MISC: 3 refills | Status: DC

## 2022-03-04 ENCOUNTER — Other Ambulatory Visit: Payer: Self-pay | Admitting: Endocrinology

## 2022-03-11 ENCOUNTER — Telehealth: Payer: Self-pay

## 2022-03-11 DIAGNOSIS — E1165 Type 2 diabetes mellitus with hyperglycemia: Secondary | ICD-10-CM

## 2022-03-11 NOTE — Telephone Encounter (Signed)
Patient called in states that he is due for 2mg  ozempic today and none in his area. He is not close enough to office to come and get a sample. Please advise

## 2022-03-12 MED ORDER — SEMAGLUTIDE (1 MG/DOSE) 4 MG/3ML ~~LOC~~ SOPN: 1.0000 mg | PEN_INJECTOR | SUBCUTANEOUS | 0 refills | Status: DC

## 2022-03-12 NOTE — Addendum Note (Signed)
Addended by: Eliseo Squires on: 03/12/2022 10:45 AM   Modules accepted: Orders

## 2022-03-12 NOTE — Telephone Encounter (Signed)
Called and informed patient and he will get Rx from pharmacy.

## 2022-03-16 ENCOUNTER — Other Ambulatory Visit: Payer: Self-pay

## 2022-03-16 DIAGNOSIS — Z794 Long term (current) use of insulin: Secondary | ICD-10-CM

## 2022-03-16 MED ORDER — HUMULIN R U-500 KWIKPEN 500 UNIT/ML ~~LOC~~ SOPN: PEN_INJECTOR | SUBCUTANEOUS | 5 refills | Status: DC

## 2022-04-15 ENCOUNTER — Ambulatory Visit: Payer: Medicare HMO | Admitting: Endocrinology

## 2022-04-15 ENCOUNTER — Encounter: Payer: Self-pay | Admitting: Endocrinology

## 2022-04-15 VITALS — BP 120/62 | HR 59 | Ht 68.0 in | Wt 219.2 lb

## 2022-04-15 DIAGNOSIS — Z794 Long term (current) use of insulin: Secondary | ICD-10-CM

## 2022-04-15 DIAGNOSIS — E782 Mixed hyperlipidemia: Secondary | ICD-10-CM

## 2022-04-15 DIAGNOSIS — E1165 Type 2 diabetes mellitus with hyperglycemia: Secondary | ICD-10-CM | POA: Diagnosis not present

## 2022-04-15 LAB — COMPREHENSIVE METABOLIC PANEL
ALT: 25 U/L (ref 0–53)
AST: 20 U/L (ref 0–37)
Albumin: 4.4 g/dL (ref 3.5–5.2)
Alkaline Phosphatase: 36 U/L — ABNORMAL LOW (ref 39–117)
BUN: 12 mg/dL (ref 6–23)
CO2: 28 mEq/L (ref 19–32)
Calcium: 9.8 mg/dL (ref 8.4–10.5)
Chloride: 104 mEq/L (ref 96–112)
Creatinine, Ser: 0.66 mg/dL (ref 0.40–1.50)
GFR: 95.51 mL/min (ref >60.00)
Glucose, Bld: 117 mg/dL — ABNORMAL HIGH (ref 70–99)
Potassium: 3.9 mEq/L (ref 3.5–5.1)
Sodium: 140 mEq/L (ref 135–145)
Total Bilirubin: 0.7 mg/dL (ref 0.2–1.2)
Total Protein: 7.4 g/dL (ref 6.0–8.3)

## 2022-04-15 LAB — LIPID PANEL
Cholesterol: 112 mg/dL (ref 0–200)
HDL: 37.7 mg/dL — ABNORMAL LOW (ref >39.00)
NonHDL: 74.4
Total CHOL/HDL Ratio: 3
Triglycerides: 203 mg/dL — ABNORMAL HIGH (ref 0.0–149.0)
VLDL: 40.6 mg/dL — ABNORMAL HIGH (ref 0.0–40.0)

## 2022-04-15 LAB — POCT GLYCOSYLATED HEMOGLOBIN (HGB A1C): Hemoglobin A1C: 6.7 % — AB (ref 4.0–5.6)

## 2022-04-15 LAB — LDL CHOLESTEROL, DIRECT: Direct LDL: 56 mg/dL

## 2022-04-15 NOTE — Patient Instructions (Signed)
Ozempic 1/2 dose for now then 1mg  till done then 2mg   Humulin 55-65 before supper

## 2022-04-15 NOTE — Progress Notes (Signed)
Patient ID: [name] [name], male   DOB: 12/30/1950, 71 y.o.   MRN: [id number]           Reason for Appointment: Follow-up for Type 2 Diabetes    History of Present Illness:          Date of diagnosis of type 2 diabetes mellitus: 2011        Background history:  He was probably started on metformin at diagnosis and subsequently given Amaryl also Subsequently had been on Janumet and more recently this was changed to [facility]. Because of poor control he was given Levemir in addition to his Kombiglyze in 01/2013 The dose was progressively increased from initial dose of 12 units He did have somewhat better control right after starting insulin with A1c below 7% but only one time His blood sugars had been consistently high since 2014 with A1c at least 8%  Recent history:   INSULIN regimen is described as: [place] 122 units daily.   Humulin R U-500, 45 units at breakfast,30-40 at lunch, and 50-55  at dinner  Non-insulin hypoglycemic drugs the patient is taking are: Metformin ER 2 g, OZEMPIC 1.0 mg weekly, Farxiga 3/7  A1c is 6.7   Current blood sugar patterns, management and problems identified: He has had difficulty getting his Ozempic consistently but generally taking 1 mg or recently has not started back on the dose because of an episode of significant nausea and some vomiting after a delay dose His blood sugars are overall relatively higher with only 62% in target range compared to 72 Again most of his hyperglycemia is postprandial He feels that his blood sugars were better when he was taking 2 mg Ozempic which he was tolerating His weight is about the same He is still not able to walk as much as he needs to because of knee pain  Side effects from medications have been: ?  Dizziness from [facility]  Interpretation of the download for the last 2 weeks is as follows  Summary of blood sugar patterns Hyperglycemia is seen during the daytime except early morning with also some  tendency to hyperglycemia inconsistently throughout the day but overall highest blood sugars are after about 6 PM Before 6 PM blood sugars are on an average within the target range  Overnight blood sugars are somewhat variable and averaging in the 140-170 range Also blood sugars are trending higher late afternoon with inconsistent patterns from day-to-day Blood sugars appear to be higher in the last 3 days overall  On an average postprandial readings are rising more after dinner and periodically after lunch also  No hypoglycemia Recent GMI 7.34 to bottom  CGM use % of time   2-week average/GV 166  Time in range   62     %  % Time Above 180 36  % Time above 250 2  % Time Below 70      PRE-MEAL Fasting Lunch Dinner Bedtime Overall  Glucose range:       Averages: 157       POST-MEAL PC Breakfast PC Lunch PC Dinner  Glucose range:     Averages: 162  174  193   Prior   CGM use % of time 88  2-week average/GV 164/23  Time in range     72   %  % Time Above 180 26+2  % Time above 250   % Time Below 70 0     PRE-MEAL Fasting Lunch Dinner Bedtime Overall  Glucose range:  Averages: 160 152 156     POST-MEAL PC Breakfast PC Lunch PC Dinner  Glucose range:     Averages: 172 160 203     Self-care: The diet that the patient has been following is: tries to limit sweet drinks.      Meal times: Breakfast: 9 AM Lunch: 1 PM Dinner: 6 PM   Typical meal intake: Breakfast can occasionally be a fast food biscuit, lunch is a sandwich or vegetables, variable meal at suppertime.                Dietician visit, most recent: 6/16  Weight history: Highest  previously 256  Wt Readings from Last 3 Encounters:  04/15/22 219 lb 3.2 oz (99.4 kg)  12/17/21 220 lb (99.8 kg)  09/17/21 220 lb 3.2 oz (99.9 kg)    Glycemic control:   Lab Results  Component Value Date   HGBA1C 6.7 (A) 04/15/2022   HGBA1C 6.7 (A) 12/17/2021   HGBA1C 7.5 (A) 07/09/2021   Lab Results  Component Value  Date   MICROALBUR <0.7 09/17/2021   LDLCALC 48 09/17/2021   CREATININE 0.74 09/17/2021    Other active problems: See review of systems    Allergies as of 04/15/2022       Reactions   Rosuvastatin Other (See Comments)   REACTION: myalgias        Medication List        Accurate as of April 15, 2022  3:43 PM. If you have any questions, ask your nurse or doctor.          aspirin 81 MG tablet Take 81 mg by mouth daily.   atorvastatin 40 MG tablet Commonly known as: LIPITOR TAKE 1 TABLET BY MOUTH EVERY DAY   Dexcom G6 Receiver Devi Use as direction to check sugar daily.   Dexcom G6 Sensor Misc Use to monitor blood sugar, change after 10 days   Dexcom G6 Transmitter Misc Change 1 Device every 90 days.   Farxiga 10 MG Tabs tablet Generic drug: dapagliflozin propanediol TAKE 1 TABLET(10 MG) BY MOUTH DAILY   fenofibrate micronized 200 MG capsule Commonly known as: LOFIBRA TAKE 1 CAPSULE BY MOUTH EVERY DAY   FISH OIL PO Take by mouth.   glucose blood test strip USE TO TEST BLOOD SUGAR TWICE DAILY DX CODE E11.65   OneTouch Verio test strip Generic drug: glucose blood USE AS DIRECTED TO TEST SUGAR ONCE DAILY   HumuLIN R U-500 KwikPen 500 UNIT/ML KwikPen Generic drug: insulin regular human CONCENTRATED INJECT 20 UNITS UNDER THE SKIN BEFORE BREAKFAST, 30 UNITS AT LUNCH, 35 UNITS AT DINNER, MAY CHANGE BY 5 UNITS BASED ON MEALS   hydrochlorothiazide 25 MG tablet Commonly known as: HYDRODIURIL TAKE 1 TABLET BY MOUTH EVERY DAY   metFORMIN 500 MG 24 hr tablet Commonly known as: GLUCOPHAGE-XR TAKE 4 TABLETS BY MOUTH DAILY WITH SUPPER   Novofine Pen Needle 32G X 6 MM Misc Generic drug: Insulin Pen Needle USE TO INJECT INSULIN TWICE DAILY.   BD Pen Needle Micro U/F 32G X 6 MM Misc Generic drug: Insulin Pen Needle USE TO INJECT INSULIN TWICE DAILY   Pen Needles 32G X 6 MM Misc USE TO INJECT INSULIN TWICE DAILY   OneTouch Delica Plus Lancet30G Misc USE  ONCE DAILY TO TEST BLOOD SUGAR   Ozempic (2 MG/DOSE) 8 MG/3ML Sopn Generic drug: Semaglutide (2 MG/DOSE) Inject 2 mg weekly   Semaglutide (1 MG/DOSE) 4 MG/3ML Sopn Inject 1 mg as directed once a  week.   ramipril 5 MG capsule Commonly known as: ALTACE TAKE 1 CAPSULE(5 MG) BY MOUTH TWICE DAILY   Tresiba FlexTouch 200 UNIT/ML FlexTouch Pen Generic drug: insulin degludec ADMINISTER 144 UNITS UNDER THE SKIN DAILY WITH SUPPER What changed: how much to take        Allergies:  Allergies  Allergen Reactions   Rosuvastatin Other (See Comments)    REACTION: myalgias    Past Medical History:  Diagnosis Date   Allergy    1-2 x a year    Aortic valve sclerosis    echo june 2009   Arthritis    fingers, back    CAD (coronary artery disease)    a. 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 b. s/p CABG 04/2011.   Carotid artery disease (HCC)    Doppler, preop, September, 2012,  mild bilateral plaque with no significant stenoses   Diabetes mellitus    Dizziness    June, 2013   Ejection fraction    a. Ejection fraction 60%, echo, June, 2009. b. EF preserved by cath 2012.   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  05/01/2011   pilonidal cystectomy     POLYPECTOMY     TIBIA FRACTURE SURGERY      Family History  Problem Relation Age of Onset   Heart attack Paternal Grandfather        late 73s   Hearing loss Paternal Grandfather    Hypertension Father    Heart attack Father 30       bypass   Diabetes Father    Hearing loss Father    Thyroid disease Father    Cancer Maternal Grandfather        site unknown   Prostate cancer Maternal Grandfather    Diabetes Paternal Aunt        X 3   Heart failure Maternal Grandmother    Diabetes Mother    Diabetes Paternal Uncle    Stroke Neg Hx    Colon cancer Neg Hx    Colon polyps Neg Hx    Esophageal cancer Neg Hx    Rectal cancer Neg Hx    Stomach cancer Neg Hx     Social History:  reports that he has never smoked. He has never used smokeless tobacco. He reports that he does not drink alcohol and does not use drugs.    Review of Systems   Lipid history: He has been taking fish oil and  fenofibrate for baseline high triglycerides along with generic Lipitor 40 mg  Triglycerides have been variably high; his insurance will not cover Vascepa or Lovaza Currently on OTC omega-3 fish oil 2/day    Lab Results  Component Value Date   CHOL 111 09/17/2021   HDL 33.20 (L) 09/17/2021   LDLCALC 48 09/17/2021   LDLDIRECT 55.0 04/08/2021   TRIG 153.0 (H) 09/17/2021   CHOLHDL 3 09/17/2021            THYROID: He had a 2.4 cm dominant left-sided thyroid nodule on Ultrasound in 12/2014  Follow-up showed the same size of the nodules  TSH was 1.6 done by PCP in 06/2019   Lab Results  Component Value Date   TSH 2.11 04/28/2017     HYPERTENSION:  on  ramipril 5 mg and also HCTZ 25 mg from his cardiologist with generally good control   BP Readings from Last 3 Encounters:  04/15/22 120/62  12/17/21 112/70  09/17/21 (!) 142/82   Dr Hazle Quant  LABS:  Office Visit on 04/15/2022  Component Date Value Ref Range Status   Hemoglobin  A1C 04/15/2022 6.7 (A)  4.0 - 5.6 % Final    Physical Examination:  BP 120/62   Pulse (!) 59   Ht 5\' 8"  (1.727 m)   Wt 219 lb 3.2 oz (99.4 kg)   SpO2 98%   BMI 33.33 kg/m   Diabetic Foot Exam - Simple   Simple Foot Form Diabetic Foot exam was performed with the following findings: Yes   Visual Inspection No deformities, no ulcerations, no other skin breakdown bilaterally: Yes Sensation Testing Intact to touch and monofilament testing bilaterally: Yes Pulse Check Posterior Tibialis and Dorsalis pulse intact bilaterally: Yes See comments: Yes Comments Right posterior tibialis not felt Right dorsalis pedis diminished, 2/4    Thyroid nodule palpable on the left side about 2.5-3 cm and relatively indistinct   ASSESSMENT:  Diabetes type 2 on insulin     See history of present illness for detailed discussion of  current management, blood sugar patterns and problems identified  His A1c is again better at 6.7  His day-to-day management and blood sugar patterns from freestyle libre were discussed in detail  He is on large doses of insulin especially Tresiba and also mealtime U-500 insulin 3 times a day, 1 mg Ozempic and metformin  Recently his blood sugars are averaging about 166 but A1c has not gone up He thinks blood sugars are higher from not getting his Ozempic 2 mg dose especially recently His freestyle libre patterns indicate higher readings after meals primarily Also can benefit from some weight loss  LIPIDS: Needs follow-up  HYPERTENSION: Well controlled  Thyroid nodule: Appears stable clinically  PLAN:  He was advised to do a trial of only 0.5 mg Ozempic using his current 1 mg pen Also likely needs to go up 10 to 15 units on the suppertime dose of the Humulin R temporarily while his blood sugars are being controlled with the Ozempic titration Reminded him to take the Humulin R before eating at least 20 to 30 minutes He does need to be walking more  consistently Will go up to OZEMPIC 2 mg on his next refill Follow-up labs today Discussed blood sugar targets after meals and to adjust mealtime dose based on portions and carbohydrate intake   Total visit time including counseling = 30 minutes  Patient Instructions  Ozempic 1/2 dose for now then 1mg  till done then 2mg   Humulin 55-65  before supper      Elayne Snare 04/15/2022, 3:43 PM   Note: This office note was prepared with Dragon voice recognition system technology. Any transcriptional errors that result from this process are unintentional.

## 2022-04-17 ENCOUNTER — Encounter: Payer: Self-pay | Admitting: Endocrinology

## 2022-05-09 ENCOUNTER — Other Ambulatory Visit: Payer: Self-pay | Admitting: Endocrinology

## 2022-05-09 ENCOUNTER — Other Ambulatory Visit: Payer: Self-pay | Admitting: Cardiology

## 2022-05-11 ENCOUNTER — Other Ambulatory Visit: Payer: Self-pay

## 2022-05-11 ENCOUNTER — Other Ambulatory Visit: Payer: Self-pay | Admitting: *Deleted

## 2022-05-11 DIAGNOSIS — E1165 Type 2 diabetes mellitus with hyperglycemia: Secondary | ICD-10-CM

## 2022-05-11 MED ORDER — OZEMPIC (2 MG/DOSE) 8 MG/3ML ~~LOC~~ SOPN: PEN_INJECTOR | SUBCUTANEOUS | 5 refills | Status: DC

## 2022-05-27 ENCOUNTER — Other Ambulatory Visit: Payer: Self-pay | Admitting: Cardiology

## 2022-06-03 ENCOUNTER — Other Ambulatory Visit: Payer: Self-pay | Admitting: Cardiology

## 2022-06-17 ENCOUNTER — Other Ambulatory Visit: Payer: Self-pay | Admitting: Cardiology

## 2022-07-09 ENCOUNTER — Other Ambulatory Visit: Payer: Self-pay | Admitting: Cardiology

## 2022-07-09 NOTE — Telephone Encounter (Signed)
Rx refill sent to pharmacy. 

## 2022-07-13 ENCOUNTER — Other Ambulatory Visit: Payer: Self-pay

## 2022-07-13 DIAGNOSIS — E1165 Type 2 diabetes mellitus with hyperglycemia: Secondary | ICD-10-CM

## 2022-07-13 MED ORDER — METFORMIN HCL ER 500 MG PO TB24: ORAL_TABLET | ORAL | 3 refills | Status: DC

## 2022-07-21 ENCOUNTER — Other Ambulatory Visit: Payer: Self-pay | Admitting: Endocrinology

## 2022-07-21 DIAGNOSIS — E1165 Type 2 diabetes mellitus with hyperglycemia: Secondary | ICD-10-CM

## 2022-08-02 ENCOUNTER — Other Ambulatory Visit: Payer: Self-pay | Admitting: Cardiology

## 2022-08-05 ENCOUNTER — Ambulatory Visit (INDEPENDENT_AMBULATORY_CARE_PROVIDER_SITE_OTHER): Payer: Medicare HMO

## 2022-08-05 ENCOUNTER — Ambulatory Visit (INDEPENDENT_AMBULATORY_CARE_PROVIDER_SITE_OTHER): Payer: Medicare HMO | Admitting: Orthopedic Surgery

## 2022-08-05 ENCOUNTER — Encounter: Payer: Self-pay | Admitting: Orthopedic Surgery

## 2022-08-05 DIAGNOSIS — M79672 Pain in left foot: Secondary | ICD-10-CM

## 2022-08-05 DIAGNOSIS — M79644 Pain in right finger(s): Secondary | ICD-10-CM | POA: Diagnosis not present

## 2022-08-05 NOTE — Progress Notes (Signed)
Office Visit Note   Patient: [name] [name]           Date of Birth: 22-Jul-1951           MRN: [id number] Visit Date: 08/05/2022 Requested by: Shon Baton, [address],  [place] [id number] PCP: Shon Baton, MD  Subjective: Chief Complaint  Patient presents with   Left Foot - Pain   Finger Injury    Injured while fishing summer 2023    HPI: [name] EVETTS is a 72 y.o. male who presents to the office reporting right hand middle finger pain as well as left foot lateral pain at the base of the fifth metatarsal.  He injured his right hand small finger distal joint this summer while fishing.  Pain comes and goes but overall is not something that he cannot live with.  He is right-hand dominant.  He is able to play golf with his right hand which is his main recreational activity.  Patient also describes left foot lateral sided pain.  Is like he may have a cyst inside the foot that is causing some very infrequent pain.  Denies a history of injury to the left foot..                ROS: All systems reviewed are negative as they relate to the chief complaint within the history of present illness.  Patient denies fevers or chills.  Assessment & Plan: Visit Diagnoses:  1. Pain in right finger(s)   2. 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  Currently it is not really giving him a functional deficit in the finger.  Due to the chronicity of the injury and the presence of arthritis I do not think splinting at this time would necessarily be helpful.  We are going to observe this for now.  The left foot has some peroneal tendinitis and a small spur which does not require intervention.  This is examined with ultrasound.  We will see him back as needed.  Follow-Up Instructions: No follow-ups on file.   Orders:  Orders Placed This Encounter  Procedures   XR Foot Complete Left   XR Finger Middle Right   No orders  of the defined types were placed in this encounter.     Procedures: No procedures performed   Clinical Data: No additional findings.  Objective: Vital Signs: There were no vitals taken for this visit.  Physical Exam:  Constitutional: Patient appears well-developed HEENT:  Head: Normocephalic Eyes:EOM are normal Neck: Normal range of motion Cardiovascular: Normal rate Pulmonary/chest: Effort normal Neurologic: Patient is alert Skin: Skin is warm Psychiatric: Patient has normal mood and affect  Ortho Exam: Ortho exam demonstrates mallet finger on the right middle finger.  Does not have swan-neck deformity currently.  No active extension against resistance but he does have passive range of motion of the joint of 10-45.  No significant tenderness to palpation around the DIP joint of the middle finger.  Left foot is examined.  He has palpable intact nontender anterior to posterior to peroneal and Achilles tendons with normal arch and no significant heel cord tightening.  Mild tenderness only over that base the fifth metatarsal.  Ultrasound examination demonstrates small spur present but the peroneal tendon is intact.  Radiographs also of the left foot negative for any Jones fracture.  Specialty Comments:  No specialty comments available.  Imaging: XR  Foot Complete Left  Result Date: 08/05/2022 AP lateral oblique radiographs left foot reviewed.  No acute fracture.  Tarsometatarsal alignment intact.  No significant arthritis noted in the midfoot or phalangeal region.  XR Finger Middle Right  Result Date: 08/05/2022 AP lateral radiographs right long finger reviewed.  Flexion deformity is present at the DIP joint with arthritis noted.  This is consistent with known clinical history of mallet finger.    PMFS History: Patient Active Problem List   Diagnosis Date Noted   Diabetes mellitus with coincident hypertension ([place]) 06/13/2021   Pure hypercholesterolemia 06/13/2021   Chest pain  of uncertain etiology 84/16/6063   Dizziness    Carotid artery disease (HCC)    Hx of CABG    CAD (coronary artery disease)    Hyperlipidemia    Aortic valve sclerosis    GERD (gastroesophageal reflux disease)    Muscle ache    Ejection fraction    KNEE PAIN, LEFT, CHRONIC 05/21/2010   PVC (premature ventricular contraction) 06/03/2009   Carotid bruit 06/03/2009   Type II diabetes mellitus with peripheral circulatory disorder, uncontrolled 03/27/2008   Essential hypertension 03/27/2008   Past Medical History:  Diagnosis Date   Allergy    1-2 x a year    Aortic valve sclerosis    echo june 2009   Arthritis    fingers, back    CAD (coronary artery disease)    a. 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 b. s/p CABG 04/2011.   Carotid artery disease (HCC)    Doppler, preop, September, 2012,  mild bilateral plaque with no significant stenoses   Diabetes mellitus    Dizziness    June, 2013   Ejection fraction    a. Ejection fraction 60%, echo, June, 2009. b. EF preserved by cath 2012.   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    PVC (premature ventricular contraction) 06/2009   November, 2010   Rash     Family History  Problem Relation Age of Onset   Heart attack Paternal Grandfather        late 16s   Hearing loss Paternal Grandfather    Hypertension Father    Heart attack Father 20       bypass   Diabetes Father    Hearing loss Father    Thyroid disease Father    Cancer Maternal Grandfather        site unknown   Prostate cancer Maternal Grandfather     Diabetes Paternal Aunt        X 3   Heart failure Maternal Grandmother    Diabetes Mother    Diabetes Paternal Uncle    Stroke Neg Hx    Colon cancer Neg Hx    Colon polyps Neg Hx    Esophageal cancer Neg Hx    Rectal cancer Neg Hx    Stomach cancer Neg Hx     Past Surgical  History:  Procedure Laterality Date   BACK SURGERY     coccyx bone removed     COLONOSCOPY     CORONARY ARTERY BYPASS GRAFT  05/01/2011   CABG x4 with LIMA to LAD, LRA to OM, SVG to D1, SVG to PDA, EVH via left thigh   Intraoperative transesophageal echocardiography.  05/01/2011   pilonidal cystectomy     POLYPECTOMY     TIBIA FRACTURE SURGERY     Social History   Occupational History   Occupation: Emergency planning/management officer   Tobacco Use   Smoking status: Never   Smokeless tobacco: Never  Substance and Sexual Activity   Alcohol use: No   Drug use: No   Sexual activity: Not on file

## 2022-08-06 ENCOUNTER — Other Ambulatory Visit: Payer: Self-pay | Admitting: Cardiology

## 2022-08-10 ENCOUNTER — Other Ambulatory Visit: Payer: Self-pay

## 2022-08-10 DIAGNOSIS — E1165 Type 2 diabetes mellitus with hyperglycemia: Secondary | ICD-10-CM

## 2022-08-10 MED ORDER — TRESIBA FLEXTOUCH 200 UNIT/ML ~~LOC~~ SOPN: PEN_INJECTOR | SUBCUTANEOUS | 3 refills | Status: DC

## 2022-08-18 ENCOUNTER — Other Ambulatory Visit: Payer: Self-pay | Admitting: Cardiology

## 2022-08-21 ENCOUNTER — Telehealth: Payer: Self-pay

## 2022-08-21 ENCOUNTER — Encounter: Payer: Self-pay | Admitting: Endocrinology

## 2022-08-21 ENCOUNTER — Ambulatory Visit: Payer: Medicare HMO | Admitting: Endocrinology

## 2022-08-21 VITALS — BP 118/68 | HR 75 | Ht 68.0 in | Wt 219.8 lb

## 2022-08-21 DIAGNOSIS — I10 Essential (primary) hypertension: Secondary | ICD-10-CM | POA: Diagnosis not present

## 2022-08-21 DIAGNOSIS — Z794 Long term (current) use of insulin: Secondary | ICD-10-CM

## 2022-08-21 DIAGNOSIS — E782 Mixed hyperlipidemia: Secondary | ICD-10-CM

## 2022-08-21 DIAGNOSIS — E1165 Type 2 diabetes mellitus with hyperglycemia: Secondary | ICD-10-CM

## 2022-08-21 LAB — POCT GLYCOSYLATED HEMOGLOBIN (HGB A1C): Hemoglobin A1C: 6.8 % — AB (ref 4.0–5.6)

## 2022-08-21 MED ORDER — OZEMPIC (2 MG/DOSE) 8 MG/3ML ~~LOC~~ SOPN: PEN_INJECTOR | SUBCUTANEOUS | 5 refills | Status: DC

## 2022-08-21 NOTE — Progress Notes (Signed)
Patient ID: [name] [name], male   DOB: 1950/09/14, 72 y.o.   MRN: [id number]           Reason for Appointment: Follow-up for Type 2 Diabetes    History of Present Illness:          Date of diagnosis of type 2 diabetes mellitus: 2011        Background history:  He was probably started on metformin at diagnosis and subsequently given Amaryl also Subsequently had been on Janumet and more recently this was changed to [facility]. Because of poor control he was given Levemir in addition to his Kombiglyze in 01/2013 The dose was progressively increased from initial dose of 12 units He did have somewhat better control right after starting insulin with A1c below 7% but only one time His blood sugars had been consistently high since 2014 with A1c at least 8%  Recent history:   INSULIN regimen is described as: [name] 124 units daily.   Humulin R U-500, 45 units at breakfast,30-40 at lunch, and 45  at dinner  Non-insulin hypoglycemic drugs the patient is taking are: Metformin ER 2 g, OZEMPIC 1 or 2 mg weekly, Farxiga 3/7  A1c is 6.8   Current blood sugar patterns, management and problems identified: He has had difficulty getting his Ozempic again and only recently is taking 1 mg with a sample from his PCP He has difficulty finding the refill at his local pharmacy If he is not taking Ozempic he ends up taking 20 units more of Tresiba However he is not getting enough insulin to cover his evening meal and is only taking 45 units instead of the 55 before Again he is likely adjusting his Premeal dose based on blood sugar levels and not what he is planning to eat His main meal is still in the evening Recently has not done much walking However weight is about the same Also taking the Humulin R U-500 insulin either right before or sometimes sometime AFTER eating dinner  Side effects from medications have been: ?  Dizziness from Invokana  Interpretation of the freestyle libre version 2  download for the last 2 weeks is as follows  Summary of blood sugar patterns Hyperglycemia is seen mostly in the late evenings after about 7 PM Lowest readings are about 2-3 PM OVERNIGHT blood sugars are starting of averaging 190 and decreasing to the lowest point around 5 AM before rising again and no hypoglycemia seen except rarely at 2 AM POSTPRANDIAL readings after lunch around 12-2 PM are the lowest of the day without hypoglycemia Blood sugars start increasing after about 2 PM and progressively going up to 10 PM Hyperglycemic episode after dinner are variable with a few significant spikes but otherwise generally gradually increasing   CGM use % of time 87  2-week average/GV 148/26  Time in range      80  % was 63  % Time Above 180 19  % Time above 250 1  % Time Below 70      PRE-MEAL Fasting Lunch Dinner Bedtime Overall  Glucose range:       Averages: 137   198 148   POST-MEAL PC Breakfast PC Lunch PC Dinner  Glucose range:     Averages:  115 159   Previously:   CGM use % of time   2-week average/GV 166  Time in range   62     %  % Time Above 180 36  % Time above 250  2  % Time Below 70      PRE-MEAL Fasting Lunch Dinner Bedtime Overall  Glucose range:       Averages: 157       POST-MEAL PC Breakfast PC Lunch PC Dinner  Glucose range:     Averages: 162  174  193   Self-care: The diet that the patient has been following is: tries to limit sweet drinks.      Meal times: Breakfast: 9 AM Lunch: 1 PM Dinner: 6 PM   Typical meal intake: Breakfast can occasionally be a fast food biscuit, lunch is a sandwich or vegetables, variable meal at suppertime.                Dietician visit, most recent: 6/16  Weight history: Highest  previously 256  Wt Readings from Last 3 Encounters:  08/21/22 219 lb 12.8 oz (99.7 kg)  04/15/22 219 lb 3.2 oz (99.4 kg)  12/17/21 220 lb (99.8 kg)    Glycemic control:   Lab Results  Component Value Date   HGBA1C 6.8 (A) 08/21/2022    HGBA1C 6.7 (A) 04/15/2022   HGBA1C 6.7 (A) 12/17/2021   Lab Results  Component Value Date   MICROALBUR <0.7 09/17/2021   [place] 48 09/17/2021   CREATININE 0.66 04/15/2022    Other active problems: See review of systems    Allergies as of 08/21/2022       Reactions   Rosuvastatin Other (See Comments)   REACTION: myalgias        Medication List        Accurate as of August 21, 2022 11:16 AM. If you have any questions, ask your nurse or doctor.          aspirin 81 MG tablet Take 81 mg by mouth daily.   atorvastatin 40 MG tablet Commonly known as: LIPITOR TAKE 1 TABLET BY MOUTH EVERY DAY   Dexcom G6 Receiver Devi Use as direction to check sugar daily.   Dexcom G6 Sensor Misc Use to monitor blood sugar, change after 10 days   Dexcom G6 Transmitter Misc Change 1 Device every 90 days.   Farxiga 10 MG Tabs tablet Generic drug: dapagliflozin propanediol TAKE 1 TABLET(10 MG) BY MOUTH DAILY   fenofibrate micronized 200 MG capsule Commonly known as: LOFIBRA TAKE 1 CAPSULE BY MOUTH EVERY DAY   FISH OIL PO Take by mouth.   glucose blood test strip USE TO TEST BLOOD SUGAR TWICE DAILY DX CODE E11.65   OneTouch Verio test strip Generic drug: glucose blood USE AS DIRECTED TO TEST SUGAR ONCE DAILY   HumuLIN R U-500 KwikPen 500 UNIT/ML KwikPen Generic drug: insulin regular human CONCENTRATED INJECT 20 UNITS UNDER THE SKIN BEFORE BREAKFAST, 30 UNITS AT LUNCH, 35 UNITS AT DINNER. MAY CHANGE BY 5 UNITS BASED ON MEALS.   hydrochlorothiazide 25 MG tablet Commonly known as: HYDRODIURIL Take 1 tablet (25 mg total) by mouth daily.   metFORMIN 500 MG 24 hr tablet Commonly known as: GLUCOPHAGE-XR TAKE 4 TABLETS BY MOUTH DAILY WITH SUPPER   Novofine Pen Needle 32G X 6 MM Misc Generic drug: Insulin Pen Needle USE TO INJECT INSULIN TWICE DAILY.   BD Pen Needle Micro U/F 32G X 6 MM Misc Generic drug: Insulin Pen Needle USE TO INJECT INSULIN TWICE DAILY   Pen  Needles 32G X 6 MM Misc USE TO INJECT INSULIN TWICE DAILY   OneTouch Delica Plus [id number] Misc USE ONCE DAILY TO TEST BLOOD SUGAR   ramipril 5 MG  capsule Commonly known as: ALTACE TAKE 1 CAPSULE(5 MG) BY MOUTH TWICE DAILY   Semaglutide (1 MG/DOSE) 4 MG/3ML Sopn Inject 1 mg as directed once a week.   Ozempic (2 MG/DOSE) 8 MG/3ML Sopn Generic drug: Semaglutide (2 MG/DOSE) Inject 2 mg weekly   Tresiba FlexTouch 200 UNIT/ML FlexTouch Pen Generic drug: insulin degludec ADMINISTER 144 UNITS UNDER THE SKIN DAILY WITH SUPPER        Allergies:  Allergies  Allergen Reactions   Rosuvastatin Other (See Comments)    REACTION: myalgias    Past Medical History:  Diagnosis Date   Allergy    1-2 x a year    Aortic valve sclerosis    echo june 2009   Arthritis    fingers, back    CAD (coronary artery disease)    a. 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 b. s/p CABG 04/2011.   Carotid artery disease (HCC)    Doppler, preop, September, 2012,  mild bilateral plaque with no significant stenoses   Diabetes mellitus    Dizziness    June, 2013   Ejection fraction    a. Ejection fraction 60%, echo, June, 2009. b. EF preserved by cath 2012.   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  05/01/2011   pilonidal cystectomy     POLYPECTOMY     TIBIA FRACTURE SURGERY      Family History  Problem Relation Age of Onset   Heart attack Paternal Grandfather        late 8s   Hearing loss Paternal Grandfather    Hypertension Father    Heart attack Father 45       bypass   Diabetes Father    Hearing loss Father    Thyroid disease Father    Cancer Maternal Grandfather        site unknown   Prostate cancer Maternal Grandfather    Diabetes Paternal Aunt        X 3   Heart failure Maternal Grandmother    Diabetes Mother    Diabetes Paternal Uncle    Stroke Neg Hx    Colon cancer Neg Hx    Colon polyps Neg Hx    Esophageal cancer Neg Hx    Rectal cancer Neg Hx    Stomach cancer Neg Hx     Social History:  reports that he has never smoked. He has never used smokeless tobacco. He reports that he does not drink alcohol and  does not use drugs.    Review of Systems   Lipid history: He has been taking fish oil and fenofibrate for baseline high triglycerides along with generic Lipitor 40 mg  Triglycerides have been variably high; his insurance will not cover Vascepa or Lovaza Currently on OTC omega-3 fish oil 2/day    Lab Results  Component Value Date   CHOL 112 04/15/2022   HDL 37.70 (L) 04/15/2022   LDLCALC 48 09/17/2021   LDLDIRECT 56.0 04/15/2022   TRIG 203.0 (H) 04/15/2022   CHOLHDL 3 04/15/2022            THYROID: He had a 2.4 cm dominant left-sided thyroid nodule on Ultrasound in 12/2014  Follow-up showed the same size of the nodules  TSH was 1.8 done by PCP in 12/23  Lab Results  Component Value Date   TSH 2.11 04/28/2017    HYPERTENSION:  on  ramipril 5 mg and also HCTZ 25 mg from his cardiologist with consistently good control   BP Readings from Last 3 Encounters:  08/21/22 118/68  04/15/22 120/62  12/17/21 112/70   Eye exams with. Dr Hazle Quant  LABS:  Office  Visit on 08/21/2022  Component Date Value Ref Range Status   Hemoglobin A1C 08/21/2022 6.8 (A)  4.0 - 5.6 % Final    Physical Examination:  BP 118/68   Pulse 75   Ht 5\' 8"  (1.727 m)   Wt 219 lb 12.8 oz (99.7 kg)   SpO2 95%   BMI 33.42 kg/m      ASSESSMENT:  Diabetes type 2 on insulin     See history of present illness for detailed discussion of  current management, blood sugar patterns and problems identified  His A1c is again better at 6.7  His day-to-day management and blood sugar patterns from freestyle libre were discussed in detail  He is on large doses of insulin especially Tresiba and also mealtime U-500 insulin 3 times a day, inconsistent doses of Ozempic and metformin  Recently his blood sugars are better and may be improving from going back on the Ozempic which she has not had consistently again Is not adjusting his Humulin R at dinnertime adequately to target his readings before bedtime and blood sugars are nearly 200 tablets at bedtime Still not able to lose weight and is not exercising Currently lowest readings are in the afternoon and may be able to reduce morning insulin dose somewhat   HYPERTENSION: Well controlled   PLAN:  He will try to get the Ozempic from different sources including his mail order company and see if he can be consistent with this Otherwise may consider switching to [facility] He does need to increase his dose of Humulin R at suppertime by at least 10 units to keep his blood sugars at least under 180 after dinner and before bedtime At the same time he will reduce his Tresiba at least to 120 to avoid overnight low sugars Discussed importance of taking HUMULIN R before eating and not after Again needs to base his mealtime dose based on portions and carbohydrate intake not on the Premeal glucose Restart regular walking .[facility] To get reports of his labs from his PCP done last month  Total visit time including counseling = 30  minutes  Patient Instructions  55 units at least 30 min before supper  Tresiba 120       09-06-1975 08/21/2022, 11:16 AM   Note: This office note was prepared with Dragon voice recognition system technology.  Any transcriptional errors that result from this process are unintentional.

## 2022-08-21 NOTE — Patient Instructions (Addendum)
55 units at least 30 min before supper  Tresiba 120   Exercise at [employer]

## 2022-08-21 NOTE — Telephone Encounter (Signed)
Attempted to contact PCP's office to obtain lab results and received no answer. LVM on nurse's phone line.   Shon Baton, MD      PCP - General, Internal Medicine       [phone]

## 2022-08-26 ENCOUNTER — Other Ambulatory Visit: Payer: Self-pay | Admitting: Cardiology

## 2022-08-27 ENCOUNTER — Encounter: Payer: Self-pay | Admitting: Endocrinology

## 2022-08-27 ENCOUNTER — Other Ambulatory Visit: Payer: Self-pay

## 2022-08-27 DIAGNOSIS — E1165 Type 2 diabetes mellitus with hyperglycemia: Secondary | ICD-10-CM

## 2022-08-27 MED ORDER — PEN NEEDLES 32G X 6 MM MISC: 3 refills | Status: DC

## 2022-09-15 ENCOUNTER — Telehealth: Payer: Self-pay

## 2022-09-15 NOTE — Telephone Encounter (Signed)
Pt called he is on the 2 mg dose of Ozempic and has recently had some stomach issues. He has not previously had any issues with Ozempic but he wants to hold off on taking the Ozempic injection for now to see if he can settle his stomach issues. Pt took an imodium and says that help but he was wanting a rx sent in that can help. Pt wants to know if he can add a few days between injections?

## 2022-09-16 NOTE — Telephone Encounter (Signed)
Patient informed and expressed his understanding. Pt is so advise [place] if symptoms began worsening. States that he is feeling better.

## 2022-09-18 ENCOUNTER — Ambulatory Visit: Payer: Medicare HMO | Attending: Cardiology | Admitting: Cardiology

## 2022-09-18 ENCOUNTER — Encounter: Payer: Self-pay | Admitting: Cardiology

## 2022-09-18 VITALS — BP 118/70 | HR 67 | Ht 68.0 in | Wt 214.6 lb

## 2022-09-18 DIAGNOSIS — E119 Type 2 diabetes mellitus without complications: Secondary | ICD-10-CM | POA: Diagnosis not present

## 2022-09-18 DIAGNOSIS — E7800 Pure hypercholesterolemia, unspecified: Secondary | ICD-10-CM

## 2022-09-18 DIAGNOSIS — I6523 Occlusion and stenosis of bilateral carotid arteries: Secondary | ICD-10-CM | POA: Diagnosis not present

## 2022-09-18 DIAGNOSIS — I2510 Atherosclerotic heart disease of native coronary artery without angina pectoris: Secondary | ICD-10-CM | POA: Diagnosis not present

## 2022-09-18 DIAGNOSIS — I10 Essential (primary) hypertension: Secondary | ICD-10-CM

## 2022-09-18 NOTE — Progress Notes (Signed)
Cardiology Office Note:    Date:  09/18/2022   ID:  [name] [name], DOB [birth year], MRN [id number]  PCP:  Shon Baton, MD   [facility] Providers Cardiologist:  Candee Furbish, MD     Referring MD: Shon Baton, MD    History of Present Illness:    [name] [name] is a 72 y.o. male here for follow-up coronary artery disease post CABG and PCI to RCA prior to bypass. Asymptomanic prior. Dr. Linna Darner during PE, ETT abnormal. Father had CAD.  Retired Engineer, structural.  Back issue steroid injections.  HCTZ in the past for blood pressure.  Denies any fevers chills nausea vomiting syncope bleeding.  Back surgery x 2 once a [facility]. Playing golf now.  He is very impressed with the physician over there that helped him with his back.  Has had some musculoskeletal/GI leg discomfort at times left of sternum.  Had some numbness post bypass.  Obsessed with blood sugar. Monitoring. Ozempic - GI symptoms, but now better.   Past Medical History:  Diagnosis Date   Allergy    1-2 x a year    Aortic valve sclerosis    echo june 2009   Arthritis    fingers, back    CAD (coronary artery disease)    a. 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 b. s/p CABG 04/2011.   Carotid artery disease (HCC)    Doppler, preop, September, 2012,  mild bilateral plaque with no significant stenoses   Diabetes mellitus    Dizziness    June, 2013   Ejection fraction    a. Ejection fraction 60%, echo, June, 2009. b. EF preserved by cath 2012.   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  05/01/2011   pilonidal cystectomy     POLYPECTOMY     TIBIA FRACTURE SURGERY      Current Medications: Current Meds  Medication Sig   aspirin 81 MG tablet Take 81 mg by mouth daily.   atorvastatin (LIPITOR) 40 MG tablet TAKE 1 TABLET BY MOUTH EVERY DAY   Continuous Blood Gluc Receiver (DEXCOM G6 RECEIVER) DEVI Use as direction to check sugar daily.   Continuous Blood Gluc Sensor (DEXCOM G6 SENSOR) MISC Use to monitor blood sugar, change after 10 days   Continuous Blood Gluc Transmit (DEXCOM G6 TRANSMITTER) MISC Change 1 Device every 90 days.   FARXIGA 10 MG TABS tablet TAKE 1 TABLET(10 MG) BY MOUTH DAILY   fenofibrate micronized (LOFIBRA) 200  MG capsule TAKE 1 CAPSULE BY MOUTH EVERY DAY   glucose blood (ONETOUCH VERIO) test strip USE AS DIRECTED TO TEST SUGAR ONCE DAILY   glucose blood test strip USE TO TEST BLOOD SUGAR TWICE DAILY DX CODE E11.65   HUMULIN R U-500 KWIKPEN 500 UNIT/ML KwikPen INJECT 20 UNITS UNDER THE SKIN BEFORE BREAKFAST, 30 UNITS AT LUNCH, 35 UNITS AT DINNER. MAY CHANGE BY 5 UNITS BASED ON MEALS.   hydrochlorothiazide (HYDRODIURIL) 25 MG tablet Take 1 tablet (25 mg total) by mouth daily.   insulin degludec (TRESIBA FLEXTOUCH) 200 UNIT/ML FlexTouch Pen ADMINISTER 144 UNITS UNDER THE SKIN DAILY WITH SUPPER   Insulin Pen Needle (BD PEN NEEDLE MICRO U/F) 32G X 6 MM MISC USE TO INJECT INSULIN TWICE DAILY   Insulin Pen Needle (PEN NEEDLES) 32G X 6 MM MISC USE TO INJECT INSULIN TWICE DAILY   Lancets (ONETOUCH DELICA PLUS Q000111Q) MISC USE ONCE DAILY TO TEST BLOOD SUGAR   metFORMIN (GLUCOPHAGE-XR) 500 MG 24 hr tablet TAKE 4  TABLETS BY MOUTH DAILY WITH SUPPER   NOVOFINE PEN NEEDLE 32G X 6 MM MISC USE TO INJECT INSULIN TWICE DAILY.   Omega-3 Fatty Acids (FISH OIL PO) Take by mouth.   ramipril (ALTACE) 5 MG capsule TAKE 1 CAPSULE(5 MG) BY MOUTH TWICE DAILY (Patient taking differently: Take 5 mg by mouth 2 (two) times daily.)   Semaglutide, 1 MG/DOSE, 4 MG/3ML SOPN Inject 1 mg as directed once a week.   Semaglutide, 2 MG/DOSE, (OZEMPIC, 2 MG/DOSE,) 8 MG/3ML SOPN Inject 2 mg weekly   Current Facility-Administered Medications for the 09/18/22 encounter (Office Visit) with Jerline Pain, MD  Medication   0.9 %  sodium chloride infusion     Allergies:   Rosuvastatin   Social History   Socioeconomic History   Marital status: Single    Spouse name: Not on file   Number of children: Not on file   Years of education: Not on file   Highest education level: Not on file  Occupational History   Occupation: Engineer, structural   Tobacco Use   Smoking status: Never   Smokeless tobacco: Never  Substance and Sexual Activity   Alcohol use: No   Drug use: No   Sexual activity: Not on file  Other Topics Concern   Not on file  Social History Narrative   Not on file   Social Determinants of Health   Financial Resource Strain: Not on file  Food Insecurity: Not on file  Transportation Needs: Not on file  Physical Activity: Not on file  Stress: Not on file  Social Connections: Not on file     Family History: The patient's family history includes Cancer in his maternal grandfather; Diabetes in his father, mother, paternal aunt, and paternal uncle; Hearing loss in his father and paternal grandfather; Heart attack in his paternal grandfather; Heart attack (age of onset: 12) in his father; Heart failure in his maternal grandmother; Hypertension in his father; Prostate cancer in his maternal grandfather; Thyroid disease in his father. There is no history of Stroke, Colon cancer, Colon polyps, Esophageal cancer, Rectal cancer,  or Stomach cancer.  ROS:   Please see the history of present illness.     All other systems reviewed and are negative.  EKGs/Labs/Other Studies Reviewed:    The following studies were reviewed today: Prior office notes echocardiogram records reviewed  EKG:   09/18/2022: Sinus rhythm 67 nonspecific ST-T wave changes no change from prior. Previous sinus rhythm 83 PVC  noted no other abnormalities  Recent Labs: 04/15/2022: ALT 25; BUN 12; Creatinine, Ser 0.66; Potassium 3.9; Sodium 140  Recent Lipid Panel    Component Value Date/Time   CHOL 112 04/15/2022 1148   TRIG 203.0 (H) 04/15/2022 1148   HDL 37.70 (L) 04/15/2022 1148   CHOLHDL 3 04/15/2022 1148   VLDL 40.6 (H) 04/15/2022 1148   LDLCALC 48 09/17/2021 1026   LDLDIRECT 56.0 04/15/2022 1148     Risk Assessment/Calculations:          Physical Exam:    VS:  BP 118/70   Pulse 67   Ht 5' 8"$  (1.727 m)   Wt 214 lb 9.6 oz (97.3 kg)   SpO2 98%   BMI 32.63 kg/m     Wt Readings from Last 3 Encounters:  09/18/22 214 lb 9.6 oz (97.3 kg)  08/21/22 219 lb 12.8 oz (99.7 kg)  04/15/22 219 lb 3.2 oz (99.4 kg)     GEN: Well nourished, well developed, in no acute distress HEENT: normal Neck: no JVD, carotid bruits, or masses Cardiac: RRR; no murmurs, rubs, or gallops,no edema  Respiratory:  clear to auscultation bilaterally, normal work of breathing GI: soft, nontender, nondistended, + BS MS: no deformity or atrophy Skin: warm and dry, no rash Neuro:  Alert and Oriented x 3, Strength and sensation are intact Psych: euthymic mood, full affect   ASSESSMENT:    1. Coronary artery disease involving native coronary artery of native heart without angina pectoris   2. Pure hypercholesterolemia   3. Bilateral carotid artery stenosis   4. Diabetes mellitus with coincident hypertension (HCC)     PLAN:    In order of problems listed above:   CAD (coronary artery disease) Overall doing well post CABG x4 LIMA to LAD left  radial to obtuse marginal SVG to first diagonal and vein graft to descending posterior.  This was done in 2012 by Dr. Roxy Manns.  Continue with goal-directed medical therapy.  Aspirin atorvastatin ACE inhibitor.-Working hard on blood sugars.  Diabetes mellitus with coincident hypertension (HCC) Prior hemoglobin A1c 6.8.  Improved.  Excellent.  Continue with good overall blood pressure control.   Dr. Dwyane Dee has been watching his diabetes closely.  He does admit he is a bit obsessed with it.  He remembers Dr. Roxy Manns telling him to make sure his blood sugar was under good control.  Pure hypercholesterolemia On atorvastatin 40 mg, prior LDL 57.  Excellent.  At goal.  Was also on Vascepa excellent but had to stop because insurance wont cover.  Now on Lovaza.  Help reduce triglycerides.  Interestingly, he had episode of diarrhea nausea vomiting.  He vomited with he believes to be 3 or 4 maybe 5 of undissolved Lovaza and is stomach contents.  Carotid artery disease [facility]) Father had carotid endarterectomy.  Continue with statin aspirin.  Most recently checked mild.  No changes made    222 pounds down to 213.     Medication Adjustments/Labs and Tests Ordered: Current medicines are reviewed at length with the patient today.  Concerns regarding medicines are outlined above.  Orders Placed This Encounter  Procedures   EKG 12-Lead   No orders of the defined types were placed in this encounter.    Patient Instructions  Medication Instructions:   Your physician recommends that you continue on your current medications as directed. Please refer to the Current Medication list given to you today.   *If you need a refill on your cardiac medications before your  next appointment, please call your pharmacy*   Lab Work:  None ordered.  If you have labs (blood work) drawn today and your tests are completely normal, you will receive your results only by: [place] (if you have MyChart) OR A paper copy  in the mail If you have any lab test that is abnormal or we need to change your treatment, we will call you to review the results.   Testing/Procedures:  None ordered.   Follow-Up: At [facility], you and your health needs are our priority.  As part of our continuing mission to provide you with exceptional heart care, we have created designated Provider Care Teams.  These Care Teams include your primary Cardiologist (physician) and Advanced Practice Providers (APPs -  Physician Assistants and Nurse Practitioners) who all work together to provide you with the care you need, when you need it.  We recommend signing up for the patient portal called "MyChart".  Sign up information is provided on this After Visit Summary.  MyChart is used to connect with patients for Virtual Visits (Telemedicine).  Patients are able to view lab/test results, encounter notes, upcoming appointments, etc.  Non-urgent messages can be sent to your provider as well.   To learn more about what you can do with MyChart, go to [email or web address].    Your next appointment:   1 year(s)  Provider:   Candee Furbish, MD     Other Instructions  Your physician wants you to follow-up in: 1 year with Dr. Marlou Porch.  You will receive a reminder letter in the mail two months in advance. If you don't receive a letter, please call our office to schedule the follow-up appointment.     Signed, Candee Furbish, MD  09/18/2022 9:22 AM     [facility]

## 2022-09-18 NOTE — Patient Instructions (Signed)
Medication Instructions:   Your physician recommends that you continue on your current medications as directed. Please refer to the Current Medication list given to you today.   *If you need a refill on your cardiac medications before your next appointment, please call your pharmacy*   Lab Work:  None ordered.  If you have labs (blood work) drawn today and your tests are completely normal, you will receive your results only by: [place] (if you have MyChart) OR A paper copy in the mail If you have any lab test that is abnormal or we need to change your treatment, we will call you to review the results.   Testing/Procedures:  None ordered.   Follow-Up: At [facility], you and your health needs are our priority.  As part of our continuing mission to provide you with exceptional heart care, we have created designated Provider Care Teams.  These Care Teams include your primary Cardiologist (physician) and Advanced Practice Providers (APPs -  Physician Assistants and Nurse Practitioners) who all work together to provide you with the care you need, when you need it.  We recommend signing up for the patient portal called "MyChart".  Sign up information is provided on this After Visit Summary.  MyChart is used to connect with patients for Virtual Visits (Telemedicine).  Patients are able to view lab/test results, encounter notes, upcoming appointments, etc.  Non-urgent messages can be sent to your provider as well.   To learn more about what you can do with MyChart, go to [email or web address].    Your next appointment:   1 year(s)  Provider:   Candee Furbish, MD     Other Instructions  Your physician wants you to follow-up in: 1 year with Dr. Marlou Porch.  You will receive a reminder letter in the mail two months in advance. If you don't receive a letter, please call our office to schedule the follow-up appointment.

## 2022-10-21 ENCOUNTER — Other Ambulatory Visit: Payer: Self-pay | Admitting: Endocrinology

## 2022-11-02 ENCOUNTER — Telehealth: Payer: Self-pay | Admitting: *Deleted

## 2022-11-02 ENCOUNTER — Other Ambulatory Visit: Payer: Self-pay | Admitting: *Deleted

## 2022-11-02 DIAGNOSIS — E1165 Type 2 diabetes mellitus with hyperglycemia: Secondary | ICD-10-CM

## 2022-11-02 MED ORDER — HUMULIN R U-500 KWIKPEN 500 UNIT/ML ~~LOC~~ SOPN: PEN_INJECTOR | SUBCUTANEOUS | 5 refills | Status: DC

## 2022-11-02 NOTE — Telephone Encounter (Signed)
sent 

## 2022-11-02 NOTE — Telephone Encounter (Signed)
Pt called regarding Humilin R--pt verified taking 45 units am, 25-40 lunch, and 45-50 units evening depending on scale. Pt unable to pick-up more refills. Please advise

## 2022-11-02 NOTE — Telephone Encounter (Signed)
Pt stated--medication is not enough until refill.

## 2022-11-20 ENCOUNTER — Other Ambulatory Visit: Payer: Self-pay | Admitting: Cardiology

## 2022-11-23 ENCOUNTER — Other Ambulatory Visit: Payer: Self-pay | Admitting: Cardiology

## 2022-12-23 ENCOUNTER — Other Ambulatory Visit (INDEPENDENT_AMBULATORY_CARE_PROVIDER_SITE_OTHER): Payer: Medicare HMO

## 2022-12-23 ENCOUNTER — Encounter: Payer: Self-pay | Admitting: Endocrinology

## 2022-12-23 ENCOUNTER — Ambulatory Visit: Payer: Medicare HMO | Admitting: Endocrinology

## 2022-12-23 VITALS — BP 124/64 | HR 72 | Ht 68.0 in | Wt 222.0 lb

## 2022-12-23 DIAGNOSIS — Z794 Long term (current) use of insulin: Secondary | ICD-10-CM | POA: Diagnosis not present

## 2022-12-23 DIAGNOSIS — E1165 Type 2 diabetes mellitus with hyperglycemia: Secondary | ICD-10-CM

## 2022-12-23 DIAGNOSIS — E782 Mixed hyperlipidemia: Secondary | ICD-10-CM

## 2022-12-23 DIAGNOSIS — I10 Essential (primary) hypertension: Secondary | ICD-10-CM | POA: Diagnosis not present

## 2022-12-23 LAB — COMPREHENSIVE METABOLIC PANEL
ALT: 26 U/L (ref 0–53)
AST: 19 U/L (ref 0–37)
Albumin: 4.2 g/dL (ref 3.5–5.2)
Alkaline Phosphatase: 33 U/L — ABNORMAL LOW (ref 39–117)
BUN: 11 mg/dL (ref 6–23)
CO2: 29 mEq/L (ref 19–32)
Calcium: 9.2 mg/dL (ref 8.4–10.5)
Chloride: 101 mEq/L (ref 96–112)
Creatinine, Ser: 0.66 mg/dL (ref 0.40–1.50)
GFR: 95.05 mL/min (ref >60.00)
Glucose, Bld: 172 mg/dL — ABNORMAL HIGH (ref 70–99)
Potassium: 4.2 mEq/L (ref 3.5–5.1)
Sodium: 136 mEq/L (ref 135–145)
Total Bilirubin: 0.8 mg/dL (ref 0.2–1.2)
Total Protein: 6.9 g/dL (ref 6.0–8.3)

## 2022-12-23 LAB — POCT GLYCOSYLATED HEMOGLOBIN (HGB A1C): Hemoglobin A1C: 6.6 % — AB (ref 4.0–5.6)

## 2022-12-23 LAB — LIPID PANEL
Cholesterol: 102 mg/dL (ref 0–200)
HDL: 31.2 mg/dL — ABNORMAL LOW (ref >39.00)
NonHDL: 70.83
Total CHOL/HDL Ratio: 3
Triglycerides: 204 mg/dL — ABNORMAL HIGH (ref 0.0–149.0)
VLDL: 40.8 mg/dL — ABNORMAL HIGH (ref 0.0–40.0)

## 2022-12-23 LAB — MICROALBUMIN / CREATININE URINE RATIO
Creatinine,U: 101.9 mg/dL
Microalb Creat Ratio: 1.2 mg/g (ref 0.0–30.0)
Microalb, Ur: 1.2 mg/dL (ref 0.0–1.9)

## 2022-12-23 LAB — LDL CHOLESTEROL, DIRECT: Direct LDL: 46 mg/dL

## 2022-12-23 NOTE — Progress Notes (Signed)
Patient ID: [name] [name], male   DOB: March 12, 1951, 72 y.o.   MRN: [id number]           Reason for Appointment: Follow-up for Type 2 Diabetes    History of Present Illness:          Date of diagnosis of type 2 diabetes mellitus: 2011        Background history:  He was probably started on metformin at diagnosis and subsequently given Amaryl also Subsequently had been on Janumet and more recently this was changed to [facility]. Because of poor control he was given Levemir in addition to his Kombiglyze in 01/2013 The dose was progressively increased from initial dose of 12 units He did have somewhat better control right after starting insulin with A1c below 7% but only one time His blood sugars had been consistently high since 2014 with A1c at least 8%  Recent history:   INSULIN regimen is described as: [name] 132 units daily.   Humulin R U-500, 45 units at breakfast,30-40 at lunch, and 40-60  at dinner  Non-insulin hypoglycemic drugs the patient is taking are: Metformin ER 2 g, OZEMPIC 1 or 2 mg weekly, Farxiga 5mg  every other day  A1c is 6.6, previously 6.8   Current blood sugar patterns, management and problems identified: He has not resume taking his Ozempic even though it was available a couple of months ago He said that he had an episode of vomiting and he thought it was from the Ozempic With this his blood sugars are relatively higher especially in the last week Also weight is up 8 pounds He has gone up on his [name] again  However has not increased his mealtime insulin especially in the evening and has higher readings after dinner fairly consistently  Overnight blood sugars are recently fairly good although overall variable No hypoglycemia He is not doing much walking because of knee pain and only some activities such as yard work As before is not always taking his Humulin R 30 minutes before eating   Side effects from medications have been: ?  Dizziness from  [employer]  Interpretation of the freestyle libre version 2 download for the last 2 weeks is as follows  HIGHEST blood sugars are around 9 PM and lowest 4 AM OVERNIGHT blood sugars are dropping between midnight and about 2 AM from relatively high levels and then leveling off in the low 100 range with some variability Premeal blood sugars are trending higher at dinnertime with averages below Although postprandial readings are on an average fairly good at breakfast and lunch they are mostly higher after dinner especially in the last week No hypoglycemia during the day or night Time in range is worse than before  CGM use % of time   2-week average/GV   Time in range        %  % Time Above 180   % Time above 250   % Time Below 70      PRE-MEAL Fasting Lunch Dinner Bedtime Overall  Glucose range:       Averages: 124       POST-MEAL PC Breakfast PC Lunch PC Dinner  Glucose range:     Averages: 150  157 199    Previously  CGM use % of time 87  2-week average/GV 148/26  Time in range      80  % was 63  % Time Above 180 19  % Time above 250 1  % Time Below 70  PRE-MEAL Fasting Lunch Dinner Bedtime Overall  Glucose range:       Averages: 137   198 148   POST-MEAL PC Breakfast PC Lunch PC Dinner  Glucose range:     Averages:  115 159    Self-care: The diet that the patient has been following is: tries to limit sweet drinks.      Meal times: Breakfast: 9 AM Lunch: 1 PM Dinner: 6 PM   Typical meal intake: Breakfast can occasionally be a fast food biscuit, lunch is a sandwich or vegetables, variable meal at suppertime.                Dietician visit, most recent: 6/16  Weight history: Highest  previously 256  Wt Readings from Last 3 Encounters:  12/23/22 222 lb (100.7 kg)  09/18/22 214 lb 9.6 oz (97.3 kg)  08/21/22 219 lb 12.8 oz (99.7 kg)    Glycemic control:   Lab Results  Component Value Date   HGBA1C 6.6 (A) 12/23/2022   HGBA1C 6.8 (A) 08/21/2022   HGBA1C  6.7 (A) 04/15/2022   Lab Results  Component Value Date   MICROALBUR 1.2 12/23/2022   LDLCALC 48 09/17/2021   CREATININE 0.66 12/23/2022    Other active problems: See review of systems    Allergies as of 12/23/2022       Reactions   Atorvastatin Other (See Comments)   Rosuvastatin Other (See Comments)   REACTION: myalgias        Medication List        Accurate as of Dec 23, 2022  2:39 PM. If you have any questions, ask your nurse or doctor.          STOP taking these medications    Dexcom G6 Transmitter Misc Stopped by: Reather Littler, MD       TAKE these medications    aspirin 81 MG tablet Take 81 mg by mouth daily.   atorvastatin 40 MG tablet Commonly known as: LIPITOR TAKE 1 TABLET BY MOUTH EVERY DAY   Farxiga 10 MG Tabs tablet Generic drug: dapagliflozin propanediol TAKE 1 TABLET(10 MG) BY MOUTH DAILY   fenofibrate micronized 200 MG capsule Commonly known as: LOFIBRA TAKE 1 CAPSULE BY MOUTH EVERY DAY   FISH OIL PO Take by mouth.   FreeStyle Libre 14 Day Reader Hardie Pulley by Does not apply route. What changed: Another medication with the same name was removed. Continue taking this medication, and follow the directions you see here. Changed by: Reather Littler, MD   FreeStyle Libre 14 Day Sensor Misc by Does not apply route. What changed: Another medication with the same name was removed. Continue taking this medication, and follow the directions you see here. Changed by: Reather Littler, MD   glucose blood test strip USE TO TEST BLOOD SUGAR TWICE DAILY DX CODE E11.65   OneTouch Verio test strip Generic drug: glucose blood USE AS DIRECTED TO TEST SUGAR ONCE DAILY   HumuLIN R U-500 KwikPen 500 UNIT/ML KwikPen Generic drug: insulin regular human CONCENTRATED Take 45 units am, 50 before lunch, and 60 before dinner What changed: additional instructions   hydrochlorothiazide 25 MG tablet Commonly known as: HYDRODIURIL TAKE 1 TABLET (25 MG TOTAL) BY MOUTH  DAILY.   metFORMIN 500 MG 24 hr tablet Commonly known as: GLUCOPHAGE-XR TAKE 4 TABLETS BY MOUTH DAILY WITH SUPPER   Novofine Pen Needle 32G X 6 MM Misc Generic drug: Insulin Pen Needle USE TO INJECT INSULIN TWICE DAILY.   BD Pen Needle  Micro U/F 32G X 6 MM Misc Generic drug: Insulin Pen Needle USE TO INJECT INSULIN TWICE DAILY   Pen Needles 32G X 6 MM Misc USE TO INJECT INSULIN TWICE DAILY   OneTouch Delica Plus Lancet30G Misc USE ONCE DAILY TO TEST BLOOD SUGAR   ramipril 5 MG capsule Commonly known as: ALTACE TAKE 1 CAPSULE(5 MG) BY MOUTH TWICE DAILY   Semaglutide (1 MG/DOSE) 4 MG/3ML Sopn Inject 1 mg as directed once a week.   Ozempic (2 MG/DOSE) 8 MG/3ML Sopn Generic drug: Semaglutide (2 MG/DOSE) Inject 2 mg weekly   Tresiba FlexTouch 200 UNIT/ML FlexTouch Pen Generic drug: insulin degludec ADMINISTER 144 UNITS UNDER THE SKIN DAILY WITH SUPPER        Allergies:  Allergies  Allergen Reactions   Atorvastatin Other (See Comments)   Rosuvastatin Other (See Comments)    REACTION: myalgias    Past Medical History:  Diagnosis Date   Allergy    1-2 x a year    Aortic valve sclerosis    echo june 2009   Arthritis    fingers, back    CAD (coronary artery disease)    a. 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 b. s/p CABG 04/2011.   Carotid artery disease (HCC)    Doppler, preop, September, 2012,  mild bilateral plaque with no significant stenoses   Diabetes mellitus    Dizziness    June, 2013   Ejection fraction    a. Ejection fraction 60%, echo, June, 2009. b. EF preserved by cath 2012.   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  05/01/2011   pilonidal cystectomy     POLYPECTOMY     TIBIA FRACTURE SURGERY      Family History  Problem Relation Age of Onset   Heart attack Paternal Grandfather        late 82s   Hearing loss Paternal Grandfather    Hypertension Father    Heart attack Father 19       bypass   Diabetes Father    Hearing loss Father    Thyroid disease Father    Cancer Maternal Grandfather        site unknown   Prostate cancer Maternal Grandfather    Diabetes Paternal Aunt        X 3   Heart failure Maternal Grandmother    Diabetes Mother    Diabetes Paternal Uncle    Stroke Neg Hx    Colon cancer  Neg Hx    Colon polyps Neg Hx    Esophageal cancer Neg Hx    Rectal cancer Neg Hx    Stomach cancer Neg Hx     Social History:  reports that he has never smoked. He has never used smokeless tobacco. He reports that he does not drink alcohol and does not use drugs.    Review of Systems   Lipid history: He has been taking fish oil and fenofibrate for baseline high triglycerides along with generic Lipitor 40 mg  Triglycerides have been variably high; his insurance will not cover Vascepa or Lovaza Currently on OTC omega-3 fish oil 2/day although recently has not taken this as he thought it was not dissolving in his stomach    Lab Results  Component Value Date   CHOL 102 12/23/2022   HDL 31.20 (L) 12/23/2022   LDLCALC 48 09/17/2021   LDLDIRECT 46.0 12/23/2022   TRIG 204.0 (H) 12/23/2022   CHOLHDL 3 12/23/2022            THYROID: He  had a 2.4 cm dominant left-sided thyroid nodule on Ultrasound in 12/2014  Follow-up showed the same size of the nodules  TSH was 1.8 done by PCP in 12/23  Lab Results  Component Value Date   TSH 2.11 04/28/2017    HYPERTENSION:  on  ramipril 10 mg and also HCTZ 25 mg from his cardiologist with consistently good control   BP Readings from Last 3 Encounters:  12/23/22 124/64  09/18/22 118/70  08/21/22 118/68   Eye exams with. Dr Hazle Quant  LABS:  Appointment on 12/23/2022  Component Date Value Ref Range Status   Microalb, Ur 12/23/2022 1.2  0.0 - 1.9 mg/dL Final   Creatinine,U 13/03/6577 101.9  mg/dL Final   Microalb Creat Ratio 12/23/2022 1.2  0.0 - 30.0 mg/g Final   Cholesterol 12/23/2022 102  0 - 200 mg/dL Final   ATP III Classification       Desirable:  < 200 mg/dL               Borderline High:  200 - 239 mg/dL          High:  > = 469 mg/dL   Triglycerides 62/95/2841 204.0 (H)  0.0 - 149.0 mg/dL Final   Normal:  <324 mg/dLBorderline High:  150 - 199 mg/dL   HDL 40/05/2724 36.64 (L)  >40.34 mg/dL Final   VLDL 74/25/9563 40.8 (H)  0.0 - 40.0 mg/dL Final   Total CHOL/HDL Ratio 12/23/2022 3   Final                  Men          Women1/2 Average Risk     3.4          3.3Average Risk          5.0          4.42X Average Risk          9.6          7.13X Average Risk          15.0          11.0                       NonHDL 12/23/2022 70.83   Final   NOTE:  Non-HDL goal should be 30 mg/dL higher than patient's LDL goal (i.e. LDL goal of < 70 mg/dL, would have non-HDL goal of <  100 mg/dL)   Sodium 16/05/9603 540  135 - 145 mEq/L Final   Potassium 12/23/2022 4.2  3.5 - 5.1 mEq/L Final   Chloride 12/23/2022 101  96 - 112 mEq/L Final   CO2 12/23/2022 29  19 - 32 mEq/L Final   Glucose, Bld 12/23/2022 172 (H)  70 - 99 mg/dL Final   BUN 98/06/9146 11  6 - 23 mg/dL Final   Creatinine, Ser 12/23/2022 0.66  0.40 - 1.50 mg/dL Final   Total Bilirubin 12/23/2022 0.8  0.2 - 1.2 mg/dL Final    Alkaline Phosphatase 12/23/2022 33 (L)  39 - 117 U/L Final   AST 12/23/2022 19  0 - 37 U/L Final   ALT 12/23/2022 26  0 - 53 U/L Final   Total Protein 12/23/2022 6.9  6.0 - 8.3 g/dL Final   Albumin 82/95/6213 4.2  3.5 - 5.2 g/dL Final   GFR 08/65/7846 95.05  >60.00 mL/min Final   Calculated using the CKD-EPI Creatinine Equation (2021)   Calcium 12/23/2022 9.2  8.4 - 10.5 mg/dL Final   Direct LDL 96/29/5284 46.0  mg/dL Final   Optimal:  <132 mg/dLNear or Above Optimal:  100-129 mg/dLBorderline High:  130-159 mg/dLHigh:  160-189 mg/dLVery High:  >190 mg/dL  Office Visit on 44/08/270  Component Date Value Ref Range Status   Hemoglobin A1C 12/23/2022 6.6 (A)  4.0 - 5.6 % Final    Physical Examination:  BP 124/64   Pulse 72   Ht 5\' 8"  (1.727 m)   Wt 222 lb (100.7 kg)   SpO2 98%   BMI 33.75 kg/m      ASSESSMENT:  Diabetes type 2 on insulin     See history of present illness for detailed discussion of  current management, blood sugar patterns and problems identified  His A1c is 6.6  He is on basal bolus insulin, mealtime currently U-500 insulin 3 times a day, inconsistent doses of Farxiga and metformin  His day-to-day management and blood sugar patterns from freestyle libre were discussed in detail  Recently his blood sugars are relatively high with average 152 over the last 2 weeks and generally higher after meals in the last week in the evening Also has had weight gain without taking Ozempic  Discussed that unlikely that he had an episode of vomiting related to Ozempic and he had taken this without side effects for quite some time before Unable to do much exercise   HYPERTENSION: Well controlled Will need follow-up electrolytes and renal function  Hyperlipidemia: Triglycerides are still relatively high but may be from stopping patient will   PLAN:  He will need to get back on Ozempic; since he only has a 2 mg dose available at home he can start with a sample of 0.5 mg  given today with rapidly increasing doses starting with 0.25 With this he can start cutting back on basal insulin Generally needs at least 50 to 60 units of mealtime coverage at dinnertime and discussed postprandial targets Reduce Tresiba while starting Ozempic and adjust based on fasting reading If tolerated will increase walking Restart fish oil and discussed that these likely dissolve slowly .[name]  Total visit time including counseling = 30 minutes  Patient Instructions  Ozempic 0.25 1x, then 0.5mg  x1 then 1 mg   Check blood sugars on waking up  Also check blood sugars about 2 hours after meals and do this after different meals by rotation  Recommended blood sugar levels on waking up are 90-130 and about 2  hours after meal is 130-160  Tresiba 124 with 60 R at supper       Reather Littler 12/23/2022, 2:39 PM   Note: This office note was prepared with Dragon voice recognition system technology. Any transcriptional errors that result from this process are unintentional.   Addendum: All labs normal

## 2022-12-23 NOTE — Patient Instructions (Addendum)
Ozempic 0.25 1x, then 0.5mg  x1 then 1 mg   Check blood sugars on waking up  Also check blood sugars about 2 hours after meals and do this after different meals by rotation  Recommended blood sugar levels on waking up are 90-130 and about 2 hours after meal is 130-160  Tresiba 124 with 60 R at supper

## 2022-12-24 ENCOUNTER — Encounter: Payer: Self-pay | Admitting: Endocrinology

## 2023-02-07 ENCOUNTER — Other Ambulatory Visit: Payer: Self-pay | Admitting: Endocrinology

## 2023-02-19 ENCOUNTER — Other Ambulatory Visit: Payer: Self-pay | Admitting: Endocrinology

## 2023-02-19 DIAGNOSIS — E1165 Type 2 diabetes mellitus with hyperglycemia: Secondary | ICD-10-CM

## 2023-02-22 ENCOUNTER — Telehealth: Payer: Self-pay

## 2023-02-22 NOTE — Telephone Encounter (Signed)
Patient is taking amoxicillin 500 mg, taken 3x daily, due to dental work. Patient blood sugar levels rise while on medication. Patient would like to know if he should increase his insulin. He has another week on the Amoxicillin. Please advise

## 2023-03-17 ENCOUNTER — Ambulatory Visit: Payer: Medicare HMO | Admitting: Endocrinology

## 2023-03-17 VITALS — BP 136/62 | HR 74 | Ht 68.0 in | Wt 224.4 lb

## 2023-03-17 DIAGNOSIS — E1165 Type 2 diabetes mellitus with hyperglycemia: Secondary | ICD-10-CM | POA: Diagnosis not present

## 2023-03-17 DIAGNOSIS — I10 Essential (primary) hypertension: Secondary | ICD-10-CM

## 2023-03-17 DIAGNOSIS — Z794 Long term (current) use of insulin: Secondary | ICD-10-CM | POA: Diagnosis not present

## 2023-03-17 LAB — POCT GLYCOSYLATED HEMOGLOBIN (HGB A1C): Hemoglobin A1C: 6.8 % — AB (ref 4.0–5.6)

## 2023-03-17 MED ORDER — TIRZEPATIDE 5 MG/0.5ML ~~LOC~~ SOAJ: 5.0000 mg | SUBCUTANEOUS | 1 refills | Status: DC

## 2023-03-17 MED ORDER — TIRZEPATIDE 2.5 MG/0.5ML ~~LOC~~ SOAJ
2.5000 mg | SUBCUTANEOUS | 0 refills | Status: DC
Start: 2023-03-17 — End: 2023-06-30

## 2023-03-17 NOTE — Progress Notes (Signed)
Patient ID: [name] [name], male   DOB: 06-27-1951, 72 y.o.   MRN: [id number]           Reason for Appointment: Follow-up for Type 2 Diabetes    History of Present Illness:          Date of diagnosis of type 2 diabetes mellitus: 2011        Background history:  He was probably started on metformin at diagnosis and subsequently given Amaryl also Subsequently had been on Janumet and more recently this was changed to [facility]. Because of poor control he was given Levemir in addition to his Kombiglyze in 01/2013 The dose was progressively increased from initial dose of 12 units He did have somewhat better control right after starting insulin with A1c below 7% but only one time His blood sugars had been consistently high since 2014 with A1c at least 8%  Recent history:   INSULIN regimen is described as: [name] 130 units daily.   Humulin R U-500, 45 units at breakfast, 30-40 at lunch, and 50-60  at dinner  Non-insulin hypoglycemic drugs the patient is taking are: Metformin ER 2 g,Farxiga 10 mg every other day  A1c is back up to 6.8   Current blood sugar patterns, management and problems identified: He has not continued taking his Ozempic since after moving up to 0.5 mg sample he again had an episode of severe vomiting With this his blood sugars now have started going up significantly and only 56% within target range His blood sugars appear to be higher most of the time but especially in the evenings; also appear to be higher in the last few days He says he has increased his [name] back up to 130 units but has not changed the Humulin R insulin Not doing much formal walking for exercise His weight is about the same Also he does not think he has increased his carbohydrate or caloric intake lately   Side effects from medications have been: ?  Dizziness from [employer]  Interpretation of the freestyle libre version 2 download for the last 2 weeks is as follows  HIGHEST blood  sugars are around 8-9 PM averaging 207 and lowest 4 AM OVERNIGHT blood sugars are quite variable and recently mostly higher, sometimes over 180 but overall variable without hypoglycemia Blood sugars are fairly consistently running about the same throughout the day but periodically will have higher readings after meals especially in the second week of the data Patterns after meals are inconsistent Also Premeal readings appear to be mostly high before lunch and dinner No hypoglycemia during the day or night Time in range is worse than before  CGM use % of time 94  2-week average/GV 176  Time in range      56  %  % Time Above 180 38  % Time above 250 6  % Time Below 70 0    PRE-MEAL Fasting Lunch Dinner Bedtime Overall  Glucose range:       Mean/median: 161       POST-MEAL PC Breakfast PC Lunch PC Dinner  Glucose range:     Mean/median: 184 185 207    Previously  PRE-MEAL Fasting Lunch Dinner Bedtime Overall  Glucose range:       Averages: 124       POST-MEAL PC Breakfast PC Lunch PC Dinner  Glucose range:     Averages: 150  157 199    Previously  CGM use % of time 87  2-week average/GV  148/26  Time in range      80  % was 63  % Time Above 180 19  % Time above 250 1  % Time Below 70      PRE-MEAL Fasting Lunch Dinner Bedtime Overall  Glucose range:       Averages: 137   198 148   POST-MEAL PC Breakfast PC Lunch PC Dinner  Glucose range:     Averages:  115 159    Self-care: The diet that the patient has been following is: tries to limit sweet drinks.      Meal times: Breakfast: 9 AM Lunch: 1 PM Dinner: 6 PM   Typical meal intake: Breakfast can occasionally be a fast food biscuit, lunch is a sandwich or vegetables, variable meal at suppertime.                Dietician visit, most recent: 6/16  Weight history: Highest  previously 256  Wt Readings from Last 3 Encounters:  03/17/23 224 lb 6.4 oz (101.8 kg)  12/23/22 222 lb (100.7 kg)  09/18/22 214 lb 9.6 oz  (97.3 kg)    Glycemic control:   Lab Results  Component Value Date   HGBA1C 6.8 (A) 03/17/2023   HGBA1C 6.6 (A) 12/23/2022   HGBA1C 6.8 (A) 08/21/2022   Lab Results  Component Value Date   MICROALBUR 1.2 12/23/2022   LDLCALC 48 09/17/2021   CREATININE 0.66 12/23/2022    Other active problems: See review of systems    Allergies as of 03/17/2023       Reactions   Atorvastatin Other (See Comments)   Rosuvastatin Other (See Comments)   REACTION: myalgias        Medication List        Accurate as of March 17, 2023 10:47 AM. If you have any questions, ask your nurse or doctor.          aspirin 81 MG tablet Take 81 mg by mouth daily.   atorvastatin 40 MG tablet Commonly known as: LIPITOR TAKE 1 TABLET BY MOUTH EVERY DAY   Farxiga 10 MG Tabs tablet Generic drug: dapagliflozin propanediol TAKE 1 TABLET(10 MG) BY MOUTH DAILY   fenofibrate micronized 200 MG capsule Commonly known as: LOFIBRA TAKE 1 CAPSULE BY MOUTH EVERY DAY   FISH OIL PO Take by mouth.   FreeStyle Libre 14 Day Reader Hardie Pulley by Does not apply route.   FreeStyle Libre 14 Day Sensor Misc by Does not apply route.   glucose blood test strip USE TO TEST BLOOD SUGAR TWICE DAILY DX CODE E11.65   OneTouch Verio test strip Generic drug: glucose blood USE AS DIRECTED TO TEST SUGAR ONCE DAILY   HumuLIN R U-500 KwikPen 500 UNIT/ML KwikPen Generic drug: insulin regular human CONCENTRATED Take 45 units am, 50 before lunch, and 60 before dinner What changed: additional instructions   hydrochlorothiazide 25 MG tablet Commonly known as: HYDRODIURIL TAKE 1 TABLET (25 MG TOTAL) BY MOUTH DAILY.   metFORMIN 500 MG 24 hr tablet Commonly known as: GLUCOPHAGE-XR TAKE 4 TABLETS BY MOUTH DAILY WITH SUPPER   Novofine Pen Needle 32G X 6 MM Misc Generic drug: Insulin Pen Needle USE TO INJECT INSULIN TWICE DAILY.   BD Pen Needle Micro U/F 32G X 6 MM Misc Generic drug: Insulin Pen Needle USE TO INJECT  INSULIN TWICE DAILY   Pen Needles 32G X 6 MM Misc USE TO INJECT INSULIN TWICE DAILY   OneTouch Delica Plus Lancet30G Misc USE ONCE DAILY TO  TEST BLOOD SUGAR   ramipril 5 MG capsule Commonly known as: ALTACE TAKE 1 CAPSULE(5 MG) BY MOUTH TWICE DAILY   Semaglutide (1 MG/DOSE) 4 MG/3ML Sopn Inject 1 mg as directed once a week.   Ozempic (2 MG/DOSE) 8 MG/3ML Sopn Generic drug: Semaglutide (2 MG/DOSE) Inject 2 mg weekly   Tresiba FlexTouch 200 UNIT/ML FlexTouch Pen Generic drug: insulin degludec ADMINISTER 124 UNITS UNDER THE SKIN DAILY WITH SUPPER        Allergies:  Allergies  Allergen Reactions   Atorvastatin Other (See Comments)   Rosuvastatin Other (See Comments)    REACTION: myalgias    Past Medical History:  Diagnosis Date   Allergy    1-2 x a year    Aortic valve sclerosis    echo june 2009   Arthritis    fingers, back    CAD (coronary artery disease)    a. 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 b. s/p CABG 04/2011.   Carotid artery disease (HCC)    Doppler, preop, September, 2012,  mild bilateral plaque with no significant stenoses   Diabetes mellitus    Dizziness    June, 2013   Ejection fraction    a. Ejection fraction 60%, echo, June, 2009. b. EF preserved by cath 2012.   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  05/01/2011   pilonidal cystectomy     POLYPECTOMY     TIBIA FRACTURE SURGERY      Family History  Problem Relation Age of Onset   Heart attack Paternal Grandfather        late 66s   Hearing loss Paternal Grandfather    Hypertension Father    Heart attack Father 38       bypass   Diabetes Father    Hearing loss Father    Thyroid disease Father    Cancer Maternal Grandfather        site unknown   Prostate cancer Maternal Grandfather    Diabetes Paternal Aunt        X 3   Heart failure Maternal Grandmother    Diabetes Mother    Diabetes Paternal Uncle    Stroke Neg Hx    Colon cancer Neg Hx    Colon polyps Neg Hx    Esophageal cancer Neg Hx    Rectal cancer Neg Hx    Stomach cancer Neg Hx     Social History:  reports that he has never smoked. He  has never used smokeless tobacco. He reports that he does not drink alcohol and does not use drugs.    Review of Systems   Lipid history: He has been taking fish oil and fenofibrate for baseline high triglycerides along with generic Lipitor 40 mg  Triglycerides have been variably high; his insurance will not cover Vascepa or Lovaza Currently on OTC omega-3 fish oil 2/day    Lab Results  Component Value Date   CHOL 102 12/23/2022   HDL 31.20 (L) 12/23/2022   LDLCALC 48 09/17/2021   LDLDIRECT 46.0 12/23/2022   TRIG 204.0 (H) 12/23/2022   CHOLHDL 3 12/23/2022            THYROID: He had a 2.4 cm dominant left-sided thyroid nodule on Ultrasound in 12/2014  Follow-up showed the same size of the nodules  TSH was 1.8 done by PCP in 12/23  Lab Results  Component Value Date   TSH 2.11 04/28/2017    HYPERTENSION:  on  ramipril 10 mg and also HCTZ 25 mg from his cardiologist with consistently good control   BP Readings from Last 3 Encounters:  03/17/23 136/62  12/23/22 124/64   09/18/22 118/70   Eye exams with. Dr Hazle Quant regularly but no reports available  LABS:  Office Visit on 03/17/2023  Component Date Value Ref Range Status   Hemoglobin A1C 03/17/2023 6.8 (A)  4.0 - 5.6 % Final    Physical Examination:  BP 136/62   Pulse 74   Ht 5\' 8"  (1.727 m)   Wt 224 lb 6.4 oz (101.8 kg)   SpO2 97%   BMI 34.12 kg/m      ASSESSMENT:  Diabetes type 2 on insulin     See history of present illness for detailed discussion of  current management, blood sugar patterns and problems identified  His A1c is 6.8 Recent GMI on sensor is 7.5  He is on basal bolus insulin, mealtime doses of U-500 insulin 3 times a day, every other day doses of Farxiga and metformin  His freestyle Josephine Igo was downloaded and his daily management was reviewed in detail  Recently his blood sugars are getting progressively higher and likely did better when he was on Ozempic which appears to be causing periodic vomiting Although he has not reportedly changed his diet much as postprandial readings are higher in the last week Also not exercising enough Despite high doses of basal insulin as overnight readings are recently high also   HYPERTENSION: Well controlled No history of microalbuminuria  Hyperlipidemia: Triglycerides need to be checked fasting   PLAN:  Since he may not tolerate Ozempic he can try Mounjaro 2.5 mg weekly He will take a prescription for 5 mg to be filled after 4 weeks Discussed how Mounjaro works, demonstrated the injection pen and given him patient information brochure on [place] For simplicity and more effective control and he will go up to 60 units of the Humulin R with each meal However discussed that if his blood sugars are lower after a certain dose he will reduce that amount by 10 units To call if having any difficulty adjusting insulin Also try to take [name] daily Will need fasting lipids with his PCP at his annual checkup  Total visit time including  counseling = 30 minutes  There are no Patient Instructions on file for this visit.      Reather Littler 03/17/2023, 10:47 AM   Note: This office note was prepared with Dragon voice recognition system technology. Any transcriptional  errors that result from this process are unintentional.

## 2023-03-17 NOTE — Patient Instructions (Addendum)
Walk daily  Humulin R 60 units at waking up and 30 min before each meal  2.5mg  Mounjaro weekly

## 2023-03-18 ENCOUNTER — Encounter: Payer: Self-pay | Admitting: Endocrinology

## 2023-04-07 ENCOUNTER — Other Ambulatory Visit: Payer: Self-pay

## 2023-04-07 ENCOUNTER — Telehealth: Payer: Self-pay

## 2023-04-07 DIAGNOSIS — E1165 Type 2 diabetes mellitus with hyperglycemia: Secondary | ICD-10-CM

## 2023-04-07 MED ORDER — HUMULIN R U-500 KWIKPEN 500 UNIT/ML ~~LOC~~ SOPN
PEN_INJECTOR | SUBCUTANEOUS | 1 refills | Status: DC
Start: 2023-04-07 — End: 2023-04-26

## 2023-04-07 NOTE — Telephone Encounter (Signed)
Contact patient to schedule with new provider.

## 2023-04-26 ENCOUNTER — Other Ambulatory Visit: Payer: Self-pay | Admitting: Internal Medicine

## 2023-04-26 DIAGNOSIS — E1165 Type 2 diabetes mellitus with hyperglycemia: Secondary | ICD-10-CM

## 2023-05-26 ENCOUNTER — Other Ambulatory Visit: Payer: Self-pay

## 2023-05-26 DIAGNOSIS — E1165 Type 2 diabetes mellitus with hyperglycemia: Secondary | ICD-10-CM

## 2023-05-26 DIAGNOSIS — I10 Essential (primary) hypertension: Secondary | ICD-10-CM

## 2023-05-26 MED ORDER — TRESIBA FLEXTOUCH 200 UNIT/ML ~~LOC~~ SOPN
PEN_INJECTOR | SUBCUTANEOUS | 1 refills | Status: DC
Start: 2023-05-26 — End: 2023-06-30

## 2023-05-26 MED ORDER — RAMIPRIL 5 MG PO CAPS
5.0000 mg | ORAL_CAPSULE | Freq: Two times a day (BID) | ORAL | 1 refills | Status: DC
Start: 2023-05-26 — End: 2023-12-07

## 2023-06-30 ENCOUNTER — Encounter: Payer: Self-pay | Admitting: Endocrinology

## 2023-06-30 ENCOUNTER — Ambulatory Visit: Payer: Medicare HMO | Admitting: Endocrinology

## 2023-06-30 VITALS — BP 138/70 | HR 71 | Resp 20 | Ht 68.0 in | Wt 222.8 lb

## 2023-06-30 DIAGNOSIS — Z794 Long term (current) use of insulin: Secondary | ICD-10-CM

## 2023-06-30 DIAGNOSIS — E118 Type 2 diabetes mellitus with unspecified complications: Secondary | ICD-10-CM

## 2023-06-30 DIAGNOSIS — E041 Nontoxic single thyroid nodule: Secondary | ICD-10-CM | POA: Diagnosis not present

## 2023-06-30 LAB — POCT GLYCOSYLATED HEMOGLOBIN (HGB A1C): Hemoglobin A1C: 6.6 % — AB (ref 4.0–5.6)

## 2023-06-30 MED ORDER — TIRZEPATIDE 7.5 MG/0.5ML ~~LOC~~ SOAJ: 7.5000 mg | SUBCUTANEOUS | 4 refills | Status: DC

## 2023-06-30 MED ORDER — TRESIBA FLEXTOUCH 200 UNIT/ML ~~LOC~~ SOPN: PEN_INJECTOR | SUBCUTANEOUS | 4 refills | Status: DC

## 2023-06-30 MED ORDER — HUMULIN R U-500 KWIKPEN 500 UNIT/ML ~~LOC~~ SOPN: PEN_INJECTOR | SUBCUTANEOUS | 4 refills | Status: DC

## 2023-06-30 MED ORDER — DAPAGLIFLOZIN PROPANEDIOL 5 MG PO TABS: 5.0000 mg | ORAL_TABLET | Freq: Every day | ORAL | 3 refills | Status: DC

## 2023-06-30 MED ORDER — METFORMIN HCL ER 500 MG PO TB24
ORAL_TABLET | ORAL | 3 refills | Status: AC
Start: 2023-06-30 — End: ?

## 2023-06-30 NOTE — Progress Notes (Signed)
Outpatient Endocrinology Note [name] Blaise Grieshaber, MD  06/30/23  Patient's Name: [name] [name]    DOB: [birth year]    MRN: [id number]                                                    REASON OF VISIT: Follow up for type 2 diabetes mellitus  PCP: Creola Corn, MD  HISTORY OF PRESENT ILLNESS:   [name] [name] is a 72 y.o. old male with past medical history listed below, is here for follow up for type 2 diabetes mellitus.   Pertinent Diabetes History: Patient was diagnosed with type 2 diabetes mellitus in 2011.  Insulin therapy was started in 2014.  Chronic Diabetes Complications : Retinopathy: no. Last ophthalmology exam was done on annually, following with ophthalmology regularly.  Nephropathy: no, on ACE/ARB /ramipril. Peripheral neuropathy: yes, complaints of numbness and tingling of the feet. Coronary artery disease: yes, s/p CABG 2012 Stroke: no  Relevant comorbidities and cardiovascular risk factors: Obesity: yes Body mass index is 33.88 kg/m.  Hypertension: Yes  Hyperlipidemia : Yes, on statin   Current / Home Diabetic regimen includes:  INSULIN regimen is described as: Tresiba 120 units daily.    Humulin R U-500, 30 - 50 units at breakfast, 30 - 50 at lunch, and 30 - 50  at dinner.   Non-insulin hypoglycemic drugs the patient is taking are: Metformin ER 2 g,Farxiga 5 mg every other day  Mounjaro 5 mg weekly.   Prior diabetic medications: Janumet, Levemir, Kombiglyze in the past. Ozempic had significant nausea and vomiting on 2 mg dose.  Switch to [facility], in August 2024. ?  Dizziness with Invokana.  Frequent urination with higher dose of SGLT2 inhibitor/Farxiga.  Glycemic data:    CONTINUOUS GLUCOSE MONITORING SYSTEM (CGMS) INTERPRETATION: At today's visit, we reviewed CGM downloads. The full report is scanned in the media. Reviewing the CGM trends, blood glucose are as follows:  FreeStyle Libre 2 CGM-  Sensor Download (Sensor download was reviewed and  summarized below.) Dates: November 14 to June 30, 2023, 14 days Sensor Average: 157  Glucose Management Indicator: 7.1% Glucose Variability: 23.3%  % data captured: 96%  Glycemic Trends:  <54: 0% 54-70: 1% 71-180: 71% 181-250: 27% 251-400: 1%   Interpretation: -Mostly acceptable blood sugar.  He had occasional hyperglycemia with meals especially at dinnertime with blood sugar up to 270 range.  Rare hypoglycemia happened twice in last 2 weeks with blood sugar in 60s in the middle of night.  No frequent hypoglycemia.  Hypoglycemia: Patient has minor hypoglycemic episodes. Patient has hypoglycemia awareness.  Factors modifying glucose control: 1.  Diabetic diet assessment: 3 meals a day.  2.  Staying active or exercising: Golf and walking.  3.  Medication compliance: compliant all of the time.  THYROID Nodule: He had a 2.4 cm dominant left-sided thyroid nodule on Ultrasound in 12/2014  Follow-up showed the same size of the nodules in July 2017.   TSH was 1.8 done by PCP in 12/23  Interval history  Diabetes regimen as noted above.  CGM data as reviewed above.  He has complaints of numbness and tingling of the feet.  Otherwise no complaints today.  Hemoglobin A1c recently 6.6%.  REVIEW OF SYSTEMS As per history of present illness.   PAST MEDICAL HISTORY: Past Medical History:  Diagnosis Date  Allergy    1-2 x a year    Aortic valve sclerosis    echo june 2009   Arthritis    fingers, back    CAD (coronary artery disease)    a. 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 b. s/p CABG 04/2011.   Carotid artery disease (HCC)    Doppler, preop, September, 2012,  mild bilateral plaque with no significant stenoses   Diabetes mellitus    Dizziness    June, 2013   Ejection fraction    a. Ejection fraction 60%, echo, June, 2009. b. EF preserved by cath 2012.   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  05/01/2011   pilonidal cystectomy     POLYPECTOMY     TIBIA FRACTURE SURGERY      ALLERGIES: Allergies  Allergen Reactions   Atorvastatin Other (See Comments)   Rosuvastatin Other (See Comments)    REACTION: myalgias    FAMILY HISTORY:  Family History  Problem Relation Age of Onset   Heart attack Paternal Grandfather        late 97s   Hearing loss Paternal Grandfather    Hypertension Father    Heart attack Father 26       bypass   Diabetes Father    Hearing loss Father    Thyroid disease Father    Cancer Maternal Grandfather        site unknown   Prostate cancer Maternal Grandfather    Diabetes Paternal Aunt        X 3   Heart failure Maternal Grandmother    Diabetes Mother    Diabetes Paternal Uncle    Stroke Neg Hx    Colon cancer Neg Hx    Colon polyps Neg Hx    Esophageal cancer Neg Hx    Rectal cancer Neg Hx    Stomach cancer Neg Hx     SOCIAL HISTORY: Social History   Socioeconomic History   Marital status: Single    Spouse name: Not on file   Number of children: Not on file   Years of education: Not on file   Highest education level: Not on file  Occupational History   Occupation: Emergency planning/management officer   Tobacco Use    Smoking status: Never   Smokeless tobacco: Never  Substance and Sexual Activity   Alcohol use: No   Drug use: No   Sexual activity: Not on file  Other Topics Concern  Not on file  Social History Narrative   Not on file   Social Determinants of Health   Financial Resource Strain: Not on file  Food Insecurity: No Food Insecurity (09/10/2020)   Received from [facility], [facility]   Hunger Vital Sign    Worried About Running Out of Food in the Last Year: Never true    Ran Out of Food in the Last Year: Never true  Transportation Needs: Not on file  Physical Activity: Not on file  Stress: No Stress Concern Present (04/30/2020)   Received from [facility], [facility] of Occupational Health - Occupational Stress Questionnaire    Feeling of Stress : Not at all  Social Connections: Unknown (12/16/2021)   Received from [facility], [facility]   Social Network    Social Network: Not on file    MEDICATIONS:  Current Outpatient Medications  Medication Sig Dispense Refill   aspirin 81 MG tablet Take 81 mg by mouth daily.     atorvastatin (LIPITOR) 40 MG tablet TAKE 1 TABLET BY MOUTH EVERY DAY 90 tablet 3   Continuous Glucose Receiver (FREESTYLE LIBRE 14 DAY READER) DEVI by Does not apply route.     Continuous Glucose Sensor (FREESTYLE LIBRE 14 DAY SENSOR) MISC by Does not apply route.     fenofibrate micronized (LOFIBRA) 200 MG capsule TAKE 1 CAPSULE BY MOUTH EVERY DAY 90 capsule 1   glucose blood (ONETOUCH VERIO) test strip USE AS DIRECTED TO TEST SUGAR ONCE DAILY 100 strip 0   glucose blood test strip USE TO TEST BLOOD SUGAR TWICE DAILY DX CODE E11.65 100 each 6   hydrochlorothiazide (HYDRODIURIL) 25 MG tablet TAKE 1 TABLET (25 MG TOTAL) BY MOUTH DAILY. 90 tablet 3   Insulin Pen Needle (BD PEN NEEDLE MICRO U/F) 32G X 6 MM MISC USE TO INJECT INSULIN TWICE DAILY 100 each 2   Insulin Pen Needle (PEN NEEDLES) 32G X 6 MM MISC USE TO INJECT INSULIN TWICE  DAILY 100 each 3   Lancets (ONETOUCH DELICA PLUS LANCET30G) MISC USE ONCE DAILY TO TEST BLOOD SUGAR 100 each 2   NOVOFINE PEN NEEDLE 32G X 6 MM MISC USE TO INJECT INSULIN TWICE DAILY. 100 each 3   Omega-3 Fatty Acids (FISH OIL PO) Take by mouth.     ramipril (ALTACE) 5 MG capsule Take 1 capsule (5 mg total) by mouth 2 (two) times daily. 180 capsule 1   tirzepatide (MOUNJARO) 7.5 MG/0.5ML Pen Inject 7.5 mg into the skin once a week. 6 mL 4   dapagliflozin propanediol (FARXIGA) 5 MG TABS tablet Take 1 tablet (5 mg total) by mouth daily. 90 tablet 3   insulin degludec (TRESIBA FLEXTOUCH) 200 UNIT/ML FlexTouch Pen ADMINISTER 110 UNITS UNDER THE SKIN DAILY WITH SUPPER 30 mL 4   insulin regular human CONCENTRATED (HUMULIN R U-500 KWIKPEN) 500 UNIT/ML KwikPen Humulin R U-500, 45 units at breakfast, 30-40 at lunch, and 50-60  at dinner 12 mL 4   metFORMIN (GLUCOPHAGE-XR) 500 MG 24 hr tablet TAKE 4 TABLETS BY MOUTH DAILY WITH SUPPER 360 tablet 3   Current Facility-Administered Medications  Medication Dose Route Frequency Provider Last Rate Last Admin   0.9 %  sodium chloride infusion  500 mL Intravenous Continuous Danis, Starr Lake III, MD        PHYSICAL EXAM: Vitals:   06/30/23 1053  BP: 138/70  Pulse: 71  Resp: 20  SpO2: 97%  Weight: 222 lb 12.8 oz (101.1 kg)  Height: 5\' 8"  (1.727 m)   Body mass index is 33.88 kg/m.  Wt Readings from Last 3 Encounters:  06/30/23 222 lb 12.8 oz (101.1 kg)  03/17/23 224 lb 6.4 oz (101.8 kg)  12/23/22 222 lb (100.7 kg)    General: Well developed, well nourished male in no apparent distress.  HEENT: AT/[place], no external lesions.  Eyes: Conjunctiva clear and no icterus. Neck: Neck supple  Lungs: Respirations not labored Neurologic: Alert, oriented, normal speech Extremities / Skin: Dry. No sores or rashes noted.  Psychiatric: Does not appear depressed or anxious  Diabetic Foot Exam - Simple   Simple Foot Form Diabetic Foot exam was performed with the  following findings: Yes 06/30/2023 11:17 AM  Visual Inspection No deformities, no ulcerations, no other skin breakdown bilaterally: Yes Sensation Testing Intact to touch and monofilament testing bilaterally: Yes Pulse Check See comments: Yes Comments Bilaterally DP 2+.     LABS Reviewed Lab Results  Component Value Date   HGBA1C 6.6 (A) 06/30/2023   HGBA1C 6.8 (A) 03/17/2023   HGBA1C 6.6 (A) 12/23/2022   Lab Results  Component Value Date   FRUCTOSAMINE 278 05/08/2016   FRUCTOSAMINE 311 (H) 01/15/2016   Lab Results  Component Value Date   CHOL 102 12/23/2022   HDL 31.20 (L) 12/23/2022   LDLCALC 48 09/17/2021   LDLDIRECT 46.0 12/23/2022   TRIG 204.0 (H) 12/23/2022   CHOLHDL 3 12/23/2022   Lab Results  Component Value Date   MICRALBCREAT 1.2 12/23/2022   MICRALBCREAT 1.1 09/17/2021   Lab Results  Component Value Date   CREATININE 0.66 12/23/2022   Lab Results  Component Value Date   GFR 95.05 12/23/2022    ASSESSMENT / PLAN  1. Controlled type 2 diabetes mellitus with complication, with long-term current use of insulin (HCC)   2. Thyroid nodule     Diabetes Mellitus type 2, complicated by diabetic neuropathy/CAD. - Diabetic status / severity: Controlled.  Lab Results  Component Value Date   HGBA1C 6.6 (A) 06/30/2023    - Hemoglobin A1c goal : <7%  - Medications: See below.  Increase mounjaro to 7.5mg  weekly. Decrease tresiba to 110 units daily , if glucose persistently < 100 , decrease to 100 units daily. Adjust Humulin U500 R 30 - 50 units with meals three times a day. Farxiga 5 mg every other day.   Metformin extended release 2000 mg daily.   - Home glucose testing: CGM and check as needed. - Discussed/ Gave Hypoglycemia treatment plan.  # Consult : not required at this time.   # Annual urine for microalbuminuria/ creatinine ratio, no microalbuminuria currently, continue ACE/ARB /ramipril. Last  Lab Results  Component Value Date    MICRALBCREAT 1.2 12/23/2022    # Foot check nightly / neuropathy.  Discussed about gabapentin or Lyrica, patient does not want to take medications.  # Annual dilated diabetic eye exams.   - Diet: Make healthy diabetic food choices - Life style / activity / exercise: Discussed.  2. Blood pressure  -  BP Readings from Last 1 Encounters:  06/30/23 138/70    - Control is in target.  - No change in current plans.  3. Lipid status / Hyperlipidemia - Last  Lab Results  Component Value Date   LDLCALC 48 09/17/2021   - Continue atorvastatin 40 mg daily.  On fenofibrate.  On Feosol.  Managed by PCP.  # Left thyroid nodule -Ultrasound in 2016 and 2017 left thyroid nodule dominant measuring 2.4 cm. -Will  discuss further in coming follow-up visit, probably repeat ultrasound thyroid to monitor thyroid nodules.  Diagnoses and all orders for this visit:  Controlled type 2 diabetes mellitus with complication, with long-term current use of insulin (HCC) -     POCT glycosylated hemoglobin (Hb A1C) -     insulin regular human CONCENTRATED (HUMULIN R U-500 KWIKPEN) 500 UNIT/ML KwikPen; Humulin R U-500, 45 units at breakfast, 30-40 at lunch, and 50-60  at dinner -     metFORMIN (GLUCOPHAGE-XR) 500 MG 24 hr tablet; TAKE 4 TABLETS BY MOUTH DAILY WITH SUPPER -     insulin degludec (TRESIBA FLEXTOUCH) 200 UNIT/ML FlexTouch Pen; ADMINISTER 110 UNITS UNDER THE SKIN DAILY WITH SUPPER  Thyroid nodule  Other orders -     tirzepatide (MOUNJARO) 7.5 MG/0.5ML Pen; Inject 7.5 mg into the skin once a week. -     dapagliflozin propanediol (FARXIGA) 5 MG TABS tablet; Take 1 tablet (5 mg total) by mouth daily.    DISPOSITION Follow up in clinic in 3 months suggested.   All questions answered and patient verbalized understanding of the plan.  [name] Lelon Ikard, MD [facility] [address] [place], [address] [place], [place] [address] Phone # [phone]  At least part of this  note was generated using voice recognition software. Inadvertent word errors may have occurred, which were not recognized during the proofreading process.

## 2023-06-30 NOTE — Patient Instructions (Signed)
Diabetes regimen: Increase mounjaro to 7.5mg  weekly. Decrease tresiba to 110 units daily , if glucose persistently < 100 , decrease to 100 units daily. Adjust Humulin R 30 - 50 units with meals three times a day. Farxiga and metformin same dose.

## 2023-07-05 ENCOUNTER — Other Ambulatory Visit: Payer: Self-pay

## 2023-09-27 ENCOUNTER — Encounter: Payer: Self-pay | Admitting: *Deleted

## 2023-09-28 ENCOUNTER — Ambulatory Visit: Payer: Medicare HMO | Attending: Cardiology | Admitting: Cardiology

## 2023-09-28 ENCOUNTER — Encounter: Payer: Self-pay | Admitting: Cardiology

## 2023-09-28 VITALS — BP 130/80 | HR 70 | Ht 68.0 in | Wt 222.6 lb

## 2023-09-28 DIAGNOSIS — I10 Essential (primary) hypertension: Secondary | ICD-10-CM

## 2023-09-28 DIAGNOSIS — I6523 Occlusion and stenosis of bilateral carotid arteries: Secondary | ICD-10-CM | POA: Diagnosis not present

## 2023-09-28 DIAGNOSIS — E119 Type 2 diabetes mellitus without complications: Secondary | ICD-10-CM | POA: Diagnosis not present

## 2023-09-28 DIAGNOSIS — I2510 Atherosclerotic heart disease of native coronary artery without angina pectoris: Secondary | ICD-10-CM

## 2023-09-28 DIAGNOSIS — E7800 Pure hypercholesterolemia, unspecified: Secondary | ICD-10-CM

## 2023-09-28 NOTE — Progress Notes (Signed)
 Cardiology Office Note:  .   Date:  09/28/2023  ID:  [name] [name], DOB Apr 25, 1951, MRN [id number] PCP: Creola Corn, MD  [facility] Providers Cardiologist:  Donato Schultz, MD     History of Present Illness: .   [name] [name] is a 73 y.o. male Discussed with the use of AI scribe   History of Present Illness   [name] [name] is a 73 year old male with coronary artery disease who presents for follow-up post-bypass surgery.  He underwent bypass surgery in September 2012, which included LIMA to LAD, left radial artery to obtuse marginal, SVG to diagonal, and SVG to posterior descending. 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 He has no current chest pain, shortness of breath, or fainting episodes. Occasionally, he experiences a sensation that feels muscular rather than cardiac, particularly when lying on one side for too long, but it does not interfere with his activities such as playing golf twice a week.  He has a history of muscle aches with Crestor but has tolerated simvastatin in the past. Currently, he is on aspirin, atorvastatin for his coronary artery disease. His last direct LDL was 46, and his EKG shows sinus rhythm with sinus arrhythmia, with no PACs present.  He also has diabetes and hypertension. He was previously on [facility], which helped manage his blood sugar levels, but he stopped it after experiencing vomiting. His A1c is 6.6, indicating good blood sugar control.  He has mild carotid artery disease and continues on aspirin and statin therapy. His father had a carotid endarterectomy, indicating a family history of carotid artery disease.  Socially, he remains active, playing golf, fishing, and attending church. He is considering using a treadmill for exercise due to knee issues, as there is reduced cartilage in his knee.          ROS: No CP  Studies Reviewed: [name]    EKG Interpretation Date/Time:  Tuesday September 28 2023 10:13:58 EST Ventricular Rate:  70 PR Interval:  180 QRS Duration:  106 QT Interval:  404 QTC Calculation: 436 R Axis:   -22  Text Interpretation: Sinus rhythm with marked sinus arrhythmia When compared with ECG of 02-May-2011 07:50, Premature ventricular complexes are no longer Present ST no longer elevated in Lateral leads Confirmed by Donato Schultz ([id number]) on 09/28/2023 10:16:18 AM    Results   LABS Direct LDL: 46 (08/17/2022) ALT: 26 Creatinine: 0.6 A1c: 6.6  DIAGNOSTIC Cardiac catheterization: 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) EKG: Sinus rhythm with sinus arrhythmia, PACs no longer present (09/28/2023)     Risk Assessment/Calculations:            Physical Exam:   VS:  BP 130/80   Pulse 70   Ht 5\' 8"  (1.727 m)   Wt 222 lb 9.6 oz (101 kg)   SpO2 98%   BMI 33.85 kg/m    Wt Readings from Last 3 Encounters:  09/28/23 222 lb 9.6 oz (101 kg)  06/30/23 222 lb 12.8 oz (101.1 kg)  03/17/23 224 lb 6.4 oz (101.8 kg)    GEN: Well nourished, well developed in no acute distress NECK: No JVD; No carotid bruits CARDIAC: RRR, no murmurs, no rubs, no gallops RESPIRATORY:  Clear to auscultation without rales, wheezing or rhonchi  ABDOMEN: Soft, non-tender, non-distended EXTREMITIES:  No edema; No deformity   ASSESSMENT AND PLAN: .  Assessment and Plan    Coronary Artery Disease (CAD) 73 year old male with CAD, status post CABG in September 2012. 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 Currently asymptomatic with no chest pain, dyspnea, or syncope. EKG shows sinus rhythm with sinus arrhythmia, and no PACs. LDL is well-controlled at 46 mg/dL. Discussed the importance of maintaining blood glucose levels. Prefers cardiologist follow-ups over PA. - Continue aspirin - Continue atorvastatin 40mg  once a day with  refills - Schedule follow-up in one year - Advise to report any new or worsening symptoms immediately  Carotid Artery Disease Mild carotid artery disease. Family history of carotid endarterectomy in father. No new symptoms reported. - Continue aspirin - Continue statin  Diabetes Mellitus Diabetes well-controlled with an A1c of 6.6. Previously on Mounjaro, discontinued due to gastrointestinal side effects. Follow-up with Dr. Erroll Luna for further management. - Continue monitoring blood glucose levels - Follow up with Dr. Erroll Luna for diabetes management  Hypertension Hypertension managed with current medications. No recent issues reported. - Continue current antihypertensive regimen  Musculoskeletal Pain Intermittent musculoskeletal pain in the chest area, likely related to physical activity such as golf. Pain is positional and reproducible, suggesting a musculoskeletal origin rather than cardiac. Advised that if pain becomes more frequent or worrisome, a stress test may be considered. - Advise to monitor and report any changes in pain - Consider stress test if symptoms worsen or become more frequent  General Health Maintenance Active lifestyle with regular golf and fishing. Plans to start using a treadmill for exercise. Advised to listen to his body and stop if experiencing pain. - Encourage regular physical activity - Advise to use treadmill on a level surface - Monitor for any new symptoms or pain during exercise  Follow-up - Schedule follow-up appointment in one year.              Signed, Donato Schultz, MD

## 2023-09-28 NOTE — Patient Instructions (Signed)
 Medication Instructions:  The current medical regimen is effective;  continue present plan and medications.  *If you need a refill on your cardiac medications before your next appointment, please call your pharmacy*  Follow-Up: At [facility], you and your health needs are our priority.  As part of our continuing mission to provide you with exceptional heart care, we have created designated Provider Care Teams.  These Care Teams include your primary Cardiologist (physician) and Advanced Practice Providers (APPs -  Physician Assistants and Nurse Practitioners) who all work together to provide you with the care you need, when you need it.  We recommend signing up for the patient portal called "MyChart".  Sign up information is provided on this After Visit Summary.  MyChart is used to connect with patients for Virtual Visits (Telemedicine).  Patients are able to view lab/test results, encounter notes, upcoming appointments, etc.  Non-urgent messages can be sent to your provider as well.   To learn more about what you can do with MyChart, go to [email or web address].    Your next appointment:   1 year(s)  Provider:   Donato Schultz, MD           1st Floor: - Lobby - Registration  - Pharmacy  - Lab - Cafe  2nd Floor: - PV Lab - Diagnostic Testing (echo, CT, nuclear med)  3rd Floor: - Vacant  4th Floor: - TCTS (cardiothoracic surgery) - AFib Clinic - Structural Heart Clinic - Vascular Surgery  - Vascular Ultrasound  5th Floor: - HeartCare Cardiology (general and EP) - Clinical Pharmacy for coumadin, hypertension, lipid, weight-loss medications, and med management appointments    Valet parking services will be available as well.

## 2023-10-04 ENCOUNTER — Ambulatory Visit: Payer: Medicare HMO | Admitting: Endocrinology

## 2023-10-06 ENCOUNTER — Encounter: Payer: Self-pay | Admitting: Endocrinology

## 2023-10-06 ENCOUNTER — Ambulatory Visit: Payer: Medicare HMO | Admitting: Endocrinology

## 2023-10-06 VITALS — BP 112/62 | HR 68 | Ht 68.0 in | Wt 224.0 lb

## 2023-10-06 DIAGNOSIS — Z7984 Long term (current) use of oral hypoglycemic drugs: Secondary | ICD-10-CM | POA: Diagnosis not present

## 2023-10-06 DIAGNOSIS — E041 Nontoxic single thyroid nodule: Secondary | ICD-10-CM

## 2023-10-06 DIAGNOSIS — E1165 Type 2 diabetes mellitus with hyperglycemia: Secondary | ICD-10-CM

## 2023-10-06 DIAGNOSIS — Z794 Long term (current) use of insulin: Secondary | ICD-10-CM | POA: Diagnosis not present

## 2023-10-06 DIAGNOSIS — Z7985 Long-term (current) use of injectable non-insulin antidiabetic drugs: Secondary | ICD-10-CM | POA: Diagnosis not present

## 2023-10-06 LAB — POCT GLYCOSYLATED HEMOGLOBIN (HGB A1C): Hemoglobin A1C: 6.6 % — AB (ref 4.0–5.6)

## 2023-10-06 MED ORDER — TIRZEPATIDE 5 MG/0.5ML ~~LOC~~ SOAJ: 5.0000 mg | SUBCUTANEOUS | 4 refills | Status: AC

## 2023-10-06 NOTE — Progress Notes (Signed)
 Outpatient Endocrinology Note [place] Marlyne Totaro, MD  10/06/23  Patient's Name: [name] [name]    DOB: Jun 24, 1951    MRN: [id number]                                                    REASON OF VISIT: Follow up for type 2 diabetes mellitus  PCP: Creola Corn, MD  HISTORY OF PRESENT ILLNESS:   [name] [name] is a 73 y.o. old male with past medical history listed below, is here for follow up for type 2 diabetes mellitus.   Pertinent Diabetes History: Patient was diagnosed with type 2 diabetes mellitus in 2011.  Insulin therapy was started in 2014.  Chronic Diabetes Complications : Retinopathy: no. Last ophthalmology exam was done on annually, following with ophthalmology regularly.  Nephropathy: no, on ACE/ARB /ramipril. Peripheral neuropathy: yes, complaints of numbness and tingling of the feet. Coronary artery disease: yes, s/p CABG 2012 Stroke: no  Relevant comorbidities and cardiovascular risk factors: Obesity: yes Body mass index is 34.06 kg/m.  Hypertension: Yes  Hyperlipidemia : Yes, on statin   Current / Home Diabetic regimen includes:  INSULIN regimen is described as: Tresiba 105 - 135 units daily.    Humulin R U-500, 30 - 50 units at breakfast, 30 - 50 at lunch, and 30 - 50  at dinner.   Non-insulin hypoglycemic drugs the patient is taking are: Metformin ER 2 g,Farxiga 5 mg every other day  Mounjaro 7.5 mg weekly.  Has not taken for about 4 weeks   Prior diabetic medications: Janumet, Levemir, Kombiglyze in the past. Ozempic had significant nausea and vomiting on 2 mg dose.  Switch to [facility], in August 2024. ?  Dizziness with Invokana.  Frequent urination with higher dose of SGLT2 inhibitor/Farxiga.  Glycemic data:    CONTINUOUS GLUCOSE MONITORING SYSTEM (CGMS) INTERPRETATION: At today's visit, we reviewed CGM downloads. The full report is scanned in the media. Reviewing the CGM trends, blood glucose are as follows:  FreeStyle Libre 2 CGM-  Sensor Download  (Sensor download was reviewed and summarized below.) Dates: February 20 -  October 06, 2023, 14 days Sensor Average: 190 Glucose Management Indicator: 7.9%  % data captured: 98%    Interpretation: Patient has random hyperglycemia with blood sugar up to 250-270 and sometime up to 300 range postprandially, blood sugar overnight and in between the meals are mostly acceptable.  No hypoglycemia.  Hypoglycemia: Patient has minor hypoglycemic episodes. Patient has hypoglycemia awareness.  Factors modifying glucose control: 1.  Diabetic diet assessment: 3 meals a day.  2.  Staying active or exercising: Golf and walking.  3.  Medication compliance: compliant all of the time.  THYROID Nodule: He had a 2.4 cm dominant left-sided thyroid nodule on Ultrasound in 12/2014  Follow-up showed the same size of the nodules in July 2017.   TSH was 1.8 done by PCP in 12/23  Interval history  CGM data as reviewed and noted above.  Diabetes regimen reviewed and as noted above.  Patient mention he had sickness with stomach upset, diarrhea and vomiting about 4 to 5 weeks ago, has not been taking Mounjaro since then.  Mounjaro dose was increased from 5 to 7.5 mg weekly in the last visit in November. He has been using higher dose of insulin when off the [facility].  CGM data mostly  random hyperglycemia with GMI 7.9%.  Hemoglobin A1c today 6.6%.  He has no other complaints today.  No neck discomfort or compressive symptoms.  No hypo and hyperthyroid symptoms.  REVIEW OF SYSTEMS As per history of present illness.   PAST MEDICAL HISTORY: Past Medical History:  Diagnosis Date   Allergy    1-2 x a year    Aortic valve sclerosis    echo june 2009   Arthritis    fingers, back    CAD (coronary artery disease)    a. 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 b. s/p CABG 04/2011.   Carotid artery disease (HCC)    Doppler, preop, September, 2012,  mild bilateral plaque with no  significant stenoses   Diabetes mellitus    Dizziness    June, 2013   Ejection fraction    a. Ejection fraction 60%, echo, June, 2009. b. EF preserved by cath 2012.   Food poisoning    age 67   GERD (gastroesophageal reflux disease)    past hx    Hx of CABG    [name],  x4  May 01, 2011 limited to the LAD, left radial artery to the OM, SVG to diagonal, SVG to posterior descending   Hyperlipidemia    on meds    Hypertension    controlled    Muscle ache    on Crestor  09/04/09 CPK 193(7-232) tolerated simvastatin in the past   Neuromuscular disorder (HCC)    nerves down right leg    PVC (premature ventricular contraction) 06/2009   November, 2010   Rash     PAST SURGICAL HISTORY: Past Surgical History:  Procedure Laterality Date   BACK SURGERY     coccyx bone removed     COLONOSCOPY     CORONARY ARTERY BYPASS GRAFT  05/01/2011   CABG x4 with LIMA to LAD, LRA to OM, SVG to D1, SVG to PDA, EVH via left thigh   Intraoperative transesophageal echocardiography.  05/01/2011   pilonidal cystectomy     POLYPECTOMY     TIBIA FRACTURE SURGERY      ALLERGIES: Allergies  Allergen Reactions   Atorvastatin Other (See Comments)   Rosuvastatin Other (See Comments)    REACTION: myalgias    FAMILY HISTORY:  Family History  Problem Relation Age of Onset   Heart attack Paternal Grandfather        late 65s   Hearing loss Paternal Grandfather    Hypertension Father    Heart attack Father 27       bypass   Diabetes Father    Hearing loss Father    Thyroid disease Father    Cancer Maternal Grandfather        site unknown   Prostate cancer Maternal Grandfather    Diabetes Paternal Aunt        X 3   Heart failure Maternal Grandmother    Diabetes Mother    Diabetes Paternal Uncle    Stroke Neg Hx    Colon cancer Neg Hx    Colon polyps Neg Hx    Esophageal cancer Neg Hx    Rectal cancer Neg Hx    Stomach cancer Neg Hx     SOCIAL HISTORY: Social History   Socioeconomic  History   Marital status: Single    Spouse name: Not on file   Number of children: Not on file   Years of education: Not on file   Highest education  level: Not on file  Occupational History   Occupation: Emergency planning/management officer   Tobacco Use   Smoking status: Never   Smokeless tobacco: Never  Substance and Sexual Activity   Alcohol use: No   Drug use: No   Sexual activity: Not on file  Other Topics Concern   Not on file  Social History Narrative   Not on file   Social Drivers of Health   Financial Resource Strain: Not on file  Food Insecurity: No Food Insecurity (09/10/2020)   Received from [facility], [facility]   Hunger Vital Sign    Worried About Running Out of Food in the Last Year: Never true    Ran Out of Food in the Last Year: Never true  Transportation Needs: Not on file  Physical Activity: Not on file  Stress: No Stress Concern Present (04/30/2020)   Received from [facility], [facility] - Occupational Stress Questionnaire    Feeling of Stress : Not at all  Social Connections: Unknown (12/16/2021)   Received from [facility], [facility]   Social Network    Social Network: Not on file    MEDICATIONS:  Current Outpatient Medications  Medication Sig Dispense Refill   aspirin 81 MG tablet Take 81 mg by mouth daily.     atorvastatin (LIPITOR) 40 MG tablet TAKE 1 TABLET BY MOUTH EVERY DAY 90 tablet 3   Continuous Glucose Receiver (FREESTYLE LIBRE 14 DAY READER) DEVI by Does not apply route.     Continuous Glucose Sensor (FREESTYLE LIBRE 14 DAY SENSOR) MISC by Does not apply route.     dapagliflozin propanediol (FARXIGA) 5 MG TABS tablet Take 1 tablet (5 mg total) by mouth daily. (Patient taking differently: Take 5 mg by mouth daily. Per patient taking 1/2 tablet every other day) 90 tablet 3   fenofibrate micronized (LOFIBRA) 200 MG capsule TAKE 1 CAPSULE BY MOUTH EVERY DAY 90 capsule 1   glucose blood (ONETOUCH VERIO)  test strip USE AS DIRECTED TO TEST SUGAR ONCE DAILY 100 strip 0   glucose blood test strip USE TO TEST BLOOD SUGAR TWICE DAILY DX CODE E11.65 100 each 6   hydrochlorothiazide (HYDRODIURIL) 25 MG tablet TAKE 1 TABLET (25 MG TOTAL) BY MOUTH DAILY. 90 tablet 3   insulin degludec (TRESIBA FLEXTOUCH) 200 UNIT/ML FlexTouch Pen ADMINISTER 110 UNITS UNDER THE SKIN DAILY WITH SUPPER 30 mL 4   Insulin Pen Needle (BD PEN NEEDLE MICRO U/F) 32G X 6 MM MISC USE TO INJECT INSULIN TWICE DAILY 100 each 2   Insulin Pen Needle (PEN NEEDLES) 32G X 6 MM MISC USE TO INJECT INSULIN TWICE DAILY 100 each 3   insulin regular human CONCENTRATED (HUMULIN R U-500 KWIKPEN) 500 UNIT/ML KwikPen Humulin R U-500, 45 units at breakfast, 30-40 at lunch, and 50-60  at dinner 12 mL 4   Lancets (ONETOUCH DELICA PLUS LANCET30G) MISC USE ONCE DAILY TO TEST BLOOD SUGAR 100 each 2   metFORMIN (GLUCOPHAGE-XR) 500 MG 24 hr tablet TAKE 4 TABLETS BY MOUTH DAILY WITH SUPPER 360 tablet 3   NOVOFINE PEN NEEDLE 32G X 6 MM MISC USE TO INJECT INSULIN TWICE DAILY. 100 each 3   Omega-3 Fatty Acids (FISH OIL PO) Take by mouth.     ramipril (ALTACE) 5 MG capsule Take 1 capsule (5 mg total) by mouth 2 (two) times daily. 180 capsule 1   tirzepatide (MOUNJARO) 5 MG/0.5ML Pen Inject 5 mg into the skin once a  week. 6 mL 4   FARXIGA 10 MG TABS tablet 1/2 tablet Monday/Wednesday/Friday Orally Once a day (Patient not taking: Reported on 10/06/2023)     Current Facility-Administered Medications  Medication Dose Route Frequency Provider Last Rate Last Admin   0.9 %  sodium chloride infusion  500 mL Intravenous Continuous Danis, Starr Lake III, MD        PHYSICAL EXAM: Vitals:   10/06/23 1349  BP: 112/62  Pulse: 68  SpO2: 96%  Weight: 224 lb (101.6 kg)  Height: 5\' 8"  (1.727 m)    Body mass index is 34.06 kg/m.  Wt Readings from Last 3 Encounters:  10/06/23 224 lb (101.6 kg)  09/28/23 222 lb 9.6 oz (101 kg)  06/30/23 222 lb 12.8 oz (101.1 kg)     General: Well developed, well nourished male in no apparent distress.  HEENT: AT/[place], no external lesions.  Eyes: Conjunctiva clear and no icterus. Neck: Neck supple.  No thyromegaly.  Left thyroid nodule palpable. Lungs: Respirations not labored Neurologic: Alert, oriented, normal speech Extremities / Skin: Dry. No sores or rashes noted.  Psychiatric: Does not appear depressed or anxious  Diabetic Foot Exam - Simple   No data filed     LABS Reviewed Lab Results  Component Value Date   HGBA1C 6.6 (A) 10/06/2023   HGBA1C 6.6 (A) 06/30/2023   HGBA1C 6.8 (A) 03/17/2023   Lab Results  Component Value Date   FRUCTOSAMINE 278 05/08/2016   FRUCTOSAMINE 311 (H) 01/15/2016   Lab Results  Component Value Date   CHOL 102 12/23/2022   HDL 31.20 (L) 12/23/2022   LDLCALC 48 09/17/2021   LDLDIRECT 46.0 12/23/2022   TRIG 204.0 (H) 12/23/2022   CHOLHDL 3 12/23/2022   Lab Results  Component Value Date   MICRALBCREAT 1.2 12/23/2022   MICRALBCREAT 1.1 09/17/2021   Lab Results  Component Value Date   CREATININE 0.66 12/23/2022   Lab Results  Component Value Date   GFR 95.05 12/23/2022    ASSESSMENT / PLAN  1. Uncontrolled type 2 diabetes mellitus with hyperglycemia (HCC)   2. Thyroid nodule      Diabetes Mellitus type 2, complicated by diabetic neuropathy/CAD. - Diabetic status / severity: uncontrolled.  Lab Results  Component Value Date   HGBA1C 6.6 (A) 10/06/2023    - Hemoglobin A1c goal : <6.5%  Patient has random hyperglycemia.  He has been off of Mounjaro after he got sick about a month ago.  He was taking Mounjaro 7.5 mg weekly.  GMI on CGM 7.9%.  Discussed about portion control and avoiding/limiting carbohydrates.   - Medications: See below.  Decrease mounjaro from 7.5 to 5 mg weekly due to sickness.. Decrease tresiba to 105 units daily , if glucose persistently < 100 , decrease to 100 units daily. Adjust Humulin U500 R 30 - 50 units with meals three  times a day. Farxiga 5 mg every other day.   Metformin extended release 2000 mg daily.   - Home glucose testing: CGM and check as needed. - Discussed/ Gave Hypoglycemia treatment plan.  # Consult : not required at this time.   # Annual urine for microalbuminuria/ creatinine ratio, no microalbuminuria currently, continue ACE/ARB /ramipril. Last  Lab Results  Component Value Date   MICRALBCREAT 1.2 12/23/2022    # Foot check nightly / neuropathy.  Discussed about gabapentin or Lyrica, patient does not want to take medications.  # Annual dilated diabetic eye exams.   - Diet: Make healthy diabetic food choices -  Life style / activity / exercise: Discussed.  2. Blood pressure  -  BP Readings from Last 1 Encounters:  10/06/23 112/62    - Control is in target.  - No change in current plans.  3. Lipid status / Hyperlipidemia - Last  Lab Results  Component Value Date   LDLCALC 48 09/17/2021   - Continue atorvastatin 40 mg daily.  On fenofibrate.  On Feosol.  Managed by PCP.  # Left thyroid nodule -Ultrasound in 2016 and 2017 left thyroid nodule dominant measuring 2.4 cm. -Will ultrasound thyroid in 3 months on the same day of endocrine visit.  Ahkeem was seen today for follow-up.  Diagnoses and all orders for this visit:  Uncontrolled type 2 diabetes mellitus with hyperglycemia (HCC) -     POCT glycosylated hemoglobin (Hb A1C) -     tirzepatide (MOUNJARO) 5 MG/0.5ML Pen; Inject 5 mg into the skin once a week.  Thyroid nodule -     US THYROID; Future     DISPOSITION Follow up in clinic in 3 months suggested.  Labs on same day of the visit.   All questions answered and patient verbalized understanding of the plan.  [place]ra[place] Ayani Ospina, MD [facility] Endocrinology [facility] [address] [place], [address] [place], [place] [address] Phone # [phone]  At least part of this note was generated using voice recognition software. Inadvertent word errors may have  occurred, which were not recognized during the proofreading process.

## 2023-11-24 ENCOUNTER — Telehealth: Payer: Self-pay | Admitting: Endocrinology

## 2023-11-24 ENCOUNTER — Other Ambulatory Visit: Payer: Self-pay | Admitting: Cardiology

## 2023-11-24 NOTE — Telephone Encounter (Signed)
 MEDICATION: FreeStyle Libre 14 Day Sensor Continuous Glucose Sensor (FREESTYLE LIBRE 14 DAY SENSOR) MISC  PHARMACY:  [facility]  HAS THE PATIENT CONTACTED THEIR PHARMACY?  Yes  IS THIS A 90 DAY SUPPLY : Yes  IS PATIENT OUT OF MEDICATION: No  IF NOT; HOW MUCH IS LEFT: 1 Day  LAST APPOINTMENT DATE: @3 /12/2023  NEXT APPOINTMENT DATE:@6 /04/2024  DO WE HAVE YOUR PERMISSION TO LEAVE A DETAILED MESSAGE?: Yes  OTHER COMMENTS: PATIENT STATES THAT [facility] NEEDS OFFICE NOTES NEW PRESCRIPTION FOR THIS REFILL   **Let patient know to contact pharmacy at the end of the day to make sure medication is ready. **  ** Please notify patient to allow 48-72 hours to process**  **Encourage patient to contact the pharmacy for refills or they can request refills through [facility]**

## 2023-11-25 ENCOUNTER — Other Ambulatory Visit: Payer: Self-pay

## 2023-12-04 ENCOUNTER — Other Ambulatory Visit: Payer: Self-pay | Admitting: Cardiology

## 2023-12-07 ENCOUNTER — Other Ambulatory Visit: Payer: Self-pay

## 2023-12-07 DIAGNOSIS — I10 Essential (primary) hypertension: Secondary | ICD-10-CM

## 2023-12-07 MED ORDER — RAMIPRIL 5 MG PO CAPS: 5.0000 mg | ORAL_CAPSULE | Freq: Two times a day (BID) | ORAL | 1 refills | Status: DC

## 2024-01-03 ENCOUNTER — Other Ambulatory Visit: Payer: Self-pay

## 2024-01-03 DIAGNOSIS — E1165 Type 2 diabetes mellitus with hyperglycemia: Secondary | ICD-10-CM

## 2024-01-03 MED ORDER — FARXIGA 10 MG PO TABS: ORAL_TABLET | ORAL | 3 refills | Status: AC

## 2024-01-10 ENCOUNTER — Ambulatory Visit
Admission: RE | Admit: 2024-01-10 | Discharge: 2024-01-10 | Disposition: A | Discharge status: Home / Self Care | Source: Ambulatory Visit | Attending: Endocrinology

## 2024-01-10 ENCOUNTER — Ambulatory Visit: Admitting: Endocrinology

## 2024-01-10 ENCOUNTER — Ambulatory Visit: Payer: Self-pay | Admitting: Endocrinology

## 2024-01-10 ENCOUNTER — Encounter: Payer: Self-pay | Admitting: Endocrinology

## 2024-01-10 VITALS — BP 120/60 | HR 70 | Resp 20 | Ht 68.0 in | Wt 222.8 lb

## 2024-01-10 DIAGNOSIS — E118 Type 2 diabetes mellitus with unspecified complications: Secondary | ICD-10-CM

## 2024-01-10 DIAGNOSIS — Z794 Long term (current) use of insulin: Secondary | ICD-10-CM

## 2024-01-10 DIAGNOSIS — E041 Nontoxic single thyroid nodule: Secondary | ICD-10-CM

## 2024-01-10 DIAGNOSIS — E1165 Type 2 diabetes mellitus with hyperglycemia: Secondary | ICD-10-CM

## 2024-01-10 LAB — POCT GLYCOSYLATED HEMOGLOBIN (HGB A1C): Hemoglobin A1C: 6.5 % — AB (ref 4.0–5.6)

## 2024-01-10 MED ORDER — FREESTYLE LIBRE 3 PLUS SENSOR MISC: 1.0000 | 3 refills | Status: AC

## 2024-01-10 MED ORDER — TRESIBA FLEXTOUCH 200 UNIT/ML ~~LOC~~ SOPN: PEN_INJECTOR | SUBCUTANEOUS | 4 refills | Status: DC

## 2024-01-10 NOTE — Progress Notes (Signed)
 Outpatient Endocrinology Note [place] Alsha Meland, MD  01/10/24  Patient's Name: [name] [name]    DOB: Jan 17, 1951    MRN: [id number]                                                    REASON OF VISIT: Follow up for type 2 diabetes mellitus  PCP: Margarete Sharps, MD  HISTORY OF PRESENT ILLNESS:   [name] [name] is a 73 y.o. old male with past medical history listed below, is here for follow up for type 2 diabetes mellitus.   Pertinent Diabetes History: Patient was diagnosed with type 2 diabetes mellitus in 2011.  Insulin  therapy was started in 2014.  Chronic Diabetes Complications : Retinopathy: yes. Last ophthalmology exam was done on every few months, following with ophthalmology regularly.  Nephropathy: no, on ACE/ARB /ramipril . Peripheral neuropathy: yes, complaints of numbness and tingling of the feet. Coronary artery disease: yes, s/p CABG 2012 Stroke: no  Relevant comorbidities and cardiovascular risk factors: Obesity: yes Body mass index is 33.88 kg/m.  Hypertension: Yes  Hyperlipidemia : Yes, on statin   Current / Home Diabetic regimen includes:  INSULIN  regimen is described as: Tresiba  124 units daily.    Humulin  R U-500, 30 - 50 units at breakfast, 30 - 50 at lunch, and 30 - 50  at dinner.  He adjusts the dose.   Non-insulin  hypoglycemic drugs the patient is taking are: Metformin  ER 2 g, Farxiga  5 mg every other day  Mounjaro  5 mg weekly.    Prior diabetic medications: Janumet , Levemir , Kombiglyze in the past. Ozempic  had significant nausea and vomiting on 2 mg dose.  Switch to Mounjaro , in August 2024. ?  Dizziness with Invokana .  Frequent urination with higher dose of SGLT2 inhibitor/Farxiga . He had sickness with higher dose of Mounjaro  at 7.5 mg.  Glycemic data:    CONTINUOUS GLUCOSE MONITORING SYSTEM (CGMS) INTERPRETATION: At today's visit, we reviewed CGM downloads. The full report is scanned in the media. Reviewing the CGM trends, blood glucose are as  follows:  FreeStyle Libre 2 CGM-  Sensor Download (Sensor download was reviewed and summarized below.) Dates: May 27 to June 9 , 2025, 14 days Sensor Average: 169 Glucose Management Indicator: 7.4%  % data captured: 89%     Interpretation: Mostly acceptable blood sugar with random hyperglycemia with blood sugar up to 260 range ate different meals with breakfast, lunch or supper.  Rarely blood sugar over 300 range.  Most of the blood sugar in the acceptable range.  No hypoglycemia.  Hypoglycemia: Patient has no hypoglycemic episodes. Patient has hypoglycemia awareness.  Factors modifying glucose control: 1.  Diabetic diet assessment: 3 meals a day.  2.  Staying active or exercising: Golf and walking.  3.  Medication compliance: compliant all of the time.  THYROID  Nodule: He had a 2.4 cm dominant left-sided thyroid  nodule on Ultrasound in 12/2014  Follow-up showed the same size of the nodules in July 2017.   TSH was 1.8 done by PCP in 12/23  Interval history  CGM data as reviewed above.  He has been able to tolerate Mounjaro  5 mg weekly.  Insulin  regimen as reviewed above.  He adjust the dose of Humulin  R.  Hemoglobin A1c 6.5%.  He had ultrasound thyroid  this afternoon official report awaited.  No other complaints today.  REVIEW  OF SYSTEMS As per history of present illness.   PAST MEDICAL HISTORY: Past Medical History:  Diagnosis Date   Allergy    1-2 x a year    Aortic valve sclerosis    echo june 2009   Arthritis    fingers, back    CAD (coronary artery disease)    a. 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 b. s/p CABG 04/2011.   Carotid artery disease (HCC)    Doppler, preop, September, 2012,  mild bilateral plaque with no significant stenoses   Diabetes mellitus    Dizziness    June, 2013   Ejection fraction    a. Ejection fraction 60%, echo, June, 2009. b. EF preserved by cath 2012.   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  05/01/2011   pilonidal cystectomy     POLYPECTOMY     TIBIA FRACTURE SURGERY      ALLERGIES: Allergies  Allergen Reactions   Atorvastatin  Other (See Comments)   Rosuvastatin Other (See Comments)    REACTION: myalgias    FAMILY HISTORY:  Family History  Problem Relation Age of Onset   Heart attack Paternal Grandfather        late 74s   Hearing loss Paternal Grandfather    Hypertension Father    Heart attack Father 57       bypass   Diabetes Father    Hearing loss Father    Thyroid  disease Father    Cancer Maternal Grandfather        site unknown   Prostate cancer Maternal Grandfather    Diabetes Paternal Aunt        X 3   Heart failure Maternal Grandmother    Diabetes Mother    Diabetes Paternal Uncle    Stroke Neg Hx    Colon cancer Neg Hx    Colon polyps Neg Hx    Esophageal cancer Neg Hx    Rectal cancer Neg Hx    Stomach cancer Neg Hx     SOCIAL HISTORY: Social History   Socioeconomic History   Marital status: Single    Spouse name: Not on file   Number of children: Not on file   Years of education: Not on file   Highest education level: Not on file  Occupational History    Occupation: Emergency planning/management officer   Tobacco Use   Smoking status: Never   Smokeless tobacco: Never  Substance and Sexual Activity  Alcohol  use: No   Drug use: No   Sexual activity: Not on file  Other Topics Concern   Not on file  Social History Narrative   Not on file   Social Drivers of Health   Financial Resource Strain: Not on file  Food Insecurity: No Food Insecurity (09/10/2020)   Received from [facility], [facility]   Hunger Vital Sign    Worried About Running Out of Food in the Last Year: Never true    Ran Out of Food in the Last Year: Never true  Transportation Needs: Not on file  Physical Activity: Not on file  Stress: No Stress Concern Present (04/30/2020)   Received from [facility], [facility] - Occupational Stress Questionnaire    Feeling of Stress : Not at all  Social Connections: Unknown (12/16/2021)   Received from [facility], [facility]   Social Network    Social Network: Not on file    MEDICATIONS:  Current Outpatient Medications  Medication Sig Dispense Refill   aspirin 81 MG tablet Take 81 mg by mouth daily.     atorvastatin  (LIPITOR) 40 MG tablet TAKE 1 TABLET BY MOUTH EVERY DAY 90 tablet 3   Continuous Glucose Receiver (FREESTYLE LIBRE 14 DAY READER) DEVI by Does not apply route.     Continuous Glucose Sensor (FREESTYLE LIBRE 14 DAY SENSOR) MISC by Does not apply route.     Continuous Glucose Sensor (FREESTYLE LIBRE 3 PLUS SENSOR) MISC 1 each by Does not apply route continuous. Change every 15 days. 6 each 3   FARXIGA  10 MG TABS tablet 1/2 tablet Monday/Wednesday/Friday Orally Once a day 30 tablet 3   fenofibrate  micronized (LOFIBRA) 200 MG capsule TAKE 1 CAPSULE BY MOUTH EVERY DAY 90 capsule 1   glucose blood (ONETOUCH VERIO) test strip USE AS DIRECTED TO TEST SUGAR ONCE DAILY 100 strip 0   glucose blood test strip USE TO TEST BLOOD SUGAR TWICE DAILY DX CODE E11.65 100 each 6   hydrochlorothiazide   (HYDRODIURIL ) 25 MG tablet TAKE 1 TABLET (25 MG TOTAL) BY MOUTH DAILY. 90 tablet 3   Insulin  Pen Needle (BD PEN NEEDLE MICRO U/F) 32G X 6 MM MISC USE TO INJECT INSULIN  TWICE DAILY 100 each 2   Insulin  Pen Needle (PEN NEEDLES) 32G X 6 MM MISC USE TO INJECT INSULIN  TWICE DAILY 100 each 3   insulin  regular human CONCENTRATED (HUMULIN  R U-500 KWIKPEN) 500 UNIT/ML KwikPen Humulin  R U-500, 45 units at breakfast, 30-40 at lunch, and 50-60  at dinner 12 mL 4   Lancets (ONETOUCH DELICA PLUS LANCET30G) MISC USE ONCE DAILY TO TEST BLOOD SUGAR 100 each 2   metFORMIN  (GLUCOPHAGE -XR) 500 MG 24 hr tablet TAKE 4 TABLETS BY MOUTH DAILY WITH SUPPER 360 tablet 3   NOVOFINE PEN NEEDLE 32G X 6 MM MISC USE TO INJECT INSULIN  TWICE DAILY. 100 each 3   Omega-3 Fatty Acids (FISH OIL PO) Take by mouth.     ramipril  (ALTACE ) 5 MG capsule Take 1 capsule (5 mg total) by mouth 2 (two) times daily. 180 capsule 1   tirzepatide  (MOUNJARO ) 5 MG/0.5ML Pen Inject 5 mg into the skin once a week. 6 mL 4   insulin  degludec (TRESIBA  FLEXTOUCH) 200 UNIT/ML FlexTouch Pen ADMINISTER 124 UNITS UNDER THE SKIN DAILY WITH SUPPER 30 mL 4   Current Facility-Administered Medications  Medication Dose Route Frequency Provider Last Rate Last Admin   0.9 %  sodium chloride  infusion  500 mL Intravenous Continuous Kerby Pearson III, MD        PHYSICAL EXAM: Vitals:   01/10/24 1355  BP: 120/60  Pulse: 70  Resp: 20  SpO2: 98%  Weight: 222 lb 12.8 oz (101.1 kg)  Height: 5\' 8"  (1.727 m)     Body mass index is 33.88 kg/m.  Wt Readings from Last 3 Encounters:  01/10/24 222 lb 12.8 oz (101.1 kg)  10/06/23 224 lb (101.6 kg)  09/28/23 222 lb 9.6 oz (101 kg)    General: Well developed, well nourished male in no apparent distress.  HEENT: AT/[place], no external lesions.  Eyes: Conjunctiva clear and no icterus. Neck: Neck supple.  No thyromegaly.  Left thyroid  nodule palpable. Lungs: Respirations not labored Neurologic: Alert, oriented, normal  speech Extremities / Skin: Dry.  Psychiatric: Does not appear depressed or anxious  Diabetic Foot Exam - Simple   No data filed     LABS Reviewed Lab Results  Component Value Date   HGBA1C 6.5 (A) 01/10/2024   HGBA1C 6.6 (A) 10/06/2023   HGBA1C 6.6 (A) 06/30/2023   Lab Results  Component Value Date   FRUCTOSAMINE 278 05/08/2016   FRUCTOSAMINE 311 (H) 01/15/2016   Lab Results  Component Value Date   CHOL 102 12/23/2022   HDL 31.20 (L) 12/23/2022   LDLCALC 48 09/17/2021   LDLDIRECT 46.0 12/23/2022   TRIG 204.0 (H) 12/23/2022   CHOLHDL 3 12/23/2022   Lab Results  Component Value Date   MICRALBCREAT 1.2 12/23/2022   MICRALBCREAT 1.1 09/17/2021   Lab Results  Component Value Date   CREATININE 0.66 12/23/2022   Lab Results  Component Value Date   GFR 95.05 12/23/2022    ASSESSMENT / PLAN  1. Type 2 diabetes mellitus with hyperglycemia, with long-term current use of insulin  (HCC)   2. Thyroid  nodule   3. Controlled type 2 diabetes mellitus with complication, with long-term current use of insulin  (HCC)     Diabetes Mellitus type 2, complicated by diabetic neuropathy/CAD. - Diabetic status / severity: uncontrolled.  Lab Results  Component Value Date   HGBA1C 6.5 (A) 01/10/2024    - Hemoglobin A1c goal : <6.5%  Patient has random hyperglycemia.  Discussed about limiting carbohydrates for someone in the meal.  Discussed about timing of the Humulin  R advised to take 30 minutes before eating.   - Medications: See below.  Continue Mounjaro  5 mg weekly.. Continue Tresiba  124 units daily.   Adjust Humulin  U500 R 30 - 50 units with meals three times a day.  He feels comfortable adjusting dose of Humulin  R. Farxiga  5 mg every other day.   Metformin  extended release 2000 mg daily.   - Home glucose testing: CGM and check as needed.  He currently has freestyle libre 2.  He thinks he has a compatible phone to use libre 3+.  He is currently using libre 2 reader.  Sent  prescription for libre 3+. - Discussed/ Gave Hypoglycemia treatment plan.  # Consult : not required at this time.   # Annual urine for microalbuminuria/ creatinine ratio, no microalbuminuria currently, continue ACE/ARB /ramipril .  Will check labs today. Last  Lab Results  Component Value Date   MICRALBCREAT 1.2 12/23/2022    # Foot check nightly / neuropathy.  Discussed about gabapentin or Lyrica, patient does not want to take medications.  # Annual dilated diabetic eye exams.   - Diet: Make healthy diabetic food choices - Life style / activity / exercise: Discussed.  2. Blood pressure  -  BP Readings from Last 1 Encounters:  01/10/24 120/60    - Control is in target.  - No change in current plans.  3. Lipid status / Hyperlipidemia - Last  Lab Results  Component Value Date   LDLCALC 48 09/17/2021   - Continue atorvastatin  40 mg daily.  On fenofibrate .  On Feosol.  Managed by PCP. - Check lipid panel today.  # Left thyroid  nodule -Ultrasound in 2016 and 2017 left thyroid  nodule dominant measuring 2.4 cm. - He complete ultrasound thyroid  today, follow-up official radiology report.  Diagnoses and all orders for this visit:  Type 2 diabetes mellitus with hyperglycemia, with long-term current use of insulin  (HCC) -     POCT glycosylated hemoglobin (Hb A1C) -     Microalbumin / creatinine urine ratio -     Basic metabolic panel with GFR -     Lipid panel -     Continuous Glucose Sensor (FREESTYLE LIBRE 3 PLUS SENSOR) MISC; 1 each by Does not apply route continuous. Change every 15 days.  Thyroid  nodule  Controlled type 2 diabetes mellitus with complication, with long-term current use of insulin  (HCC) -     insulin  degludec (TRESIBA  FLEXTOUCH) 200 UNIT/ML FlexTouch Pen; ADMINISTER 124 UNITS UNDER THE SKIN DAILY WITH SUPPER    DISPOSITION Follow up in clinic in 3 months suggested.  Labs today as ordered.  All questions answered and patient verbalized understanding  of the plan.  [place] Isadore Bokhari, MD [facility] End[facility] [address] [place], [address] [place], [place] [address] Phone # [phone]  At least part of this note was generated using voice recognition software. Inadvertent word errors may have occurred, which were not recognized during the proofreading process.

## 2024-01-11 ENCOUNTER — Ambulatory Visit: Payer: Self-pay | Admitting: Endocrinology

## 2024-01-11 LAB — BASIC METABOLIC PANEL WITH GFR
BUN/Creatinine Ratio: 23 (calc) — ABNORMAL HIGH (ref 6–22)
BUN: 13 mg/dL (ref 7–25)
CO2: 26 mmol/L (ref 20–32)
Calcium: 9.1 mg/dL (ref 8.6–10.3)
Chloride: 104 mmol/L (ref 98–110)
Creat: 0.56 mg/dL — ABNORMAL LOW (ref 0.70–1.28)
Glucose, Bld: 124 mg/dL — ABNORMAL HIGH (ref 65–99)
Potassium: 3.9 mmol/L (ref 3.5–5.3)
Sodium: 141 mmol/L (ref 135–146)
eGFR: 105 mL/min/{1.73_m2} (ref >=60)

## 2024-01-11 LAB — MICROALBUMIN / CREATININE URINE RATIO
Creatinine, Urine: 90 mg/dL (ref 20–320)
Microalb Creat Ratio: 3 mg/g{creat} (ref <30)
Microalb, Ur: 0.3 mg/dL

## 2024-01-11 LAB — LIPID PANEL
Cholesterol: 110 mg/dL (ref <200)
HDL: 32 mg/dL — ABNORMAL LOW (ref >=40)
LDL Cholesterol (Calc): 47 mg/dL
Non-HDL Cholesterol (Calc): 78 mg/dL (ref <130)
Total CHOL/HDL Ratio: 3.4 (calc) (ref <5.0)
Triglycerides: 263 mg/dL — ABNORMAL HIGH (ref <150)

## 2024-02-08 ENCOUNTER — Other Ambulatory Visit: Payer: Self-pay

## 2024-02-08 ENCOUNTER — Telehealth: Payer: Self-pay | Admitting: Nutrition

## 2024-02-08 DIAGNOSIS — E1165 Type 2 diabetes mellitus with hyperglycemia: Secondary | ICD-10-CM

## 2024-02-08 MED ORDER — PEN NEEDLES 32G X 6 MM MISC: 3 refills | Status: AC

## 2024-02-08 NOTE — Telephone Encounter (Signed)
 Message left on my machine from patient saying he has heard back from his pharmacy saying that they have sent many requests for refill authorization for pen needles to his [facility] pharmacy .  Please do this today as he is out.  Thank  you

## 2024-02-11 ENCOUNTER — Other Ambulatory Visit: Payer: Self-pay | Admitting: Endocrinology

## 2024-02-11 DIAGNOSIS — E118 Type 2 diabetes mellitus with unspecified complications: Secondary | ICD-10-CM

## 2024-04-06 ENCOUNTER — Other Ambulatory Visit: Payer: Self-pay | Admitting: Endocrinology

## 2024-04-06 DIAGNOSIS — E118 Type 2 diabetes mellitus with unspecified complications: Secondary | ICD-10-CM

## 2024-04-07 ENCOUNTER — Telehealth: Payer: Self-pay

## 2024-04-07 DIAGNOSIS — Z794 Long term (current) use of insulin: Secondary | ICD-10-CM

## 2024-04-07 MED ORDER — HUMULIN R U-500 KWIKPEN 500 UNIT/ML ~~LOC~~ SOPN: PEN_INJECTOR | SUBCUTANEOUS | 4 refills | Status: AC

## 2024-04-07 NOTE — Telephone Encounter (Signed)
 Refill request complete

## 2024-04-17 ENCOUNTER — Ambulatory Visit: Admitting: Endocrinology

## 2024-04-17 ENCOUNTER — Ambulatory Visit: Payer: Self-pay | Admitting: Endocrinology

## 2024-04-17 ENCOUNTER — Encounter: Payer: Self-pay | Admitting: Endocrinology

## 2024-04-17 VITALS — BP 118/60 | HR 77 | Resp 20 | Ht 68.0 in | Wt 214.0 lb

## 2024-04-17 DIAGNOSIS — E1165 Type 2 diabetes mellitus with hyperglycemia: Secondary | ICD-10-CM

## 2024-04-17 DIAGNOSIS — Z794 Long term (current) use of insulin: Secondary | ICD-10-CM

## 2024-04-17 DIAGNOSIS — E041 Nontoxic single thyroid nodule: Secondary | ICD-10-CM

## 2024-04-17 LAB — POCT GLYCOSYLATED HEMOGLOBIN (HGB A1C): Hemoglobin A1C: 6.6 % — AB (ref 4.0–5.6)

## 2024-04-17 NOTE — Progress Notes (Signed)
 Outpatient Endocrinology Note [name], MD  04/17/24  Patient's Name: [name] [name]    DOB: March 23, 1951    MRN: [id number]                                                    REASON OF VISIT: Follow up for type 2 diabetes mellitus  PCP: Onita Rush, MD  HISTORY OF PRESENT ILLNESS:   JEOFFREY [name] is a 73 y.o. old male with past medical history listed below, is here for follow up for type 2 diabetes mellitus.   Pertinent Diabetes History: Patient was diagnosed with type 2 diabetes mellitus in 2011.  Insulin  therapy was started in 2014.  Chronic Diabetes Complications : Retinopathy: yes. Last ophthalmology exam was done on every few months, following with ophthalmology regularly.  Nephropathy: no, on ACE/ARB /ramipril . Peripheral neuropathy: yes, complaints of numbness and tingling of the feet. Coronary artery disease: yes, s/p CABG 2012 Stroke: no  Relevant comorbidities and cardiovascular risk factors: Obesity: yes Body mass index is 32.54 kg/m.  Hypertension: Yes  Hyperlipidemia : Yes, on statin   Current / Home Diabetic regimen includes:  INSULIN  regimen is described as: Tresiba  124 units daily.    Humulin  R U-500, 30 - 50 units at breakfast, 30 - 50 at lunch, and 30 - 50  at dinner.  He adjusts the dose.   Non-insulin  hypoglycemic drugs the patient is taking are: Metformin  ER 2 g, Farxiga  5 mg every other day  Mounjaro  5 mg weekly.    Prior diabetic medications: Janumet , Levemir , Kombiglyze in the past. Ozempic  had significant nausea and vomiting on 2 mg dose.  Switch to Mounjaro , in August 2024. ?  Dizziness with Invokana .  Frequent urination with higher dose of SGLT2 inhibitor/Farxiga . He had sickness with higher dose of Mounjaro  at 7.5 mg.  Glycemic data:   Not able to download glucose/freestyle libre 3 data in the clinic today.  Reviewed glucose data from the mobile phone app, he is mostly having hyperglycemia with blood sugar in the low 200s around  noon time.  No concerning hypoglycemia noted.  Hypoglycemia: Patient has no hypoglycemic episodes. Patient has hypoglycemia awareness.  Factors modifying glucose control: 1.  Diabetic diet assessment: 3 meals a day.  2.  Staying active or exercising: Golf and walking.  3.  Medication compliance: compliant all of the time.  THYROID  Nodule: He had a 2.4 cm dominant left-sided thyroid  nodule on Ultrasound in 12/2014  Follow-up showed the same size of the nodules in July 2017.  In June 2025  : ultrasound thyroid  reviewed bilateral multiple thyroid  nodules with dominant right thyroid  nodule measuring 1.2 cm and left thyroid  nodule measuring 1.0 cm.  Reassuring.  These nodules can be monitored with serial ultrasound.  No plan for biopsy at this time.    TSH was 1.8 done by PCP in 12/23  Interval history  Diabetes regimen as reviewed and noted above.  Not able to download CGM data and review however reviewed from the mobile phone app as noted above.  Hemoglobin A1c today 6.6%.  He has been tolerating Mounjaro  5 mg weekly well.  Patient reports he has not eaten anything after breakfast today.  Advised patient not to fast for several hours as he has been on large dose of basal insulin  to avoid hypoglycemia.  He does  not need fasting lab test for diabetes follow-up visit.  No other complaints today.  Patient has lost about 8 pounds of weight since last visit.  REVIEW OF SYSTEMS As per history of present illness.   PAST MEDICAL HISTORY: Past Medical History:  Diagnosis Date   Allergy    1-2 x a year    Aortic valve sclerosis    echo june 2009   Arthritis    fingers, back    CAD (coronary artery disease)    a. 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 b. s/p CABG 04/2011.   Carotid artery disease (HCC)    Doppler, preop, September, 2012,  mild bilateral plaque with no significant stenoses   Diabetes mellitus    Dizziness    June, 2013   Ejection  fraction    a. Ejection fraction 60%, echo, June, 2009. b. EF preserved by cath 2012.   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  05/01/2011   pilonidal cystectomy     POLYPECTOMY     TIBIA FRACTURE SURGERY      ALLERGIES: Allergies  Allergen Reactions   Atorvastatin  Other (See Comments)   Rosuvastatin Other (See Comments)    REACTION: myalgias    FAMILY HISTORY:  Family History  Problem Relation Age of Onset   Heart attack Paternal Grandfather        late 27s   Hearing loss Paternal Grandfather    Hypertension Father    Heart attack Father 5       bypass   Diabetes Father    Hearing loss Father    Thyroid  disease Father    Cancer Maternal Grandfather        site unknown   Prostate cancer Maternal Grandfather    Diabetes Paternal Aunt        X 3   Heart failure Maternal Grandmother    Diabetes Mother    Diabetes Paternal Uncle    Stroke Neg Hx    Colon cancer Neg Hx    Colon polyps Neg Hx    Esophageal cancer Neg Hx    Rectal cancer Neg Hx    Stomach cancer Neg Hx     SOCIAL HISTORY: Social History   Socioeconomic History   Marital status: Single    Spouse name: Not on file   Number of  children: Not on file   Years of education: Not on file   Highest education level: Not on file  Occupational History   Occupation: Emergency planning/management officer   Tobacco Use   Smoking status: Never   Smokeless tobacco: Never  Substance and Sexual Activity   Alcohol  use: No   Drug use: No   Sexual activity: Not on file  Other Topics Concern   Not on file  Social History Narrative   Not on file   Social Drivers of Health   Financial Resource Strain: Not on file  Food Insecurity: No Food Insecurity (09/10/2020)   Received from [facility]   Hunger Vital Sign    Within the past 12 months, you worried that your food would run out before you got the money to buy more.: Never true    Within the past 12 months, the food you bought just didn't last and you didn't have money to get more.: Never true  Transportation Needs: Not on file  Physical Activity: Not on file  Stress: No Stress Concern Present (04/30/2020)   Received from [facility] - Occupational Stress Questionnaire    Feeling of Stress : Not at all  Social Connections: Unknown (12/16/2021)   [facility]   Social Network    Social Network: Not on file    MEDICATIONS:  Current Outpatient Medications  Medication Sig Dispense Refill   aspirin 81 MG tablet Take 81 mg by mouth daily.     atorvastatin  (LIPITOR) 40 MG tablet TAKE 1 TABLET BY MOUTH EVERY DAY 90 tablet 3   Continuous Glucose Receiver (FREESTYLE LIBRE 14 DAY READER) DEVI by Does not apply route.     Continuous Glucose Sensor (FREESTYLE LIBRE 14 DAY SENSOR) MISC by Does not apply route.     Continuous Glucose Sensor (FREESTYLE LIBRE 3 PLUS SENSOR) MISC 1 each by Does not apply route continuous. Change every 15 days. 6 each 3   FARXIGA  10 MG TABS tablet 1/2 tablet Monday/Wednesday/Friday Orally Once a day 30 tablet 3   fenofibrate  micronized (LOFIBRA) 200 MG capsule TAKE 1 CAPSULE BY MOUTH EVERY DAY 90 capsule 1   glucose  blood (ONETOUCH VERIO) test strip USE AS DIRECTED TO TEST SUGAR ONCE DAILY 100 strip 0   glucose blood test strip USE TO TEST BLOOD SUGAR TWICE DAILY DX CODE E11.65 100 each 6   hydrochlorothiazide  (HYDRODIURIL ) 25 MG tablet TAKE 1 TABLET (25 MG TOTAL) BY MOUTH DAILY. 90 tablet 3   insulin  degludec (TRESIBA  FLEXTOUCH) 200 UNIT/ML FlexTouch Pen INJECT 110 UNITS UNDER THE SKIN DAILY WITH SUPPER 45 mL 3   Insulin  Pen Needle (BD PEN NEEDLE MICRO U/F) 32G X 6 MM MISC USE TO INJECT INSULIN  TWICE DAILY 100 each 2   Insulin  Pen Needle (PEN NEEDLES) 32G X 6 MM MISC USE TO INJECT INSULIN  TWICE DAILY 100 each 3   insulin  regular human CONCENTRATED (HUMULIN  R U-500 KWIKPEN) 500 UNIT/ML KwikPen INJECT 45 UNITS AT BREAKFAST, 30-40 UNITS AT LUNCH, AND 50-60 UNITS AT DINNER 12 mL 4   Lancets (ONETOUCH DELICA PLUS LANCET30G) MISC USE ONCE DAILY TO TEST BLOOD SUGAR 100 each 2   metFORMIN  (GLUCOPHAGE -XR) 500 MG 24 hr tablet TAKE 4 TABLETS BY MOUTH DAILY WITH SUPPER 360 tablet 3   NOVOFINE PEN NEEDLE 32G X 6 MM MISC USE TO INJECT INSULIN  TWICE DAILY. 100 each 3   Omega-3 Fatty Acids (FISH OIL PO) Take by mouth.     ramipril  (ALTACE ) 5 MG capsule Take 1 capsule (5 mg total) by mouth 2 (two) times daily. 180 capsule  1   tirzepatide  (MOUNJARO ) 5 MG/0.5ML Pen Inject 5 mg into the skin once a week. 6 mL 4   Current Facility-Administered Medications  Medication Dose Route Frequency Provider Last Rate Last Admin   0.9 %  sodium chloride  infusion  500 mL Intravenous Continuous Danis, Victory CROME III, MD        PHYSICAL EXAM: Vitals:   04/17/24 1444  BP: 118/60  Pulse: 77  Resp: 20  SpO2: 98%  Weight: 214 lb (97.1 kg)  Height: 5' 8 (1.727 m)     Body mass index is 32.54 kg/m.  Wt Readings from Last 3 Encounters:  04/17/24 214 lb (97.1 kg)  01/10/24 222 lb 12.8 oz (101.1 kg)  10/06/23 224 lb (101.6 kg)    General: Well developed, well nourished male in no apparent distress.  HEENT: AT/[place], no external lesions.   Eyes: Conjunctiva clear and no icterus. Neck: Neck supple.  No thyromegaly.  Left thyroid  nodule palpable. Lungs: Respirations not labored Neurologic: Alert, oriented, normal speech Extremities / Skin: Dry.  Psychiatric: Does not appear depressed or anxious  Diabetic Foot Exam - Simple   No data filed     LABS Reviewed Lab Results  Component Value Date   HGBA1C 6.6 (A) 04/17/2024   HGBA1C 6.5 (A) 01/10/2024   HGBA1C 6.6 (A) 10/06/2023   Lab Results  Component Value Date   FRUCTOSAMINE 278 05/08/2016   FRUCTOSAMINE 311 (H) 01/15/2016   Lab Results  Component Value Date   CHOL 110 01/10/2024   HDL 32 (L) 01/10/2024   LDLCALC 47 01/10/2024   LDLDIRECT 46.0 12/23/2022   TRIG 263 (H) 01/10/2024   CHOLHDL 3.4 01/10/2024   Lab Results  Component Value Date   MICRALBCREAT 3 01/10/2024   MICRALBCREAT 1.6 02/20/2010   Lab Results  Component Value Date   CREATININE 0.56 (L) 01/10/2024   Lab Results  Component Value Date   GFR 95.05 12/23/2022    ASSESSMENT / PLAN  1. Type 2 diabetes mellitus with hyperglycemia, with long-term current use of insulin  (HCC)   2. Thyroid  nodule     Diabetes Mellitus type 2, complicated by diabetic neuropathy/CAD. - Diabetic status / severity: controlled.  Lab Results  Component Value Date   HGBA1C 6.6 (A) 04/17/2024    - Hemoglobin A1c goal : <6.5%  Patient has random hyperglycemia, especially around noon time.  Discussed about timing of using Humulin  R U-500, advised to take preferably 30 minutes before eating.   - Medications: See below.  No changes today.  Continue Mounjaro  5 mg weekly.. Continue Tresiba  124 units daily.   Adjust Humulin  U500 R 30 - 50 units with meals three times a day.  He feels comfortable adjusting dose of Humulin  R. Farxiga  5 mg every other day.   Metformin  extended release 2000 mg daily.   Patient has been gradually losing weight.  Discussed that if blood sugar in the early morning fasting is  persistently less than 100 asked to call our clinic we need to decrease the dose of Tresiba .  Asked to call our clinic in a month we will try to download and review CGM data remotely.  - Home glucose testing: CGM and check as needed.  Freestyle libre 3+.  Check as needed.  - Discussed/ Gave Hypoglycemia treatment plan.  # Consult : not required at this time.   # Annual urine for microalbuminuria/ creatinine ratio, no microalbuminuria currently, continue ACE/ARB /ramipril .  Will check labs today. Last  Lab Results  Component Value Date   MICRALBCREAT 3 01/10/2024    # Foot check nightly / neuropathy.  Discussed about gabapentin or Lyrica, patient does not want to take medications.  # Annual dilated diabetic eye exams.   - Diet: Make healthy diabetic food choices - Life style / activity / exercise: Discussed.  2. Blood pressure  -  BP Readings from Last 1 Encounters:  04/17/24 118/60    - Control is in target.  - No change in current plans.  3. Lipid status / Hyperlipidemia - Last  Lab Results  Component Value Date   LDLCALC 47 01/10/2024   - Continue atorvastatin  40 mg daily.  On fenofibrate .  On Feosol.  Managed by PCP.  # Left thyroid  nodule -Ultrasound in 2016 and 2017 left thyroid  nodule dominant measuring 2.4 cm. - Ultrasound thyroid  reviewed bilateral multiple thyroid  nodules with dominant right thyroid  nodule measuring 1.2 cm and left thyroid  nodule measuring 1.0 cm.  Reassuring.  These nodules can be monitored with serial ultrasound.  No plan for biopsy at this time.  Will repeat ultrasound thyroid  in 1 to 2 years.   Diagnoses and all orders for this visit:  Type 2 diabetes mellitus with hyperglycemia, with long-term current use of insulin  (HCC) -     POCT glycosylated hemoglobin (Hb A1C)  Thyroid  nodule   DISPOSITION Follow up in clinic in 3 months suggested.    All questions answered and patient verbalized understanding of the plan.  [place] Duran Ohern,  MD [facility] Endocrinology [facility] [address] [place], [address] [place], [place] [address] Phone # [phone]  At least part of this note was generated using voice recognition software. Inadvertent word errors may have occurred, which were not recognized during the proofreading process.

## 2024-06-11 ENCOUNTER — Other Ambulatory Visit: Payer: Self-pay | Admitting: Endocrinology

## 2024-06-11 DIAGNOSIS — I10 Essential (primary) hypertension: Secondary | ICD-10-CM

## 2024-07-17 ENCOUNTER — Ambulatory Visit: Payer: Self-pay | Admitting: Endocrinology

## 2024-07-17 ENCOUNTER — Encounter: Payer: Self-pay | Admitting: Endocrinology

## 2024-07-17 ENCOUNTER — Ambulatory Visit: Admitting: Endocrinology

## 2024-07-17 VITALS — BP 134/80 | HR 71 | Resp 16 | Ht 68.0 in | Wt 222.0 lb

## 2024-07-17 DIAGNOSIS — E041 Nontoxic single thyroid nodule: Secondary | ICD-10-CM | POA: Diagnosis not present

## 2024-07-17 DIAGNOSIS — E1165 Type 2 diabetes mellitus with hyperglycemia: Secondary | ICD-10-CM | POA: Diagnosis not present

## 2024-07-17 DIAGNOSIS — E118 Type 2 diabetes mellitus with unspecified complications: Secondary | ICD-10-CM | POA: Diagnosis not present

## 2024-07-17 DIAGNOSIS — Z794 Long term (current) use of insulin: Secondary | ICD-10-CM | POA: Diagnosis not present

## 2024-07-17 LAB — POCT GLYCOSYLATED HEMOGLOBIN (HGB A1C): Hemoglobin A1C: 6.8 % — AB (ref 4.0–5.6)

## 2024-07-17 MED ORDER — METFORMIN HCL ER 500 MG PO TB24: ORAL_TABLET | ORAL | 3 refills | Status: AC

## 2024-07-17 NOTE — Progress Notes (Signed)
 Outpatient Endocrinology Note Jamahl Lemmons, MD  07/17/2024  Patient's Name: [name] [name]    DOB: 1951/02/03    MRN: [id number]                                                    REASON OF VISIT: Follow up for type 2 diabetes mellitus  PCP: Onita Rush, MD  HISTORY OF PRESENT ILLNESS:   [name] [name] is a 73 y.o. old male with past medical history listed below, is here for follow up for type 2 diabetes mellitus.   Pertinent Diabetes History: Patient was diagnosed with type 2 diabetes mellitus in 2011.  Insulin  therapy was started in 2014.  Chronic Diabetes Complications : Retinopathy: yes. Last ophthalmology exam was done on every few months, following with ophthalmology regularly.  Nephropathy: no, on ACE/ARB /ramipril . Peripheral neuropathy: yes, complaints of numbness and tingling of the feet. Coronary artery disease: yes, s/p CABG 2012 Stroke: no  Relevant comorbidities and cardiovascular risk factors: Obesity: yes Body mass index is 33.75 kg/m.  Hypertension: Yes  Hyperlipidemia : Yes, on statin   Current / Home Diabetic regimen includes:  INSULIN  regimen is described as: Tresiba  124 units daily.    Humulin  R U-500, 30 - 50 units at breakfast, 30 - 50 at lunch, and 30 - 50  at dinner.  He adjusts the dose.   Non-insulin  hypoglycemic drugs the patient is taking are: Metformin  ER 2 g, Farxiga  5 mg every other day  Mounjaro  5 mg weekly.    Prior diabetic medications: Janumet , Levemir , Kombiglyze in the past. Ozempic  had significant nausea and vomiting on 2 mg dose.  Switch to Mounjaro , in August 2024. ?  Dizziness with Invokana .  Frequent urination with higher dose of SGLT2 inhibitor/Farxiga . He had sickness with higher dose of Mounjaro  at 7.5 mg.  Glycemic data:    CONTINUOUS GLUCOSE MONITORING SYSTEM (CGMS) INTERPRETATION:                         FreeStyle Libre 3 CGM-  Sensor Download (Sensor download was reviewed and summarized below.) Dates: December  2 to July 17, 2024, 14 days    Impression: - Mostly acceptable blood sugar with frequent mild hyperglycemia with blood sugar in low 200s around late morning and occasionally hyperglycemia with blood sugar up to 280 range at bedtime.  Most of the other times acceptable blood sugar including overnight and in between the meals.  No hypoglycemia.   Hypoglycemia: Patient has no hypoglycemic episodes. Patient has hypoglycemia awareness.  Factors modifying glucose control: 1.  Diabetic diet assessment: 3 meals a day.  2.  Staying active or exercising: Golf and walking.  3.  Medication compliance: compliant all of the time.  THYROID  Nodule: He had a 2.4 cm dominant left-sided thyroid  nodule on Ultrasound in 12/2014  Follow-up showed the same size of the nodules in July 2017.  In June 2025  : ultrasound thyroid  reviewed bilateral multiple thyroid  nodules with dominant right thyroid  nodule measuring 1.2 cm and left thyroid  nodule measuring 1.0 cm.  Reassuring.  These nodules can be monitored with serial ultrasound.  No plan for biopsy at this time.    TSH was 1.8 done by PCP in 12/23  Interval history  CGM data as reviewed above.  Mostly acceptable blood sugar  with no hypoglycemia.  He is on relatively high dose of insulin .  Diabetes regimen as reviewed as noted above.  Hemoglobin A1c 6.8%.  Body weight is relatively stable.  He has been tolerating Mounjaro  on 5 mg dose.  No other complaints today.  REVIEW OF SYSTEMS As per history of present illness.   PAST MEDICAL HISTORY: Past Medical History:  Diagnosis Date   Allergy    1-2 x a year    Aortic valve sclerosis    echo june 2009   Arthritis    fingers, back    CAD (coronary artery disease)    a. 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 b. s/p CABG 04/2011.   Carotid artery disease    Doppler, preop, September, 2012,  mild bilateral plaque with no significant stenoses   Diabetes mellitus     Dizziness    June, 2013   Ejection fraction    a. Ejection fraction 60%, echo, June, 2009. b. EF preserved by cath 2012.   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  05/01/2011   pilonidal cystectomy     POLYPECTOMY     TIBIA FRACTURE SURGERY      ALLERGIES: Allergies  Allergen Reactions   Atorvastatin  Other (See Comments)   Rosuvastatin Other (See Comments)    REACTION: myalgias    FAMILY HISTORY:  Family History  Problem Relation Age of Onset   Heart attack Paternal Grandfather        late 27s   Hearing loss Paternal Grandfather    Hypertension Father    Heart attack Father 45       bypass   Diabetes Father    Hearing loss Father    Thyroid  disease Father    Cancer Maternal Grandfather        site unknown   Prostate cancer Maternal Grandfather    Diabetes Paternal Aunt        X 3   Heart failure Maternal Grandmother    Diabetes Mother    Diabetes Paternal Uncle    Stroke Neg Hx    Colon cancer Neg Hx    Colon polyps Neg Hx    Esophageal cancer Neg Hx    Rectal cancer Neg Hx    Stomach cancer Neg Hx     SOCIAL HISTORY: Social History   Socioeconomic History   Marital status: Single     Spouse name: Not on file   Number of children: Not  on file   Years of education: Not on file   Highest education level: Not on file  Occupational History   Occupation: Emergency planning/management officer   Tobacco Use   Smoking status: Never   Smokeless tobacco: Never  Substance and Sexual Activity   Alcohol  use: No   Drug use: No   Sexual activity: Not on file  Other Topics Concern   Not on file  Social History Narrative   Not on file   Social Drivers of Health   Tobacco Use: Low Risk (07/17/2024)   Patient History    Smoking Tobacco Use: Never    Smokeless Tobacco Use: Never    Passive Exposure: Not on file  Financial Resource Strain: Not on file  Food Insecurity: Not on file  Transportation Needs: Not on file  Physical Activity: Not on file  Stress: Not on file  Social Connections: Unknown (12/16/2021)   [facility]   Social Network    Social Network: Not on file  Depression (PHQ2-9): Not on file  Alcohol  Screen: Not on file  Housing: Not on file  Utilities: Not on file  Health Literacy: Not on file    MEDICATIONS:  Current Outpatient Medications  Medication Sig Dispense Refill   aspirin 81 MG tablet Take 81 mg by mouth daily.     atorvastatin  (LIPITOR) 40 MG tablet TAKE 1 TABLET BY MOUTH EVERY DAY 90 tablet 3   Continuous Glucose Receiver (FREESTYLE LIBRE 14 DAY READER) DEVI by Does not apply route.     Continuous Glucose Sensor (FREESTYLE LIBRE 14 DAY SENSOR) MISC by Does not apply route.     Continuous Glucose Sensor (FREESTYLE LIBRE 3 PLUS SENSOR) MISC 1 each by Does not apply route continuous. Change every 15 days. 6 each 3   FARXIGA  10 MG TABS tablet 1/2 tablet Monday/Wednesday/Friday Orally Once a day 30 tablet 3   fenofibrate  micronized (LOFIBRA) 200 MG capsule TAKE 1 CAPSULE BY MOUTH EVERY DAY 90 capsule 1   glucose blood (ONETOUCH VERIO) test strip USE AS DIRECTED TO TEST SUGAR ONCE DAILY 100 strip 0   glucose blood test strip USE TO TEST BLOOD SUGAR TWICE  DAILY DX CODE E11.65 100 each 6   hydrochlorothiazide  (HYDRODIURIL ) 25 MG tablet TAKE 1 TABLET (25 MG TOTAL) BY MOUTH DAILY. 90 tablet 3   insulin  degludec (TRESIBA  FLEXTOUCH) 200 UNIT/ML FlexTouch Pen INJECT 110 UNITS UNDER THE SKIN DAILY WITH SUPPER 45 mL 3   Insulin  Pen Needle (BD PEN NEEDLE MICRO U/F) 32G X 6 MM MISC USE TO INJECT INSULIN  TWICE DAILY 100 each 2   Insulin  Pen Needle (PEN NEEDLES) 32G X 6 MM MISC USE TO INJECT INSULIN  TWICE DAILY 100 each 3   insulin  regular human CONCENTRATED (HUMULIN  R U-500 KWIKPEN) 500 UNIT/ML KwikPen INJECT 45 UNITS AT BREAKFAST, 30-40 UNITS AT LUNCH, AND 50-60 UNITS AT DINNER 12 mL 4   Lancets (ONETOUCH DELICA PLUS LANCET30G) MISC USE ONCE DAILY TO TEST BLOOD SUGAR 100 each 2   NOVOFINE PEN NEEDLE 32G X 6 MM MISC USE TO INJECT INSULIN  TWICE DAILY. 100 each 3   Omega-3 Fatty Acids (FISH OIL PO) Take by mouth.     ramipril  (ALTACE ) 5 MG capsule TAKE 1 CAPSULE(5 MG) BY MOUTH TWICE DAILY 180 capsule 1   tirzepatide  (MOUNJARO ) 5 MG/0.5ML Pen Inject 5 mg into the skin once a week. 6 mL 4   metFORMIN  (GLUCOPHAGE -XR) 500 MG 24 hr tablet TAKE 4 TABLETS BY MOUTH DAILY WITH SUPPER  360 tablet 3   Current Facility-Administered Medications  Medication Dose Route Frequency Provider Last Rate Last Admin   0.9 %  sodium chloride  infusion  500 mL Intravenous Continuous Danis, Victory CROME III, MD        PHYSICAL EXAM: Vitals:   07/17/24 1405  BP: 134/80  Pulse: 71  Resp: 16  SpO2: 97%  Weight: 222 lb (100.7 kg)  Height: 5' 8 (1.727 m)      Body mass index is 33.75 kg/m.  Wt Readings from Last 3 Encounters:  07/17/24 222 lb (100.7 kg)  04/17/24 214 lb (97.1 kg)  01/10/24 222 lb 12.8 oz (101.1 kg)    General: Well developed, well nourished male in no apparent distress.  HEENT: AT/[place], no external lesions.  Eyes: Conjunctiva clear and no icterus. Neck: Neck supple.  No thyromegaly.  Left thyroid  nodule palpable. Lungs: Respirations not labored Neurologic:  Alert, oriented, normal speech Extremities / Skin: Dry.  Psychiatric: Does not appear depressed or anxious  Diabetic Foot Exam - Simple   Simple Foot Form Diabetic Foot exam was performed with the following findings: Yes 07/17/2024  2:12 PM  Visual Inspection No deformities, no ulcerations, no other skin breakdown bilaterally: Yes Sensation Testing Intact to touch and monofilament testing bilaterally: Yes Pulse Check Posterior Tibialis and Dorsalis pulse intact bilaterally: Yes Comments     LABS Reviewed Lab Results  Component Value Date   HGBA1C 6.8 (A) 07/17/2024   HGBA1C 6.6 (A) 04/17/2024   HGBA1C 6.5 (A) 01/10/2024   Lab Results  Component Value Date   FRUCTOSAMINE 278 05/08/2016   FRUCTOSAMINE 311 (H) 01/15/2016   Lab Results  Component Value Date   CHOL 110 01/10/2024   HDL 32 (L) 01/10/2024   LDLCALC 47 01/10/2024   LDLDIRECT 46.0 12/23/2022   TRIG 263 (H) 01/10/2024   CHOLHDL 3.4 01/10/2024   Lab Results  Component Value Date   MICRALBCREAT 3 01/10/2024   MICRALBCREAT 1.6 02/20/2010   Lab Results  Component Value Date   CREATININE 0.56 (L) 01/10/2024   Lab Results  Component Value Date   GFR 95.05 12/23/2022    ASSESSMENT / PLAN  1. Type 2 diabetes mellitus with hyperglycemia, with long-term current use of insulin  (HCC)   2. Thyroid  nodule   3. Controlled type 2 diabetes mellitus with complication, with long-term current use of insulin  (HCC)      Diabetes Mellitus type 2, complicated by diabetic neuropathy/CAD. - Diabetic status / severity: Fair controlled.  Lab Results  Component Value Date   HGBA1C 6.8 (A) 07/17/2024    - Hemoglobin A1c goal : <6.5%  Patient has random hyperglycemia, especially around noon time.  Discussed about timing of using Humulin  R U-500, he has been taking 30 minutes before eating.   - Medications: See below.  No changes today.  Consider increasing Mounjaro  to 7.5 mg weekly.  He has 7.5 mg pen at home, advised  to try after the holidays and if he can tolerate he can continue on that dose.  If he increase Mounjaro  dose advised to decrease insulin  both Tresiba  and Humalog  by 10 units for each time.  For now: Continue Mounjaro  5 mg weekly. Continue Tresiba  124 units daily.   Adjust Humulin  U500 R 30 - 50 units with meals three times a day.  He feels comfortable adjusting dose of Humulin  R. Farxiga  5 mg every other day.   Metformin  extended release 2000 mg daily.   - Home glucose testing: CGM and check  as needed.  Freestyle libre 3+.  Check as needed.  - Discussed/ Gave Hypoglycemia treatment plan.  # Consult : not required at this time.   # Annual urine for microalbuminuria/ creatinine ratio, no microalbuminuria currently, continue ACE/ARB /ramipril .  Last  Lab Results  Component Value Date   MICRALBCREAT 3 01/10/2024    # Foot check nightly / neuropathy.  Discussed about gabapentin or Lyrica, patient does not want to take medications.  # Annual dilated diabetic eye exams.   - Diet: Make healthy diabetic food choices - Life style / activity / exercise: Discussed.  2. Blood pressure  -  BP Readings from Last 1 Encounters:  07/17/24 134/80    - Control is in target.  - No change in current plans.  3. Lipid status / Hyperlipidemia - Last  Lab Results  Component Value Date   LDLCALC 47 01/10/2024   - Continue atorvastatin  40 mg daily.  On fenofibrate .  On Feosol.  Managed by PCP.  # Left thyroid  nodule -Ultrasound in 2016 and 2017 left thyroid  nodule dominant measuring 2.4 cm. - Ultrasound thyroid  in June 2025 reviewed bilateral multiple thyroid  nodules with dominant right thyroid  nodule measuring 1.2 cm and left thyroid  nodule measuring 1.0 cm.  Reassuring.  These nodules can be monitored with serial ultrasound.  No plan for biopsy at this time.  Will repeat ultrasound thyroid  in 1 to 2 years.   Diagnoses and all orders for this visit:  Type 2 diabetes mellitus with  hyperglycemia, with long-term current use of insulin  (HCC) -     POCT glycosylated hemoglobin (Hb A1C)  Thyroid  nodule  Controlled type 2 diabetes mellitus with complication, with long-term current use of insulin  (HCC) -     metFORMIN  (GLUCOPHAGE -XR) 500 MG 24 hr tablet; TAKE 4 TABLETS BY MOUTH DAILY WITH SUPPER    DISPOSITION Follow up in clinic in 3 months suggested.    All questions answered and patient verbalized understanding of the plan.  [name] Mundie, MD [facility]: [facility] [address] [place], [address] [place], [place] [address] Phone # [phone]  At least part of this note was generated using voice recognition software. Inadvertent word errors may have occurred, which were not recognized during the proofreading process.

## 2024-10-16 ENCOUNTER — Ambulatory Visit: Admitting: Endocrinology
